# Patient Record
Sex: Female | Born: 1935 | Race: White | Hispanic: No | Smoking: Never smoker
Health system: Southern US, Community
[De-identification: ages and names within clinical notes are randomized; demographics above are authoritative.]

## PROBLEM LIST (undated history)

## (undated) DIAGNOSIS — I519 Heart disease, unspecified: Secondary | ICD-10-CM

## (undated) DIAGNOSIS — F419 Anxiety disorder, unspecified: Secondary | ICD-10-CM

## (undated) DIAGNOSIS — K8020 Calculus of gallbladder without cholecystitis without obstruction: Secondary | ICD-10-CM

## (undated) DIAGNOSIS — D649 Anemia, unspecified: Secondary | ICD-10-CM

## (undated) DIAGNOSIS — K219 Gastro-esophageal reflux disease without esophagitis: Secondary | ICD-10-CM

## (undated) DIAGNOSIS — M810 Age-related osteoporosis without current pathological fracture: Secondary | ICD-10-CM

## (undated) DIAGNOSIS — I10 Essential (primary) hypertension: Secondary | ICD-10-CM

## (undated) DIAGNOSIS — E559 Vitamin D deficiency, unspecified: Secondary | ICD-10-CM

## (undated) DIAGNOSIS — E785 Hyperlipidemia, unspecified: Secondary | ICD-10-CM

## (undated) DIAGNOSIS — G301 Alzheimer's disease with late onset: Secondary | ICD-10-CM

## (undated) DIAGNOSIS — E039 Hypothyroidism, unspecified: Secondary | ICD-10-CM

## (undated) DIAGNOSIS — K831 Obstruction of bile duct: Secondary | ICD-10-CM

## (undated) DIAGNOSIS — F0281 Dementia in other diseases classified elsewhere with behavioral disturbance: Secondary | ICD-10-CM

## (undated) DIAGNOSIS — F329 Major depressive disorder, single episode, unspecified: Secondary | ICD-10-CM

## (undated) DIAGNOSIS — F0390 Unspecified dementia without behavioral disturbance: Secondary | ICD-10-CM

## (undated) DIAGNOSIS — R42 Dizziness and giddiness: Secondary | ICD-10-CM

## (undated) DIAGNOSIS — C801 Malignant (primary) neoplasm, unspecified: Secondary | ICD-10-CM

## (undated) DIAGNOSIS — R609 Edema, unspecified: Secondary | ICD-10-CM

## (undated) HISTORY — PX: APPENDECTOMY: SHX54

## (undated) HISTORY — DX: Vitamin D deficiency, unspecified: E55.9

## (undated) HISTORY — DX: Essential (primary) hypertension: I10

## (undated) HISTORY — DX: Edema, unspecified: R60.9

## (undated) HISTORY — DX: Alzheimer's disease with late onset: G30.1

## (undated) HISTORY — DX: Dizziness and giddiness: R42

## (undated) HISTORY — DX: Unspecified dementia, unspecified severity, without behavioral disturbance, psychotic disturbance, mood disturbance, and anxiety: F03.90

## (undated) HISTORY — PX: ABDOMINAL HYSTERECTOMY: SHX81

## (undated) HISTORY — DX: Heart disease, unspecified: I51.9

## (undated) HISTORY — DX: Anemia, unspecified: D64.9

## (undated) HISTORY — PX: BREAST EXCISIONAL BIOPSY: SUR124

## (undated) HISTORY — DX: Major depressive disorder, single episode, unspecified: F32.9

## (undated) HISTORY — PX: JOINT REPLACEMENT: SHX530

## (undated) HISTORY — DX: Anxiety disorder, unspecified: F41.9

## (undated) HISTORY — DX: Hypothyroidism, unspecified: E03.9

## (undated) HISTORY — PX: CATARACT EXTRACTION: SUR2

## (undated) HISTORY — PX: OTHER SURGICAL HISTORY: SHX169

## (undated) HISTORY — DX: Age-related osteoporosis without current pathological fracture: M81.0

## (undated) HISTORY — PX: SPINE SURGERY: SHX786

## (undated) HISTORY — DX: Calculus of gallbladder without cholecystitis without obstruction: K80.20

## (undated) HISTORY — DX: Gastro-esophageal reflux disease without esophagitis: K21.9

## (undated) HISTORY — DX: Dementia in other diseases classified elsewhere with behavioral disturbance: F02.81

## (undated) HISTORY — PX: OOPHORECTOMY: SHX86

## (undated) HISTORY — DX: Hyperlipidemia, unspecified: E78.5

## (undated) HISTORY — DX: Obstruction of bile duct: K83.1

---

## 1969-08-27 HISTORY — PX: BREAST LUMPECTOMY: SHX2

## 1993-08-27 HISTORY — PX: HERNIA REPAIR: SHX51

## 2002-03-27 HISTORY — PX: OTHER SURGICAL HISTORY: SHX169

## 2004-06-08 ENCOUNTER — Ambulatory Visit: Payer: Self-pay | Admitting: Pain Medicine

## 2004-06-22 ENCOUNTER — Ambulatory Visit: Payer: Self-pay | Admitting: Physician Assistant

## 2004-07-05 ENCOUNTER — Ambulatory Visit: Payer: Self-pay | Admitting: Physician Assistant

## 2004-07-11 ENCOUNTER — Ambulatory Visit: Payer: Self-pay | Admitting: Pain Medicine

## 2004-07-26 ENCOUNTER — Ambulatory Visit: Payer: Self-pay | Admitting: Physician Assistant

## 2004-08-31 ENCOUNTER — Ambulatory Visit: Payer: Self-pay | Admitting: Pain Medicine

## 2004-09-28 ENCOUNTER — Ambulatory Visit: Payer: Self-pay | Admitting: Physician Assistant

## 2004-10-30 ENCOUNTER — Ambulatory Visit: Payer: Self-pay | Admitting: Physician Assistant

## 2004-11-15 ENCOUNTER — Ambulatory Visit: Payer: Self-pay | Admitting: Physician Assistant

## 2004-11-21 ENCOUNTER — Emergency Department: Payer: Self-pay | Admitting: Emergency Medicine

## 2004-11-21 ENCOUNTER — Ambulatory Visit: Payer: Self-pay | Admitting: Pain Medicine

## 2004-12-07 ENCOUNTER — Ambulatory Visit: Payer: Self-pay | Admitting: Physician Assistant

## 2005-02-06 ENCOUNTER — Inpatient Hospital Stay: Payer: Self-pay | Admitting: Internal Medicine

## 2005-02-06 ENCOUNTER — Other Ambulatory Visit: Payer: Self-pay

## 2005-02-28 ENCOUNTER — Ambulatory Visit: Payer: Self-pay | Admitting: Physician Assistant

## 2005-04-17 ENCOUNTER — Ambulatory Visit: Payer: Self-pay | Admitting: Urology

## 2005-06-27 ENCOUNTER — Ambulatory Visit: Payer: Self-pay | Admitting: Physician Assistant

## 2005-07-09 ENCOUNTER — Ambulatory Visit: Payer: Self-pay | Admitting: Pain Medicine

## 2005-09-11 ENCOUNTER — Ambulatory Visit: Payer: Self-pay | Admitting: Physician Assistant

## 2005-09-18 ENCOUNTER — Ambulatory Visit: Payer: Self-pay | Admitting: Pain Medicine

## 2005-10-02 ENCOUNTER — Ambulatory Visit: Payer: Self-pay | Admitting: Physician Assistant

## 2005-10-09 ENCOUNTER — Ambulatory Visit: Payer: Self-pay | Admitting: Pain Medicine

## 2005-10-29 ENCOUNTER — Ambulatory Visit: Payer: Self-pay | Admitting: Physician Assistant

## 2006-02-28 ENCOUNTER — Ambulatory Visit: Payer: Self-pay | Admitting: Physician Assistant

## 2006-03-05 ENCOUNTER — Ambulatory Visit: Payer: Self-pay | Admitting: Ophthalmology

## 2006-03-14 ENCOUNTER — Ambulatory Visit: Payer: Self-pay | Admitting: Physician Assistant

## 2006-05-24 ENCOUNTER — Ambulatory Visit: Payer: Self-pay | Admitting: Physician Assistant

## 2006-08-14 ENCOUNTER — Ambulatory Visit: Payer: Self-pay | Admitting: Pain Medicine

## 2006-09-05 ENCOUNTER — Ambulatory Visit: Payer: Self-pay | Admitting: Physician Assistant

## 2006-09-23 ENCOUNTER — Ambulatory Visit: Payer: Self-pay | Admitting: Pain Medicine

## 2006-09-24 ENCOUNTER — Ambulatory Visit: Payer: Self-pay | Admitting: Pain Medicine

## 2006-09-30 ENCOUNTER — Ambulatory Visit: Payer: Self-pay | Admitting: Pain Medicine

## 2006-10-17 ENCOUNTER — Ambulatory Visit: Payer: Self-pay | Admitting: Physician Assistant

## 2007-01-27 ENCOUNTER — Ambulatory Visit: Payer: Self-pay | Admitting: Physician Assistant

## 2007-02-26 ENCOUNTER — Ambulatory Visit: Payer: Self-pay | Admitting: Unknown Physician Specialty

## 2007-04-23 ENCOUNTER — Ambulatory Visit: Payer: Self-pay | Admitting: Physician Assistant

## 2007-05-01 ENCOUNTER — Ambulatory Visit: Payer: Self-pay | Admitting: Pain Medicine

## 2007-05-05 ENCOUNTER — Ambulatory Visit: Payer: Self-pay | Admitting: Pain Medicine

## 2007-05-06 ENCOUNTER — Ambulatory Visit: Payer: Self-pay | Admitting: Pain Medicine

## 2007-05-21 ENCOUNTER — Ambulatory Visit: Payer: Self-pay | Admitting: Physician Assistant

## 2007-06-04 ENCOUNTER — Ambulatory Visit: Payer: Self-pay | Admitting: Pain Medicine

## 2007-06-05 ENCOUNTER — Ambulatory Visit: Payer: Self-pay | Admitting: Pain Medicine

## 2007-06-24 ENCOUNTER — Ambulatory Visit: Payer: Self-pay | Admitting: Pain Medicine

## 2007-07-09 ENCOUNTER — Ambulatory Visit: Payer: Self-pay | Admitting: Pain Medicine

## 2007-09-17 ENCOUNTER — Ambulatory Visit: Payer: Self-pay | Admitting: Physician Assistant

## 2007-10-06 ENCOUNTER — Ambulatory Visit: Payer: Self-pay | Admitting: Physician Assistant

## 2008-01-12 ENCOUNTER — Ambulatory Visit: Payer: Self-pay | Admitting: Pain Medicine

## 2008-03-09 ENCOUNTER — Ambulatory Visit: Payer: Self-pay | Admitting: Unknown Physician Specialty

## 2008-03-09 ENCOUNTER — Other Ambulatory Visit: Payer: Self-pay

## 2008-03-17 ENCOUNTER — Inpatient Hospital Stay: Payer: Self-pay | Admitting: Unknown Physician Specialty

## 2008-05-26 ENCOUNTER — Inpatient Hospital Stay: Payer: Self-pay | Admitting: Internal Medicine

## 2008-05-26 ENCOUNTER — Other Ambulatory Visit: Payer: Self-pay

## 2008-06-14 ENCOUNTER — Ambulatory Visit: Payer: Self-pay | Admitting: Unknown Physician Specialty

## 2008-07-16 ENCOUNTER — Inpatient Hospital Stay: Payer: Self-pay | Admitting: Unknown Physician Specialty

## 2008-11-30 ENCOUNTER — Ambulatory Visit: Payer: Self-pay | Admitting: Internal Medicine

## 2010-02-21 ENCOUNTER — Ambulatory Visit: Payer: Self-pay

## 2011-01-12 ENCOUNTER — Ambulatory Visit: Payer: Self-pay | Admitting: Anesthesiology

## 2012-02-21 ENCOUNTER — Ambulatory Visit: Payer: Self-pay | Admitting: Internal Medicine

## 2013-02-23 ENCOUNTER — Emergency Department: Payer: Self-pay | Admitting: Emergency Medicine

## 2013-02-23 LAB — CBC
HCT: 34.8 % — ABNORMAL LOW (ref 35.0–47.0)
HGB: 11.6 g/dL — ABNORMAL LOW (ref 12.0–16.0)
MCH: 31.2 pg (ref 26.0–34.0)
MCV: 94 fL (ref 80–100)
RBC: 3.7 10*6/uL — ABNORMAL LOW (ref 3.80–5.20)
WBC: 6.6 10*3/uL (ref 3.6–11.0)

## 2013-02-23 LAB — COMPREHENSIVE METABOLIC PANEL
Albumin: 3.4 g/dL (ref 3.4–5.0)
BUN: 23 mg/dL — ABNORMAL HIGH (ref 7–18)
Calcium, Total: 8.6 mg/dL (ref 8.5–10.1)
Creatinine: 1.01 mg/dL (ref 0.60–1.30)
EGFR (Non-African Amer.): 50 — ABNORMAL LOW
Glucose: 97 mg/dL (ref 65–99)
Osmolality: 292 (ref 275–301)
Potassium: 4.8 mmol/L (ref 3.5–5.1)
SGOT(AST): 16 U/L (ref 15–37)
SGPT (ALT): 14 U/L (ref 12–78)
Sodium: 145 mmol/L (ref 136–145)

## 2013-02-23 LAB — SALICYLATE LEVEL: Salicylates, Serum: <1.7 mg/dL

## 2013-02-23 LAB — ETHANOL
Ethanol %: <0.003 % (ref 0.000–0.080)
Ethanol: <3 mg/dL

## 2013-02-23 LAB — TSH: Thyroid Stimulating Horm: 1.24 u[IU]/mL

## 2013-02-24 LAB — URINALYSIS, COMPLETE
Bacteria: NONE SEEN
Bilirubin,UR: NEGATIVE
Glucose,UR: NEGATIVE mg/dL (ref 0–75)
Ketone: NEGATIVE
Nitrite: NEGATIVE
Ph: 5 (ref 4.5–8.0)
Protein: NEGATIVE
RBC,UR: 5 /HPF (ref 0–5)
WBC UR: 22 /HPF (ref 0–5)

## 2013-02-24 LAB — DRUG SCREEN, URINE
Barbiturates, Ur Screen: NEGATIVE (ref ?–200)
Cocaine Metabolite,Ur ~~LOC~~: NEGATIVE (ref ?–300)
MDMA (Ecstasy)Ur Screen: NEGATIVE (ref ?–500)
Opiate, Ur Screen: POSITIVE (ref ?–300)
Phencyclidine (PCP) Ur S: NEGATIVE (ref ?–25)
Tricyclic, Ur Screen: NEGATIVE (ref ?–1000)

## 2014-03-02 DIAGNOSIS — R609 Edema, unspecified: Secondary | ICD-10-CM | POA: Insufficient documentation

## 2014-03-02 DIAGNOSIS — R42 Dizziness and giddiness: Secondary | ICD-10-CM

## 2014-03-02 HISTORY — DX: Dizziness and giddiness: R42

## 2014-03-02 HISTORY — DX: Edema, unspecified: R60.9

## 2014-12-17 NOTE — Consult Note (Signed)
PATIENT NAME:  Kerri Rangel, Kerri Rangel MR#:  [id number] DATE OF BIRTH:  1924-08-01  DATE OF CONSULTATION:  02/24/2013  REFERRING PHYSICIAN:  Lavonia Drafts, MD CONSULTING PHYSICIAN:  Cordelia Pen. Gretel Acre, MD  REASON FOR ADMISSION:  Crying with eyes closed.   HISTORY OF PRESENT ILLNESS: The patient is an 81 year old recently widowed Caucasian female who presented to the ED with her 2 adult children. The patient has been severely depressed and was crying and sobbing as she lost her husband recently, 6 weeks ago, and after that her daughter passed away 2 days ago. The patient is still grieving and has been sick for a while. The patient was accompanied by her family members including her son and 2 granddaughters. She was evaluated in the presence of her son. Initial history was obtained from her son. He reported that the patient was very sad and was crying yesterday after the death of her daughter. She was present and kissed her goodbye, but after that she was crying and yelling and she locked herself in the room. She locked all the doors and refused to come out and they had to call the law to open the doors. She flipped out and going around the house and was telling that nobody told her about her daughter dying.  The patient was yelling and was crying out loud and was uncontrollable. The patient was also calling for her deceased husband.   During my interview, the patient continued to be crying and was telling that she wants to die and she wants to be with her daughter and with her husband. She reported that she does not want to live anymore. She stated that she is feeling very sad and depressed at this time. Her son reported that the patient has been feeling very depressed for the past few weeks and she has lost her appetite and she has not been eating well.  Her granddaughters are also concerned about her state of health. They reported that she was started on medications by her primary care physician, Dr. Fannie Knee, where she has been noncompliant with the medications. She is not currently improving and they want her to be admitted to a psychiatric hospital at this time.   PAST PSYCHIATRIC HISTORY: They reported that the patient was admitted to the hospital in [place] when she was young, in the 80s.  They do not know what medication she was prescribed at that time. The patient is currently taking sertraline, Seroquel and alprazolam. She is not improving on the current medication. The patient continued to report that she is having thoughts to hurt herself and she wants to die and she wants to be with her husband.   CURRENT HOME MEDICATIONS: 1.  Vicodin 1 tablet every 8 hours as needed for pain. 2.  Simvastatin 40 mg daily. 3.  Omeprazole 20 mg at bedtime.  4.  Atenolol 50 mg daily. 5.  Meloxicam 7.5 mg every 12 hours. 6.  Seroquel 12.5 mg daily. 7.  Alprazolam 0.5 mg p.o. b.i.d.  8.  Levothyroxine 25 mg daily. 9.  Sertraline 100 mg daily.  MEDICAL HISTORY:  Hypercholesterolemia, GERD, hypertension and hypothyroidism.   ALLERGIES: MORPHINE, ZANAFLEX.   SOCIAL HISTORY: The patient recently lost her husband as well as her older daughter. She has 2 other children. She is very close to the family. She currently lives by herself, but her daughter spends 6 nights with her as well as her son spends 1 night with her. Her son reported that  her daughter is also very stressed out because she is spending 6 night with the mother and then she has to take care of her family. They are considering placing her in independent living. Her daughter has the power of attorney for the patient. The patient has never attempted suicide in the past.   ANCILLARY DATA:  Temperature 98.4, pulse 71, respirations 20, blood pressure 179/74.   LABORATORY DATA:  Glucose 97, BUN 23, creatinine 1.01, sodium 145, potassium 4.8, chloride 114, bicarbonate 26, anion gap 5, osmolality 292, calcium 8.6. Blood alcohol level less than 3.  Protein 6.5, albumin 3.4, bilirubin 0.4, alkaline phosphatase 59, AST 16, ALT 14. TSH 1.24. Urine drug screen positive for benzodiazepine and opioids.  RBC 3.7, hemoglobin 11.6, hematocrit 34.8.   REVIEW OF SYSTEMS:  GENERAL:  She denied having any pains. The patient was constantly crying. No cough noted.  GENITOURINARY:  No incontinence noted.   MENTAL STATUS EXAMINATION:  The patient is an elderly looking female who appeared her stated age. She was crying and sobbing constantly. She maintained poor eye contact. Her speech was low in tone and volume. Mood was depressed and anxious. Affect was congruent. Thought process was logical. Thought content was nondelusional. She demonstrated poor insight and judgment. She was saying that she is going to kill herself and wants to be with her husband and her deceased daughter.   DIAGNOSTIC IMPRESSION: AXIS I:  1.  Major depressive disorder, severe, without psychotic features.  2.  Bereavement. AXIS II: None.  AXIS III: Please review the medical history.   TREATMENT PLAN: The patient will be started on the following medications: 1.  Sertraline 100 mg p.o. q. a.m.  2.  Seroquel 25 mg p.o. b.i.d.  3.  Xanax 0.25 mg p.o. at bedtime.   She will be referred to the inpatient gero-psych unit for admission if the bed will be available. I also discussed with the family about starting her on therapy for bereavement as she is going through the grief process and we all agreed with the plan. They will also look into placement for independent living at this time.   Thank you for allowing me to participate in the care of this patient.  ____________________________ Cordelia Pen. Gretel Acre, MD usf:sb D: 02/24/2013 13:57:25 ET T: 02/24/2013 14:29:28 ET JOB#: [id number]  cc: Cordelia Pen. Gretel Acre, MD, <Dictator> Jeronimo Norma MD ELECTRONICALLY SIGNED 03/05/2013 9:54

## 2014-12-23 ENCOUNTER — Emergency Department: Admit: 2014-12-23 | Disposition: A | Discharge status: Home / Self Care | Payer: Self-pay | Admitting: Internal Medicine

## 2014-12-23 LAB — URINALYSIS, COMPLETE
BILIRUBIN, UR: NEGATIVE
GLUCOSE, UR: NEGATIVE mg/dL (ref 0–75)
Ketone: NEGATIVE
Nitrite: POSITIVE
PH: 6 (ref 4.5–8.0)
Protein: NEGATIVE
RBC,UR: NONE SEEN /HPF (ref 0–5)
SPECIFIC GRAVITY: 1.009 (ref 1.003–1.030)

## 2014-12-23 LAB — COMPREHENSIVE METABOLIC PANEL
ALBUMIN: 3.6 g/dL
ANION GAP: 8 (ref 7–16)
AST: 24 U/L
Alkaline Phosphatase: 62 U/L
BUN: 16 mg/dL
Bilirubin,Total: 0.4 mg/dL
CALCIUM: 9.1 mg/dL
CHLORIDE: 109 mmol/L
Co2: 26 mmol/L
Creatinine: 0.94 mg/dL
EGFR (African American): >60
EGFR (Non-African Amer.): 54 — ABNORMAL LOW
GLUCOSE: 125 mg/dL — AB
Potassium: 4.2 mmol/L
SGPT (ALT): 10 U/L — ABNORMAL LOW
Sodium: 143 mmol/L
Total Protein: 6.4 g/dL — ABNORMAL LOW

## 2014-12-23 LAB — CBC
HCT: 39.3 % (ref 35.0–47.0)
HGB: 12.7 g/dL (ref 12.0–16.0)
MCH: 29.9 pg (ref 26.0–34.0)
MCHC: 32.2 g/dL (ref 32.0–36.0)
MCV: 93 fL (ref 80–100)
Platelet: 184 10*3/uL (ref 150–440)
RBC: 4.24 10*6/uL (ref 3.80–5.20)
RDW: 13.2 % (ref 11.5–14.5)
WBC: 6.2 10*3/uL (ref 3.6–11.0)

## 2014-12-23 LAB — TROPONIN I

## 2015-03-02 DIAGNOSIS — G301 Alzheimer's disease with late onset: Secondary | ICD-10-CM

## 2015-03-02 DIAGNOSIS — F0281 Dementia in other diseases classified elsewhere with behavioral disturbance: Secondary | ICD-10-CM

## 2015-03-02 DIAGNOSIS — F02818 Dementia in other diseases classified elsewhere, unspecified severity, with other behavioral disturbance: Secondary | ICD-10-CM

## 2015-03-02 HISTORY — DX: Alzheimer's disease with late onset: G30.1

## 2015-03-02 HISTORY — DX: Dementia in other diseases classified elsewhere with behavioral disturbance: F02.81

## 2015-03-02 HISTORY — DX: Dementia in other diseases classified elsewhere, unspecified severity, with other behavioral disturbance: F02.818

## 2015-03-04 ENCOUNTER — Other Ambulatory Visit: Payer: Self-pay | Admitting: Neurology

## 2015-03-04 DIAGNOSIS — R413 Other amnesia: Secondary | ICD-10-CM

## 2015-03-08 ENCOUNTER — Ambulatory Visit
Admission: RE | Admit: 2015-03-08 | Discharge: 2015-03-08 | Disposition: A | Discharge status: Home / Self Care | Payer: PPO | Source: Ambulatory Visit | Attending: Neurology | Admitting: Neurology

## 2015-03-08 DIAGNOSIS — R413 Other amnesia: Secondary | ICD-10-CM | POA: Insufficient documentation

## 2015-09-13 DIAGNOSIS — E785 Hyperlipidemia, unspecified: Secondary | ICD-10-CM | POA: Diagnosis not present

## 2015-09-13 DIAGNOSIS — I159 Secondary hypertension, unspecified: Secondary | ICD-10-CM | POA: Diagnosis not present

## 2015-09-13 DIAGNOSIS — E039 Hypothyroidism, unspecified: Secondary | ICD-10-CM | POA: Diagnosis not present

## 2015-09-19 ENCOUNTER — Other Ambulatory Visit: Payer: Self-pay | Admitting: Anesthesiology

## 2015-09-19 DIAGNOSIS — Z79899 Other long term (current) drug therapy: Secondary | ICD-10-CM | POA: Diagnosis not present

## 2015-09-19 DIAGNOSIS — J449 Chronic obstructive pulmonary disease, unspecified: Secondary | ICD-10-CM | POA: Diagnosis not present

## 2015-09-19 DIAGNOSIS — M79674 Pain in right toe(s): Secondary | ICD-10-CM | POA: Diagnosis not present

## 2015-09-19 DIAGNOSIS — F1120 Opioid dependence, uncomplicated: Secondary | ICD-10-CM | POA: Diagnosis not present

## 2015-09-19 DIAGNOSIS — R5383 Other fatigue: Secondary | ICD-10-CM | POA: Diagnosis not present

## 2015-09-19 DIAGNOSIS — F1920 Other psychoactive substance dependence, uncomplicated: Secondary | ICD-10-CM | POA: Diagnosis not present

## 2015-09-19 DIAGNOSIS — B351 Tinea unguium: Secondary | ICD-10-CM | POA: Diagnosis not present

## 2015-09-19 DIAGNOSIS — M5417 Radiculopathy, lumbosacral region: Secondary | ICD-10-CM | POA: Diagnosis not present

## 2015-09-19 DIAGNOSIS — M79675 Pain in left toe(s): Secondary | ICD-10-CM | POA: Diagnosis not present

## 2015-09-19 DIAGNOSIS — F329 Major depressive disorder, single episode, unspecified: Secondary | ICD-10-CM | POA: Diagnosis not present

## 2015-09-19 DIAGNOSIS — M47819 Spondylosis without myelopathy or radiculopathy, site unspecified: Secondary | ICD-10-CM

## 2015-09-19 DIAGNOSIS — M545 Low back pain: Secondary | ICD-10-CM | POA: Diagnosis not present

## 2015-09-19 DIAGNOSIS — G8929 Other chronic pain: Secondary | ICD-10-CM | POA: Diagnosis not present

## 2015-09-19 DIAGNOSIS — I10 Essential (primary) hypertension: Secondary | ICD-10-CM | POA: Diagnosis not present

## 2015-09-19 DIAGNOSIS — F4310 Post-traumatic stress disorder, unspecified: Secondary | ICD-10-CM | POA: Diagnosis not present

## 2015-09-20 DIAGNOSIS — G301 Alzheimer's disease with late onset: Secondary | ICD-10-CM | POA: Diagnosis not present

## 2015-09-20 DIAGNOSIS — R739 Hyperglycemia, unspecified: Secondary | ICD-10-CM | POA: Diagnosis not present

## 2015-09-20 DIAGNOSIS — F332 Major depressive disorder, recurrent severe without psychotic features: Secondary | ICD-10-CM | POA: Diagnosis not present

## 2015-09-20 DIAGNOSIS — R5382 Chronic fatigue, unspecified: Secondary | ICD-10-CM | POA: Diagnosis not present

## 2015-09-20 DIAGNOSIS — F0281 Dementia in other diseases classified elsewhere with behavioral disturbance: Secondary | ICD-10-CM | POA: Diagnosis not present

## 2015-09-20 DIAGNOSIS — E039 Hypothyroidism, unspecified: Secondary | ICD-10-CM | POA: Diagnosis not present

## 2015-09-22 ENCOUNTER — Ambulatory Visit: Payer: PPO

## 2015-09-28 ENCOUNTER — Ambulatory Visit: Admission: RE | Admit: 2015-09-28 | Payer: PPO | Source: Ambulatory Visit

## 2015-10-07 ENCOUNTER — Ambulatory Visit
Admission: RE | Admit: 2015-10-07 | Discharge: 2015-10-07 | Disposition: A | Discharge status: Home / Self Care | Payer: PPO | Source: Ambulatory Visit | Attending: Anesthesiology | Admitting: Anesthesiology

## 2015-10-07 DIAGNOSIS — M4806 Spinal stenosis, lumbar region: Secondary | ICD-10-CM | POA: Insufficient documentation

## 2015-10-07 DIAGNOSIS — M47819 Spondylosis without myelopathy or radiculopathy, site unspecified: Secondary | ICD-10-CM

## 2015-10-07 DIAGNOSIS — M4690 Unspecified inflammatory spondylopathy, site unspecified: Secondary | ICD-10-CM | POA: Diagnosis not present

## 2015-10-07 DIAGNOSIS — M5136 Other intervertebral disc degeneration, lumbar region: Secondary | ICD-10-CM | POA: Diagnosis not present

## 2015-11-04 DIAGNOSIS — E785 Hyperlipidemia, unspecified: Secondary | ICD-10-CM | POA: Diagnosis not present

## 2015-11-04 DIAGNOSIS — I10 Essential (primary) hypertension: Secondary | ICD-10-CM | POA: Diagnosis not present

## 2015-11-04 DIAGNOSIS — F329 Major depressive disorder, single episode, unspecified: Secondary | ICD-10-CM | POA: Diagnosis not present

## 2015-11-04 DIAGNOSIS — I6523 Occlusion and stenosis of bilateral carotid arteries: Secondary | ICD-10-CM | POA: Diagnosis not present

## 2015-11-21 DIAGNOSIS — F1120 Opioid dependence, uncomplicated: Secondary | ICD-10-CM | POA: Diagnosis not present

## 2015-11-21 DIAGNOSIS — G8929 Other chronic pain: Secondary | ICD-10-CM | POA: Diagnosis not present

## 2015-11-21 DIAGNOSIS — J449 Chronic obstructive pulmonary disease, unspecified: Secondary | ICD-10-CM | POA: Diagnosis not present

## 2015-11-21 DIAGNOSIS — R5383 Other fatigue: Secondary | ICD-10-CM | POA: Diagnosis not present

## 2015-11-21 DIAGNOSIS — F329 Major depressive disorder, single episode, unspecified: Secondary | ICD-10-CM | POA: Diagnosis not present

## 2015-11-21 DIAGNOSIS — M5417 Radiculopathy, lumbosacral region: Secondary | ICD-10-CM | POA: Diagnosis not present

## 2015-11-21 DIAGNOSIS — F1920 Other psychoactive substance dependence, uncomplicated: Secondary | ICD-10-CM | POA: Diagnosis not present

## 2015-11-21 DIAGNOSIS — I10 Essential (primary) hypertension: Secondary | ICD-10-CM | POA: Diagnosis not present

## 2015-11-21 DIAGNOSIS — F4310 Post-traumatic stress disorder, unspecified: Secondary | ICD-10-CM | POA: Diagnosis not present

## 2015-11-21 DIAGNOSIS — M545 Low back pain: Secondary | ICD-10-CM | POA: Diagnosis not present

## 2015-12-20 DIAGNOSIS — B351 Tinea unguium: Secondary | ICD-10-CM | POA: Diagnosis not present

## 2015-12-20 DIAGNOSIS — M79675 Pain in left toe(s): Secondary | ICD-10-CM | POA: Diagnosis not present

## 2015-12-20 DIAGNOSIS — M79674 Pain in right toe(s): Secondary | ICD-10-CM | POA: Diagnosis not present

## 2016-01-16 DIAGNOSIS — J449 Chronic obstructive pulmonary disease, unspecified: Secondary | ICD-10-CM | POA: Diagnosis not present

## 2016-01-16 DIAGNOSIS — R5383 Other fatigue: Secondary | ICD-10-CM | POA: Diagnosis not present

## 2016-01-16 DIAGNOSIS — M5417 Radiculopathy, lumbosacral region: Secondary | ICD-10-CM | POA: Diagnosis not present

## 2016-01-16 DIAGNOSIS — F4310 Post-traumatic stress disorder, unspecified: Secondary | ICD-10-CM | POA: Diagnosis not present

## 2016-01-16 DIAGNOSIS — M545 Low back pain: Secondary | ICD-10-CM | POA: Diagnosis not present

## 2016-01-16 DIAGNOSIS — F1920 Other psychoactive substance dependence, uncomplicated: Secondary | ICD-10-CM | POA: Diagnosis not present

## 2016-01-16 DIAGNOSIS — F329 Major depressive disorder, single episode, unspecified: Secondary | ICD-10-CM | POA: Diagnosis not present

## 2016-01-16 DIAGNOSIS — I10 Essential (primary) hypertension: Secondary | ICD-10-CM | POA: Diagnosis not present

## 2016-01-26 DIAGNOSIS — R400 Somnolence: Secondary | ICD-10-CM | POA: Diagnosis not present

## 2016-03-02 DIAGNOSIS — E785 Hyperlipidemia, unspecified: Secondary | ICD-10-CM | POA: Diagnosis not present

## 2016-03-02 DIAGNOSIS — E039 Hypothyroidism, unspecified: Secondary | ICD-10-CM | POA: Diagnosis not present

## 2016-03-02 DIAGNOSIS — F0281 Dementia in other diseases classified elsewhere with behavioral disturbance: Secondary | ICD-10-CM | POA: Diagnosis not present

## 2016-03-02 DIAGNOSIS — I159 Secondary hypertension, unspecified: Secondary | ICD-10-CM | POA: Diagnosis not present

## 2016-03-02 DIAGNOSIS — G301 Alzheimer's disease with late onset: Secondary | ICD-10-CM | POA: Diagnosis not present

## 2016-03-12 DIAGNOSIS — I10 Essential (primary) hypertension: Secondary | ICD-10-CM | POA: Diagnosis not present

## 2016-03-12 DIAGNOSIS — F1920 Other psychoactive substance dependence, uncomplicated: Secondary | ICD-10-CM | POA: Diagnosis not present

## 2016-03-12 DIAGNOSIS — R5383 Other fatigue: Secondary | ICD-10-CM | POA: Diagnosis not present

## 2016-03-12 DIAGNOSIS — F4310 Post-traumatic stress disorder, unspecified: Secondary | ICD-10-CM | POA: Diagnosis not present

## 2016-03-12 DIAGNOSIS — Z79899 Other long term (current) drug therapy: Secondary | ICD-10-CM | POA: Diagnosis not present

## 2016-03-12 DIAGNOSIS — J449 Chronic obstructive pulmonary disease, unspecified: Secondary | ICD-10-CM | POA: Diagnosis not present

## 2016-03-12 DIAGNOSIS — F329 Major depressive disorder, single episode, unspecified: Secondary | ICD-10-CM | POA: Diagnosis not present

## 2016-03-12 DIAGNOSIS — Z79891 Long term (current) use of opiate analgesic: Secondary | ICD-10-CM | POA: Diagnosis not present

## 2016-03-12 DIAGNOSIS — M545 Low back pain: Secondary | ICD-10-CM | POA: Diagnosis not present

## 2016-03-12 DIAGNOSIS — M5417 Radiculopathy, lumbosacral region: Secondary | ICD-10-CM | POA: Diagnosis not present

## 2016-03-12 DIAGNOSIS — F1120 Opioid dependence, uncomplicated: Secondary | ICD-10-CM | POA: Diagnosis not present

## 2016-03-30 DIAGNOSIS — E039 Hypothyroidism, unspecified: Secondary | ICD-10-CM | POA: Diagnosis not present

## 2016-03-30 DIAGNOSIS — I159 Secondary hypertension, unspecified: Secondary | ICD-10-CM | POA: Diagnosis not present

## 2016-03-30 DIAGNOSIS — E785 Hyperlipidemia, unspecified: Secondary | ICD-10-CM | POA: Diagnosis not present

## 2016-03-30 DIAGNOSIS — G301 Alzheimer's disease with late onset: Secondary | ICD-10-CM | POA: Diagnosis not present

## 2016-03-30 DIAGNOSIS — F0281 Dementia in other diseases classified elsewhere with behavioral disturbance: Secondary | ICD-10-CM | POA: Diagnosis not present

## 2016-04-10 DIAGNOSIS — I159 Secondary hypertension, unspecified: Secondary | ICD-10-CM | POA: Diagnosis not present

## 2016-04-10 DIAGNOSIS — F332 Major depressive disorder, recurrent severe without psychotic features: Secondary | ICD-10-CM | POA: Diagnosis not present

## 2016-04-10 DIAGNOSIS — G301 Alzheimer's disease with late onset: Secondary | ICD-10-CM | POA: Diagnosis not present

## 2016-04-10 DIAGNOSIS — E039 Hypothyroidism, unspecified: Secondary | ICD-10-CM | POA: Diagnosis not present

## 2016-04-10 DIAGNOSIS — F0281 Dementia in other diseases classified elsewhere with behavioral disturbance: Secondary | ICD-10-CM | POA: Diagnosis not present

## 2016-04-10 DIAGNOSIS — F419 Anxiety disorder, unspecified: Secondary | ICD-10-CM | POA: Diagnosis not present

## 2016-04-11 DIAGNOSIS — Z961 Presence of intraocular lens: Secondary | ICD-10-CM | POA: Diagnosis not present

## 2016-05-07 DIAGNOSIS — R5383 Other fatigue: Secondary | ICD-10-CM | POA: Diagnosis not present

## 2016-05-07 DIAGNOSIS — J449 Chronic obstructive pulmonary disease, unspecified: Secondary | ICD-10-CM | POA: Diagnosis not present

## 2016-05-07 DIAGNOSIS — F4310 Post-traumatic stress disorder, unspecified: Secondary | ICD-10-CM | POA: Diagnosis not present

## 2016-05-07 DIAGNOSIS — I10 Essential (primary) hypertension: Secondary | ICD-10-CM | POA: Diagnosis not present

## 2016-05-07 DIAGNOSIS — F1920 Other psychoactive substance dependence, uncomplicated: Secondary | ICD-10-CM | POA: Diagnosis not present

## 2016-05-07 DIAGNOSIS — M5417 Radiculopathy, lumbosacral region: Secondary | ICD-10-CM | POA: Diagnosis not present

## 2016-05-07 DIAGNOSIS — M545 Low back pain: Secondary | ICD-10-CM | POA: Diagnosis not present

## 2016-05-07 DIAGNOSIS — F329 Major depressive disorder, single episode, unspecified: Secondary | ICD-10-CM | POA: Diagnosis not present

## 2016-05-21 DIAGNOSIS — R05 Cough: Secondary | ICD-10-CM | POA: Diagnosis not present

## 2016-06-20 DIAGNOSIS — M79674 Pain in right toe(s): Secondary | ICD-10-CM | POA: Diagnosis not present

## 2016-06-20 DIAGNOSIS — B351 Tinea unguium: Secondary | ICD-10-CM | POA: Diagnosis not present

## 2016-06-20 DIAGNOSIS — M79675 Pain in left toe(s): Secondary | ICD-10-CM | POA: Diagnosis not present

## 2016-06-29 DIAGNOSIS — G301 Alzheimer's disease with late onset: Secondary | ICD-10-CM | POA: Diagnosis not present

## 2016-06-29 DIAGNOSIS — F0281 Dementia in other diseases classified elsewhere with behavioral disturbance: Secondary | ICD-10-CM | POA: Diagnosis not present

## 2016-07-02 DIAGNOSIS — F1920 Other psychoactive substance dependence, uncomplicated: Secondary | ICD-10-CM | POA: Diagnosis not present

## 2016-07-02 DIAGNOSIS — I10 Essential (primary) hypertension: Secondary | ICD-10-CM | POA: Diagnosis not present

## 2016-07-02 DIAGNOSIS — J449 Chronic obstructive pulmonary disease, unspecified: Secondary | ICD-10-CM | POA: Diagnosis not present

## 2016-07-02 DIAGNOSIS — F329 Major depressive disorder, single episode, unspecified: Secondary | ICD-10-CM | POA: Diagnosis not present

## 2016-07-02 DIAGNOSIS — M5417 Radiculopathy, lumbosacral region: Secondary | ICD-10-CM | POA: Diagnosis not present

## 2016-07-02 DIAGNOSIS — R5383 Other fatigue: Secondary | ICD-10-CM | POA: Diagnosis not present

## 2016-07-02 DIAGNOSIS — F4310 Post-traumatic stress disorder, unspecified: Secondary | ICD-10-CM | POA: Diagnosis not present

## 2016-07-02 DIAGNOSIS — M545 Low back pain: Secondary | ICD-10-CM | POA: Diagnosis not present

## 2016-07-10 DIAGNOSIS — R109 Unspecified abdominal pain: Secondary | ICD-10-CM | POA: Diagnosis not present

## 2016-07-10 DIAGNOSIS — G301 Alzheimer's disease with late onset: Secondary | ICD-10-CM | POA: Diagnosis not present

## 2016-07-10 DIAGNOSIS — I10 Essential (primary) hypertension: Secondary | ICD-10-CM | POA: Diagnosis not present

## 2016-07-10 DIAGNOSIS — N390 Urinary tract infection, site not specified: Secondary | ICD-10-CM | POA: Diagnosis not present

## 2016-07-10 DIAGNOSIS — F0281 Dementia in other diseases classified elsewhere with behavioral disturbance: Secondary | ICD-10-CM | POA: Diagnosis not present

## 2016-07-18 DIAGNOSIS — R109 Unspecified abdominal pain: Secondary | ICD-10-CM | POA: Diagnosis not present

## 2016-07-18 DIAGNOSIS — I10 Essential (primary) hypertension: Secondary | ICD-10-CM | POA: Diagnosis not present

## 2016-07-24 ENCOUNTER — Other Ambulatory Visit: Payer: Self-pay | Admitting: Infectious Diseases

## 2016-07-24 DIAGNOSIS — R1084 Generalized abdominal pain: Secondary | ICD-10-CM | POA: Diagnosis not present

## 2016-07-24 DIAGNOSIS — N390 Urinary tract infection, site not specified: Secondary | ICD-10-CM | POA: Diagnosis not present

## 2016-07-26 ENCOUNTER — Ambulatory Visit
Admission: RE | Admit: 2016-07-26 | Discharge: 2016-07-26 | Disposition: A | Discharge status: Home / Self Care | Payer: PPO | Source: Ambulatory Visit | Attending: Infectious Diseases | Admitting: Infectious Diseases

## 2016-07-26 DIAGNOSIS — I700 Atherosclerosis of aorta: Secondary | ICD-10-CM | POA: Insufficient documentation

## 2016-07-26 DIAGNOSIS — E279 Disorder of adrenal gland, unspecified: Secondary | ICD-10-CM | POA: Insufficient documentation

## 2016-07-26 DIAGNOSIS — N390 Urinary tract infection, site not specified: Secondary | ICD-10-CM | POA: Insufficient documentation

## 2016-07-26 DIAGNOSIS — K8011 Calculus of gallbladder with chronic cholecystitis with obstruction: Secondary | ICD-10-CM | POA: Diagnosis not present

## 2016-07-26 DIAGNOSIS — R1084 Generalized abdominal pain: Secondary | ICD-10-CM

## 2016-07-26 DIAGNOSIS — C189 Malignant neoplasm of colon, unspecified: Secondary | ICD-10-CM | POA: Diagnosis not present

## 2016-07-26 HISTORY — DX: Malignant (primary) neoplasm, unspecified: C80.1

## 2016-07-26 MED ORDER — IOPAMIDOL (ISOVUE-300) INJECTION 61%
75.0000 mL | Freq: Once | INTRAVENOUS | Status: AC | PRN
  Administered 2016-07-26: 75 mL via INTRAVENOUS

## 2016-08-01 DIAGNOSIS — K8020 Calculus of gallbladder without cholecystitis without obstruction: Secondary | ICD-10-CM

## 2016-08-01 DIAGNOSIS — K831 Obstruction of bile duct: Secondary | ICD-10-CM

## 2016-08-01 HISTORY — DX: Calculus of gallbladder without cholecystitis without obstruction: K80.20

## 2016-08-01 HISTORY — DX: Obstruction of bile duct: K83.1

## 2016-08-07 ENCOUNTER — Other Ambulatory Visit: Payer: Self-pay

## 2016-08-07 ENCOUNTER — Ambulatory Visit (INDEPENDENT_AMBULATORY_CARE_PROVIDER_SITE_OTHER): Payer: PPO | Admitting: Gastroenterology

## 2016-08-07 ENCOUNTER — Encounter: Payer: Self-pay | Admitting: Gastroenterology

## 2016-08-07 VITALS — BP 156/71 | HR 65 | Temp 97.6°F | Ht 63.0 in | Wt 158.0 lb

## 2016-08-07 DIAGNOSIS — K8050 Calculus of bile duct without cholangitis or cholecystitis without obstruction: Secondary | ICD-10-CM

## 2016-08-07 DIAGNOSIS — F419 Anxiety disorder, unspecified: Secondary | ICD-10-CM

## 2016-08-07 DIAGNOSIS — I10 Essential (primary) hypertension: Secondary | ICD-10-CM

## 2016-08-07 DIAGNOSIS — F32A Depression, unspecified: Secondary | ICD-10-CM

## 2016-08-07 DIAGNOSIS — F329 Major depressive disorder, single episode, unspecified: Secondary | ICD-10-CM | POA: Insufficient documentation

## 2016-08-07 DIAGNOSIS — F0390 Unspecified dementia without behavioral disturbance: Secondary | ICD-10-CM | POA: Insufficient documentation

## 2016-08-07 DIAGNOSIS — R110 Nausea: Secondary | ICD-10-CM

## 2016-08-07 DIAGNOSIS — E559 Vitamin D deficiency, unspecified: Secondary | ICD-10-CM | POA: Insufficient documentation

## 2016-08-07 DIAGNOSIS — E039 Hypothyroidism, unspecified: Secondary | ICD-10-CM | POA: Insufficient documentation

## 2016-08-07 DIAGNOSIS — E785 Hyperlipidemia, unspecified: Secondary | ICD-10-CM | POA: Insufficient documentation

## 2016-08-07 HISTORY — DX: Hyperlipidemia, unspecified: E78.5

## 2016-08-07 HISTORY — DX: Essential (primary) hypertension: I10

## 2016-08-07 HISTORY — DX: Anxiety disorder, unspecified: F41.9

## 2016-08-07 HISTORY — DX: Depression, unspecified: F32.A

## 2016-08-07 HISTORY — DX: Hypothyroidism, unspecified: E03.9

## 2016-08-07 NOTE — Progress Notes (Signed)
Gastroenterology Consultation  Referring Provider:     Leonel Ramsay, MD Primary Care Physician:  Leonel Ramsay, MD Primary Gastroenterologist:  Dr. Allen Norris     Reason for Consultation:     Common bile duct stones        HPI:   Kerri Rangel is a 82 y.o. y/o female referred for consultation & management of Common bile duct stones by Dr. Ola Spurr, DAVID Mamie Nick, MD.  This patient comes to me from Dr. Vira Agar after the patient was to him by Dr. Ola Spurr for common Bile duct stones.  The patient and her children come today with a report of the patient having some nausea. The patient has some dementia and is not able to give much history.  The patient's daughter states that she has been gaining weight despite having nausea and she is on pain medication for her back.  The patient was having increased abdominal pain when the family try to wean the patient off of her back pain medication.  Shortly after that the patient started to have worsening of her abdominal pain.  When they went back up on the pain medication the patient stopped reporting any further abdominal pain but still has some nausea.  Past Medical History:  Diagnosis Date  . Anemia   . Anxiety 08/07/2016  . Cancer ([place])    colon ca  . Common bile duct (CBD) obstruction 08/01/2016   Overview:  Possible stone noted 07/26/16 - refer to GI. LFTs nml but does have chronic abd pain   . Dementia   . Depression 08/07/2016  . Dizzy 03/02/2014  . Edema 03/02/2014  . Gallstone 08/01/2016  . GERD (gastroesophageal reflux disease)   . Heart disease   . Hyperlipidemia, unspecified 08/07/2016  . Hypertension 08/07/2016  . Hypothyroidism 08/07/2016  . Late onset Alzheimer's disease with behavioral disturbance 03/02/2015  . Osteoporosis   . Vitamin D deficiency     Past Surgical History:  Procedure Laterality Date  . ABDOMINAL HYSTERECTOMY    . APPENDECTOMY    . Back sugery  03/2002   lumbar disk and fusion. Dr. Mauri Pole   . BREAST  EXCISIONAL BIOPSY     Benign. Dr. Pat Patrick  . CATARACT EXTRACTION Bilateral   . colon cancer resection  1990s   By Dr. Pat Patrick  . HERNIA REPAIR  1995   ventral  . JOINT REPLACEMENT    . OOPHORECTOMY    . SPINE SURGERY      Prior to Admission medications   Medication Sig Start Date End Date Taking? Authorizing Provider  atenolol (TENORMIN) 50 MG tablet Take by mouth. 07/30/16  Yes Historical Provider, MD  Cyanocobalamin (RA VITAMIN B-12 TR) 1000 MCG TBCR Take by mouth.   Yes Historical Provider, MD  donepezil (ARICEPT) 5 MG tablet Take by mouth. 06/29/16 12/26/16 Yes Historical Provider, MD  fentaNYL (DURAGESIC - DOSED MCG/HR) 12 MCG/HR  01/15/14  Yes Historical Provider, MD  HYDROcodone-acetaminophen (NORCO) 10-325 MG tablet  05/23/14  Yes Historical Provider, MD  levothyroxine (SYNTHROID, LEVOTHROID) 25 MCG tablet Take by mouth. 04/27/16  Yes Historical Provider, MD  mirtazapine (REMERON) 30 MG tablet Take by mouth. 08/19/15  Yes Historical Provider, MD  pantoprazole (PROTONIX) 20 MG tablet Take by mouth. 07/11/16 07/11/17 Yes Historical Provider, MD  traZODone (DESYREL) 100 MG tablet Take by mouth. 09/20/15  Yes Historical Provider, MD    Family History  Problem Relation Age of Onset  . Diabetes Mother   . Heart attack Mother   .  Cancer Sister   . Diabetes Sister   . Alcohol abuse Brother   . Diabetes Brother   . Heart attack Brother   . Stroke Brother      Social History  Substance Use Topics  . Smoking status: Never Smoker  . Smokeless tobacco: Never Used  . Alcohol use No    Allergies as of 08/07/2016  . (No Known Allergies)    Review of Systems:    All systems reviewed and negative except where noted in HPI.   Physical Exam:  BP (!) 156/71   Pulse 65   Temp 97.6 F (36.4 C) (Oral)   Ht 5\' 3"  (1.6 m)   Wt 158 lb (71.7 kg)   BMI 27.99 kg/m  No LMP recorded. Patient is postmenopausal. Psych:  Alert and cooperative. Normal mood and affect. General:   Alert,   Well-developed, well-nourished, pleasant and cooperative in NAD Head:  Normocephalic and atraumatic. Eyes:  Sclera clear, no icterus.   Conjunctiva pink. Ears:  Normal auditory acuity. Rectal:  Deferred.  Skin:  Intact without significant lesions or rashes.  No jaundice. Psych:  Alert and cooperative. Normal mood and affect.  Imaging Studies: Ct Abdomen Pelvis W Contrast  Result Date: 07/27/2016 CLINICAL DATA:  Colon cancer status post resection. Diffuse abdominal pain for 2 weeks. EXAM: CT ABDOMEN AND PELVIS WITH CONTRAST TECHNIQUE: Multidetector CT imaging of the abdomen and pelvis was performed using the standard protocol following bolus administration of intravenous contrast. CONTRAST:  1mL ISOVUE-300 IOPAMIDOL (ISOVUE-300) INJECTION 61% COMPARISON:  02/21/2012 FINDINGS: Lower chest:  Unremarkable. Hepatobiliary: No focal abnormality within the liver parenchyma. Gallbladder is decompressed. Multiple stones are seen in the gallbladder, best visualized on sagittal reformation is (see images 25-29 of series 6). Mild intrahepatic biliary duct prominence is associated with mild distention of the extrahepatic common duct measuring up to 9 mm diameter. High attenuation dependently in the common bile ductat the pancreatic head is compatible with choledocholithiasis (see image 29 series 2 and image 48 series 6). Pancreas: No focal mass lesion or evidence of differential enhancement. No dilatation of the main duct. Spleen: No splenomegaly. No focal mass lesion. Adrenals/Urinary Tract: Right adrenal gland normal. 3.2 x 3.2 cm left adrenal lesion is stable since the prior study an remains unchanged comparing back to exam from 01/12/2011. This also appears stable back to lumbar spine MRI 06/14/2008. Earlier studies better demonstrated macroscopic fat within this lesion, consistent with myelolipoma. 2.7 cm water density lesion interpolar right kidney is compatible with a cyst. Other smaller low-density right renal  lesions cannot be completely characterized for also likely cysts. Scattered areas of focal cortical scarring noted in the kidneys bilaterally. No evidence for enhancing renal mass. Stomach/Bowel: Small to moderate hiatal hernia. Stomach otherwise unremarkable. Duodenum is normally positioned as is the ligament of Treitz. No small bowel wall thickening. No small bowel dilatation. Small diverticuli are seen along the length of the distal and terminal ileum. The appendix is not visualized, but there is no edema or inflammation in the region of the cecum. No gross colonic mass. No colonic wall thickening. No substantial diverticular change. Sigmoid anastomotic suture line evident. Vascular/Lymphatic: There is abdominal aortic atherosclerosis without aneurysm. There is no gastrohepatic or hepatoduodenal ligament lymphadenopathy. No intraperitoneal or retroperitoneal lymphadenopathy. No pelvic sidewall lymphadenopathy. Reproductive: Uterus surgically absent.  There is no adnexal mass. Other: No intraperitoneal free fluid. Musculoskeletal: Ventral mesh is evident. Bones are demineralized. Lumbar degenerative changes noted. 2.7 cm posterior left paramidline subcutaneous lesion likely  a sebaceous cyst. IMPRESSION: 1. Contracted gallbladder with gallstones. 2. Mild intra and extrahepatic biliary distention with CT evidence suggesting stones in the distal common bile duct. ERCP or MRCP may prove helpful to confirm, as clinically warranted. 3. 3.2 cm left adrenal lesion stable since 2009. Presence of macroscopic fat suggests myelolipoma. 4. Abdominal aortic atherosclerosis. Electronically Signed   By: Misty Stanley M.D.   On: 07/27/2016 10:10    Assessment and Plan:   Kerri Rangel is a 82 y.o. y/o female Who comes in today with a finding of common bile duct stones with a dilated common bile duct. The patient has some nausea and abdominal pain which may be due to the stone.  Due to the patient's advanced age I have  given the patient and her family the risks and benefits associated with the retained stone and the risks associated with an ERCP including pancreatitis and infection with possible pancreatic abscess and death.  The patient appears to be in good health despite her advanced age.  The family would like to set up the ERCP while they think about whether or not they will go through with it.  They will contact me if they decide not to proceed with the ERCP.  45 minutes were spent explaining the patient's options, risks and benefits and answering the patient's family's questions.    Lucilla Lame, MD. Marval Regal   Note: This dictation was prepared with Dragon dictation along with smaller phrase technology. Any transcriptional errors that result from this process are unintentional.

## 2016-08-08 ENCOUNTER — Other Ambulatory Visit: Payer: Self-pay

## 2016-08-28 ENCOUNTER — Ambulatory Visit: Payer: PPO | Admitting: Anesthesiology

## 2016-08-28 ENCOUNTER — Ambulatory Visit
Admission: RE | Admit: 2016-08-28 | Discharge: 2016-08-28 | Disposition: A | Discharge status: Home / Self Care | Payer: PPO | Source: Ambulatory Visit | Attending: Gastroenterology | Admitting: Gastroenterology

## 2016-08-28 ENCOUNTER — Encounter
Admission: RE | Disposition: A | Discharge status: Home / Self Care | Payer: Self-pay | Source: Ambulatory Visit | Attending: Gastroenterology

## 2016-08-28 ENCOUNTER — Encounter: Payer: Self-pay | Admitting: *Deleted

## 2016-08-28 DIAGNOSIS — Z833 Family history of diabetes mellitus: Secondary | ICD-10-CM | POA: Insufficient documentation

## 2016-08-28 DIAGNOSIS — Z90710 Acquired absence of both cervix and uterus: Secondary | ICD-10-CM | POA: Insufficient documentation

## 2016-08-28 DIAGNOSIS — F0280 Dementia in other diseases classified elsewhere without behavioral disturbance: Secondary | ICD-10-CM | POA: Insufficient documentation

## 2016-08-28 DIAGNOSIS — Z811 Family history of alcohol abuse and dependence: Secondary | ICD-10-CM | POA: Insufficient documentation

## 2016-08-28 DIAGNOSIS — M810 Age-related osteoporosis without current pathological fracture: Secondary | ICD-10-CM | POA: Diagnosis not present

## 2016-08-28 DIAGNOSIS — Z9841 Cataract extraction status, right eye: Secondary | ICD-10-CM | POA: Insufficient documentation

## 2016-08-28 DIAGNOSIS — Z9660 Presence of unspecified orthopedic joint implant: Secondary | ICD-10-CM | POA: Diagnosis not present

## 2016-08-28 DIAGNOSIS — E039 Hypothyroidism, unspecified: Secondary | ICD-10-CM | POA: Insufficient documentation

## 2016-08-28 DIAGNOSIS — Z85038 Personal history of other malignant neoplasm of large intestine: Secondary | ICD-10-CM | POA: Insufficient documentation

## 2016-08-28 DIAGNOSIS — Z8249 Family history of ischemic heart disease and other diseases of the circulatory system: Secondary | ICD-10-CM | POA: Diagnosis not present

## 2016-08-28 DIAGNOSIS — I10 Essential (primary) hypertension: Secondary | ICD-10-CM | POA: Insufficient documentation

## 2016-08-28 DIAGNOSIS — K8051 Calculus of bile duct without cholangitis or cholecystitis with obstruction: Secondary | ICD-10-CM | POA: Diagnosis not present

## 2016-08-28 DIAGNOSIS — Z79899 Other long term (current) drug therapy: Secondary | ICD-10-CM | POA: Insufficient documentation

## 2016-08-28 DIAGNOSIS — K311 Adult hypertrophic pyloric stenosis: Secondary | ICD-10-CM | POA: Insufficient documentation

## 2016-08-28 DIAGNOSIS — Z809 Family history of malignant neoplasm, unspecified: Secondary | ICD-10-CM | POA: Diagnosis not present

## 2016-08-28 DIAGNOSIS — E559 Vitamin D deficiency, unspecified: Secondary | ICD-10-CM | POA: Diagnosis not present

## 2016-08-28 DIAGNOSIS — F329 Major depressive disorder, single episode, unspecified: Secondary | ICD-10-CM | POA: Insufficient documentation

## 2016-08-28 DIAGNOSIS — F419 Anxiety disorder, unspecified: Secondary | ICD-10-CM | POA: Insufficient documentation

## 2016-08-28 DIAGNOSIS — Z9842 Cataract extraction status, left eye: Secondary | ICD-10-CM | POA: Diagnosis not present

## 2016-08-28 DIAGNOSIS — K219 Gastro-esophageal reflux disease without esophagitis: Secondary | ICD-10-CM | POA: Diagnosis not present

## 2016-08-28 DIAGNOSIS — Z823 Family history of stroke: Secondary | ICD-10-CM | POA: Insufficient documentation

## 2016-08-28 DIAGNOSIS — I4891 Unspecified atrial fibrillation: Secondary | ICD-10-CM | POA: Diagnosis not present

## 2016-08-28 DIAGNOSIS — E785 Hyperlipidemia, unspecified: Secondary | ICD-10-CM | POA: Insufficient documentation

## 2016-08-28 DIAGNOSIS — K2970 Gastritis, unspecified, without bleeding: Secondary | ICD-10-CM | POA: Diagnosis not present

## 2016-08-28 DIAGNOSIS — G301 Alzheimer's disease with late onset: Secondary | ICD-10-CM | POA: Diagnosis not present

## 2016-08-28 HISTORY — PX: ERCP: SHX5425

## 2016-08-28 SURGERY — ERCP, WITH INTERVENTION IF INDICATED
Anesthesia: General

## 2016-08-28 MED ORDER — PROPOFOL 500 MG/50ML IV EMUL
INTRAVENOUS | Status: AC
  Filled 2016-08-28: qty 50

## 2016-08-28 MED ORDER — SODIUM CHLORIDE 0.9 % IV SOLN
INTRAVENOUS | Status: DC
  Administered 2016-08-28: 11:00:00 via INTRAVENOUS

## 2016-08-28 MED ORDER — FENTANYL CITRATE (PF) 100 MCG/2ML IJ SOLN
INTRAMUSCULAR | Status: DC | PRN
  Administered 2016-08-28 (×2): 25 ug via INTRAVENOUS

## 2016-08-28 MED ORDER — LIDOCAINE 2% (20 MG/ML) 5 ML SYRINGE
INTRAMUSCULAR | Status: AC
  Filled 2016-08-28: qty 5

## 2016-08-28 MED ORDER — LIDOCAINE HCL (PF) 2 % IJ SOLN
INTRAMUSCULAR | Status: DC | PRN
  Administered 2016-08-28: 60 mg via INTRADERMAL

## 2016-08-28 MED ORDER — GLYCOPYRROLATE 0.2 MG/ML IJ SOLN
INTRAMUSCULAR | Status: AC
  Filled 2016-08-28: qty 1

## 2016-08-28 MED ORDER — PROPOFOL 500 MG/50ML IV EMUL
INTRAVENOUS | Status: DC | PRN
  Administered 2016-08-28: 100 ug/kg/min via INTRAVENOUS

## 2016-08-28 MED ORDER — FENTANYL CITRATE (PF) 100 MCG/2ML IJ SOLN
INTRAMUSCULAR | Status: AC
  Filled 2016-08-28: qty 2

## 2016-08-28 MED ORDER — PROPOFOL 10 MG/ML IV BOLUS
INTRAVENOUS | Status: DC | PRN
  Administered 2016-08-28: 20 mg via INTRAVENOUS
  Administered 2016-08-28: 10 mg via INTRAVENOUS
  Administered 2016-08-28: 30 mg via INTRAVENOUS

## 2016-08-28 NOTE — Brief Op Note (Signed)
MD unable to get past pyloris due to obstruction.

## 2016-08-28 NOTE — Op Note (Signed)
[facility] Gastroenterology Patient Name: Kerri Rangel Procedure Date: 08/28/2016 11:17 AM MRN: [id number] Account #: [id number] Date of Birth: 1923-11-05 Admit Type: Outpatient Age: 83 Room: [facility] ENDO ROOM 4 Gender: Female Note Status: Finalized Procedure:            ERCP Indications:          Biliary dilation on Computed Tomogram Scan, Suspected                        bile duct stone(s) Providers:            Lucilla Lame MD, MD Referring MD:         Adrian Prows (Referring MD) Medicines:            Propofol per Anesthesia Complications:        No immediate complications. Procedure:            Pre-Anesthesia Assessment:                       - Prior to the procedure, a History and Physical was                        performed, and patient medications and allergies were                        reviewed. The patient's tolerance of previous                        anesthesia was also reviewed. The risks and benefits of                        the procedure and the sedation options and risks were                        discussed with the patient. All questions were                        answered, and informed consent was obtained. Prior                        Anticoagulants: The patient has taken no previous                        anticoagulant or antiplatelet agents. ASA Grade                        Assessment: III - A patient with severe systemic                        disease. After reviewing the risks and benefits, the                        patient was deemed in satisfactory condition to undergo                        the procedure.                       After obtaining informed consent, the scope was passed  under direct vision. Throughout the procedure, the                        patient's blood pressure, pulse, and oxygen saturations                        were monitored continuously. The Endoscope was   introduced through the mouth, and used to inject                        contrast into without successful cannulation. The ERCP                        was accomplished without difficulty. The patient                        tolerated the procedure well. Findings:      The scout film was normal. The upper GI tract was traversed under direct       vision without detailed examination. A benign-appearing, intrinsic       severe stenosis was found at the pylorus. This was non-traversed. A TTS       dilator was passed through the scope. Dilation with a 12-13.5-15 mm       pyloric balloon dilator was performed at the pylorus. Impression:           - Gastric stenosis was found at the pylorus.                       - Dilation performed at the pylorus.                       - Despite repeated dilation, the scope would not pass                        through the pylorus. Recommendation:       - Repeat ERCP for retreatment. Procedure Code(s):    --- Professional ---                       [phone], Esophagogastroduodenoscopy, flexible, transoral;                        with dilation of gastric/duodenal stricture(s) (eg,                        balloon, bougie) Diagnosis Code(s):    --- Professional ---                       K83.8, Other specified diseases of biliary tract                       K31.1, Adult hypertrophic pyloric stenosis CPT copyright 2016 American Medical Association. All rights reserved. The codes documented in this report are preliminary and upon coder review may  be revised to meet current compliance requirements. Lucilla Lame MD, MD 08/28/2016 12:25:54 PM This report has been signed electronically. Number of Addenda: 0 Note Initiated On: 08/28/2016 11:17 AM      [facility]

## 2016-08-28 NOTE — Brief Op Note (Signed)
Pyloris dilated a second time through ERCP scope.

## 2016-08-28 NOTE — H&P (Signed)
Lucilla Lame, MD [facility] [address] [place] [address], [place] [address] Phone: [phone] Fax : [phone]  Primary Care Physician:  Leonel Ramsay, MD Primary Gastroenterologist:  Dr. Allen Norris  Pre-Procedure History & Physical: HPI:  Kerri Rangel is a 83 y.o. female is here for an ERCP.   Past Medical History:  Diagnosis Date  . Anemia   . Anxiety 08/07/2016  . Cancer ([place])    colon ca  . Common bile duct (CBD) obstruction 08/01/2016   Overview:  Possible stone noted 07/26/16 - refer to GI. LFTs nml but does have chronic abd pain   . Dementia   . Depression 08/07/2016  . Dizzy 03/02/2014  . Edema 03/02/2014  . Gallstone 08/01/2016  . GERD (gastroesophageal reflux disease)   . Heart disease   . Hyperlipidemia, unspecified 08/07/2016  . Hypertension 08/07/2016  . Hypothyroidism 08/07/2016  . Late onset Alzheimer's disease with behavioral disturbance 03/02/2015  . Osteoporosis   . Vitamin D deficiency     Past Surgical History:  Procedure Laterality Date  . ABDOMINAL HYSTERECTOMY    . APPENDECTOMY    . Back sugery  03/2002   lumbar disk and fusion. Dr. Mauri Pole   . BREAST EXCISIONAL BIOPSY     Benign. Dr. Pat Patrick  . CATARACT EXTRACTION Bilateral   . colon cancer resection  1990s   By Dr. Pat Patrick  . HERNIA REPAIR  1995   ventral  . JOINT REPLACEMENT    . OOPHORECTOMY    . SPINE SURGERY      Prior to Admission medications   Medication Sig Start Date End Date Taking? Authorizing Provider  atenolol (TENORMIN) 50 MG tablet Take by mouth. 07/30/16  Yes Historical Provider, MD  Cyanocobalamin (RA VITAMIN B-12 TR) 1000 MCG TBCR Take by mouth.   Yes Historical Provider, MD  donepezil (ARICEPT) 5 MG tablet Take by mouth. 06/29/16 12/26/16 Yes Historical Provider, MD  fentaNYL (DURAGESIC - DOSED MCG/HR) 12 MCG/HR  01/15/14  Yes Historical Provider, MD  HYDROcodone-acetaminophen (NORCO) 10-325 MG tablet  05/23/14  Yes Historical Provider, MD  levothyroxine (SYNTHROID, LEVOTHROID) 25  MCG tablet Take by mouth. 04/27/16  Yes Historical Provider, MD  mirtazapine (REMERON) 30 MG tablet Take by mouth. 08/19/15  Yes Historical Provider, MD  pantoprazole (PROTONIX) 20 MG tablet Take by mouth. 07/11/16 07/11/17 Yes Historical Provider, MD  traZODone (DESYREL) 100 MG tablet Take by mouth. 09/20/15  Yes Historical Provider, MD    Allergies as of 08/08/2016  . (No Known Allergies)    Family History  Problem Relation Age of Onset  . Diabetes Mother   . Heart attack Mother   . Cancer Sister   . Diabetes Sister   . Alcohol abuse Brother   . Diabetes Brother   . Heart attack Brother   . Stroke Brother     Social History   Social History  . Marital status: Widowed    Spouse name: N/A  . Number of children: N/A  . Years of education: N/A   Occupational History  . Not on file.   Social History Main Topics  . Smoking status: Never Smoker  . Smokeless tobacco: Never Used  . Alcohol use No  . Drug use: No  . Sexual activity: Not on file   Other Topics Concern  . Not on file   Social History Narrative  . No narrative on file    Review of Systems: See HPI, otherwise negative ROS  Physical Exam: BP (!) 159/69  Pulse 74   Temp 98.1 F (36.7 C) (Tympanic)   Resp 16   Ht 5\' 3"  (1.6 m)   Wt 158 lb (71.7 kg)   SpO2 99%   BMI 27.99 kg/m  General:   Alert,  pleasant and cooperative in NAD Head:  Normocephalic and atraumatic. Neck:  Supple; no masses or thyromegaly. Lungs:  Clear throughout to auscultation.    Heart:  Regular rate and rhythm. Abdomen:  Soft, nontender and nondistended. Normal bowel sounds, without guarding, and without rebound.   Neurologic:  Alert and  oriented x4;  grossly normal neurologically.  Impression/Plan: Kerri Rangel is here for an ERCP to be performed for cbd stone  Risks, benefits, limitations, and alternatives regarding  ERCP have been reviewed with the patient.  Questions have been answered.  All parties  agreeable.   Lucilla Lame, MD  08/28/2016, 12:31 PM

## 2016-08-28 NOTE — Transfer of Care (Signed)
Immediate Anesthesia Transfer of Care Note  Patient: Kerri Rangel  Procedure(s) Performed: Procedure(s): ENDOSCOPIC RETROGRADE CHOLANGIOPANCREATOGRAPHY (ERCP) (N/A)  Patient Location: PACU  Anesthesia Type:General  Level of Consciousness: awake  Airway & Oxygen Therapy: Patient Spontanous Breathing and Patient connected to nasal cannula oxygen  Post-op Assessment: Report given to RN and Post -op Vital signs reviewed and stable  Post vital signs: Reviewed and stable  Last Vitals:  Vitals:   08/28/16 1051 08/28/16 1237  BP: (!) 159/69 (!) 169/89  Pulse: 74 67  Resp: 16 20  Temp: 36.7 C (!) 35.8 C    Last Pain:  Vitals:   08/28/16 1237  TempSrc: Tympanic         Complications: No apparent anesthesia complications

## 2016-08-28 NOTE — Anesthesia Preprocedure Evaluation (Signed)
Anesthesia Evaluation  Patient identified by MRN, date of birth, ID band Patient confused    Reviewed: Allergy & Precautions, H&P , NPO status , Patient's Chart, lab work & pertinent test results  History of Anesthesia Complications (+) PONV, PROLONGED EMERGENCE and history of anesthetic complications  Airway Mallampati: III  TM Distance: <3 FB Neck ROM: limited    Dental  (+) Poor Dentition, Missing, Upper Dentures, Lower Dentures   Pulmonary neg pulmonary ROS, neg shortness of breath, neg sleep apnea,    Pulmonary exam normal breath sounds clear to auscultation       Cardiovascular Exercise Tolerance: Good hypertension, (-) angina(-) Past MI and (-) DOE Normal cardiovascular exam+ dysrhythmias Atrial Fibrillation  Rhythm:regular Rate:Normal     Neuro/Psych PSYCHIATRIC DISORDERS Anxiety negative neurological ROS  negative psych ROS   GI/Hepatic Neg liver ROS, GERD  Controlled and Medicated,  Endo/Other  Hypothyroidism   Renal/GU negative Renal ROS  negative genitourinary   Musculoskeletal   Abdominal   Peds  Hematology negative hematology ROS (+)   Anesthesia Other Findings Patient is NPO appropriate and reports no nausea or vomiting today.   Past Medical History: No date: Anemia 08/07/2016: Anxiety No date: Cancer [facility])     Comment: colon ca 08/01/2016: Common bile duct (CBD) obstruction     Comment: Overview:  Possible stone noted 07/26/16 -               refer to GI. LFTs nml but does have chronic abd              pain  No date: Dementia 08/07/2016: Depression 03/02/2014: Dizzy 03/02/2014: Edema 08/01/2016: Gallstone No date: GERD (gastroesophageal reflux disease) No date: Heart disease 08/07/2016: Hyperlipidemia, unspecified 08/07/2016: Hypertension 08/07/2016: Hypothyroidism 03/02/2015: Late onset Alzheimer's disease with behavioral* No date: Osteoporosis No date: Vitamin D deficiency  Past Surgical  History: No date: ABDOMINAL HYSTERECTOMY No date: APPENDECTOMY 03/2002: Back sugery     Comment: lumbar disk and fusion. Dr. Mauri Pole  No date: BREAST EXCISIONAL BIOPSY     Comment: Benign. Dr. Pat Patrick No date: CATARACT EXTRACTION Bilateral 1990s: colon cancer resection     Comment: By Dr. Pat Patrick 1995: HERNIA REPAIR     Comment: ventral No date: JOINT REPLACEMENT No date: OOPHORECTOMY No date: SPINE SURGERY     Reproductive/Obstetrics negative OB ROS                             Anesthesia Physical Anesthesia Plan  ASA: III  Anesthesia Plan: General   Post-op Pain Management:    Induction:   Airway Management Planned:   Additional Equipment:   Intra-op Plan:   Post-operative Plan:   Informed Consent: I have reviewed the patients History and Physical, chart, labs and discussed the procedure including the risks, benefits and alternatives for the proposed anesthesia with the patient or authorized representative who has indicated his/her understanding and acceptance.   Dental Advisory Given  Plan Discussed with: Anesthesiologist, CRNA and Surgeon  Anesthesia Plan Comments: (Daughter consented  Patient and family informed that patient is higher risk for complications from anesthesia during this procedure due to their medical history and age including but not limited to post operative cognitive dysfunction.  They voiced understanding. )        Anesthesia Quick Evaluation

## 2016-08-28 NOTE — Brief Op Note (Signed)
ERCP scope unable to cannulate pyloris and scope  withdrawn, EGD scope used to dilate pyloris, then ERCP scope reintroduced for procedure.

## 2016-08-28 NOTE — Anesthesia Postprocedure Evaluation (Signed)
Anesthesia Post Note  Patient: Kerri Rangel  Procedure(s) Performed: Procedure(s) (LRB): ENDOSCOPIC RETROGRADE CHOLANGIOPANCREATOGRAPHY (ERCP) (N/A)  Patient location during evaluation: Endoscopy Anesthesia Type: General Level of consciousness: awake and alert Pain management: pain level controlled Vital Signs Assessment: post-procedure vital signs reviewed and stable Respiratory status: spontaneous breathing, nonlabored ventilation, respiratory function stable and patient connected to nasal cannula oxygen Cardiovascular status: blood pressure returned to baseline and stable Postop Assessment: no signs of nausea or vomiting Anesthetic complications: no     Last Vitals:  Vitals:   08/28/16 1305 08/28/16 1315  BP: (!) 170/64 (!) 160/69  Pulse: 65 62  Resp: 13 14  Temp:      Last Pain:  Vitals:   08/28/16 1237  TempSrc: Tympanic                 [name]

## 2016-08-29 ENCOUNTER — Encounter: Payer: Self-pay | Admitting: Gastroenterology

## 2016-08-29 DIAGNOSIS — G301 Alzheimer's disease with late onset: Secondary | ICD-10-CM | POA: Diagnosis not present

## 2016-08-29 DIAGNOSIS — G4710 Hypersomnia, unspecified: Secondary | ICD-10-CM | POA: Diagnosis not present

## 2016-08-29 DIAGNOSIS — F0281 Dementia in other diseases classified elsewhere with behavioral disturbance: Secondary | ICD-10-CM | POA: Diagnosis not present

## 2016-08-29 DIAGNOSIS — F419 Anxiety disorder, unspecified: Secondary | ICD-10-CM | POA: Diagnosis not present

## 2016-08-29 DIAGNOSIS — Z8659 Personal history of other mental and behavioral disorders: Secondary | ICD-10-CM | POA: Diagnosis not present

## 2016-09-24 DIAGNOSIS — M5417 Radiculopathy, lumbosacral region: Secondary | ICD-10-CM | POA: Diagnosis not present

## 2016-09-24 DIAGNOSIS — R5383 Other fatigue: Secondary | ICD-10-CM | POA: Diagnosis not present

## 2016-09-24 DIAGNOSIS — J449 Chronic obstructive pulmonary disease, unspecified: Secondary | ICD-10-CM | POA: Diagnosis not present

## 2016-09-24 DIAGNOSIS — Z79891 Long term (current) use of opiate analgesic: Secondary | ICD-10-CM | POA: Diagnosis not present

## 2016-09-24 DIAGNOSIS — F4310 Post-traumatic stress disorder, unspecified: Secondary | ICD-10-CM | POA: Diagnosis not present

## 2016-09-24 DIAGNOSIS — F1120 Opioid dependence, uncomplicated: Secondary | ICD-10-CM | POA: Diagnosis not present

## 2016-09-24 DIAGNOSIS — M5414 Radiculopathy, thoracic region: Secondary | ICD-10-CM | POA: Diagnosis not present

## 2016-09-24 DIAGNOSIS — M545 Low back pain: Secondary | ICD-10-CM | POA: Diagnosis not present

## 2016-09-24 DIAGNOSIS — F1920 Other psychoactive substance dependence, uncomplicated: Secondary | ICD-10-CM | POA: Diagnosis not present

## 2016-09-24 DIAGNOSIS — I10 Essential (primary) hypertension: Secondary | ICD-10-CM | POA: Diagnosis not present

## 2016-09-24 DIAGNOSIS — F329 Major depressive disorder, single episode, unspecified: Secondary | ICD-10-CM | POA: Diagnosis not present

## 2016-10-02 DIAGNOSIS — F332 Major depressive disorder, recurrent severe without psychotic features: Secondary | ICD-10-CM | POA: Diagnosis not present

## 2016-10-02 DIAGNOSIS — G301 Alzheimer's disease with late onset: Secondary | ICD-10-CM | POA: Diagnosis not present

## 2016-10-02 DIAGNOSIS — I159 Secondary hypertension, unspecified: Secondary | ICD-10-CM | POA: Diagnosis not present

## 2016-10-02 DIAGNOSIS — E039 Hypothyroidism, unspecified: Secondary | ICD-10-CM | POA: Diagnosis not present

## 2016-10-02 DIAGNOSIS — F419 Anxiety disorder, unspecified: Secondary | ICD-10-CM | POA: Diagnosis not present

## 2016-10-02 DIAGNOSIS — F0281 Dementia in other diseases classified elsewhere with behavioral disturbance: Secondary | ICD-10-CM | POA: Diagnosis not present

## 2016-10-02 DIAGNOSIS — N3020 Other chronic cystitis without hematuria: Secondary | ICD-10-CM | POA: Diagnosis not present

## 2016-10-15 DIAGNOSIS — F332 Major depressive disorder, recurrent severe without psychotic features: Secondary | ICD-10-CM | POA: Diagnosis not present

## 2016-10-15 DIAGNOSIS — K831 Obstruction of bile duct: Secondary | ICD-10-CM | POA: Diagnosis not present

## 2016-10-15 DIAGNOSIS — F0281 Dementia in other diseases classified elsewhere with behavioral disturbance: Secondary | ICD-10-CM | POA: Diagnosis not present

## 2016-10-15 DIAGNOSIS — E039 Hypothyroidism, unspecified: Secondary | ICD-10-CM | POA: Diagnosis not present

## 2016-10-15 DIAGNOSIS — I10 Essential (primary) hypertension: Secondary | ICD-10-CM | POA: Diagnosis not present

## 2016-10-15 DIAGNOSIS — G301 Alzheimer's disease with late onset: Secondary | ICD-10-CM | POA: Diagnosis not present

## 2016-10-30 DIAGNOSIS — M79674 Pain in right toe(s): Secondary | ICD-10-CM | POA: Diagnosis not present

## 2016-10-30 DIAGNOSIS — M79675 Pain in left toe(s): Secondary | ICD-10-CM | POA: Diagnosis not present

## 2016-10-30 DIAGNOSIS — B351 Tinea unguium: Secondary | ICD-10-CM | POA: Diagnosis not present

## 2016-11-05 ENCOUNTER — Other Ambulatory Visit (INDEPENDENT_AMBULATORY_CARE_PROVIDER_SITE_OTHER): Payer: Self-pay | Admitting: Vascular Surgery

## 2016-11-05 DIAGNOSIS — I6523 Occlusion and stenosis of bilateral carotid arteries: Secondary | ICD-10-CM

## 2016-11-06 ENCOUNTER — Ambulatory Visit (INDEPENDENT_AMBULATORY_CARE_PROVIDER_SITE_OTHER): Payer: PPO | Admitting: Vascular Surgery

## 2016-11-06 ENCOUNTER — Ambulatory Visit (INDEPENDENT_AMBULATORY_CARE_PROVIDER_SITE_OTHER): Payer: PPO

## 2016-11-06 VITALS — BP 177/76 | HR 58 | Resp 15 | Ht 63.5 in | Wt 155.0 lb

## 2016-11-06 DIAGNOSIS — I6523 Occlusion and stenosis of bilateral carotid arteries: Secondary | ICD-10-CM | POA: Diagnosis not present

## 2016-11-06 DIAGNOSIS — I159 Secondary hypertension, unspecified: Secondary | ICD-10-CM | POA: Diagnosis not present

## 2016-11-06 DIAGNOSIS — E785 Hyperlipidemia, unspecified: Secondary | ICD-10-CM

## 2016-11-06 DIAGNOSIS — I739 Peripheral vascular disease, unspecified: Secondary | ICD-10-CM

## 2016-11-06 DIAGNOSIS — I779 Disorder of arteries and arterioles, unspecified: Secondary | ICD-10-CM | POA: Diagnosis not present

## 2016-11-06 NOTE — Progress Notes (Signed)
Subjective:    Patient ID: Kerri Rangel, female    DOB: [birth year], 83 y.o.   MRN: [id number] Chief Complaint  Patient presents with  . Carotid    1 year carotid follow up   Patient presents for a non-invasive yearly study follow up for carotid stenosis. The stenosis has been followed by surveillance duplexes. The patient underwent a bilateral carotid duplex scan which showed no change from the previous exam on 11/04/15. Duplex is stable at 1-39% ICA stenosis bilaterally. The patient denies experiencing Amaurosis Fugax, TIA like symptoms or focal motor deficits.    Review of Systems  Constitutional: Negative.   HENT: Negative.   Eyes: Negative.   Respiratory: Negative.   Cardiovascular: Negative.   Gastrointestinal: Negative.   Endocrine: Negative.   Genitourinary: Negative.   Musculoskeletal: Negative.   Skin: Negative.   Allergic/Immunologic: Negative.   Neurological: Negative.   Hematological: Negative.   Psychiatric/Behavioral: Negative.       Objective:   Physical Exam  Constitutional: She is oriented to person, place, and time. She appears well-developed and well-nourished.  HENT:  Head: Normocephalic and atraumatic.  Right Ear: External ear normal.  Left Ear: External ear normal.  Eyes: Conjunctivae and EOM are normal.  Neck: Normal range of motion.  No carotid bruit auscultated.   Cardiovascular: Normal rate, regular rhythm and normal heart sounds.   Pulses:      Radial pulses are 2+ on the right side, and 2+ on the left side.  Pulmonary/Chest: Effort normal and breath sounds normal.  Musculoskeletal: Normal range of motion. She exhibits no edema.  Neurological: She is alert and oriented to person, place, and time.  Skin: Skin is warm and dry.  Psychiatric: She has a normal mood and affect. Her behavior is normal. Judgment and thought content normal.   BP (!) 177/76 (BP Location: Left Arm)   Pulse (!) 58   Resp 15   Ht 5' 3.5" (1.613 m)   Wt 155 lb  (70.3 kg)   BMI 27.03 kg/m   Past Medical History:  Diagnosis Date  . Anemia   . Anxiety 08/07/2016  . Cancer ([place])    colon ca  . Common bile duct (CBD) obstruction 08/01/2016   Overview:  Possible stone noted 07/26/16 - refer to GI. LFTs nml but does have chronic abd pain   . Dementia   . Depression 08/07/2016  . Dizzy 03/02/2014  . Edema 03/02/2014  . Gallstone 08/01/2016  . GERD (gastroesophageal reflux disease)   . Heart disease   . Hyperlipidemia, unspecified 08/07/2016  . Hypertension 08/07/2016  . Hypothyroidism 08/07/2016  . Late onset Alzheimer's disease with behavioral disturbance 03/02/2015  . Osteoporosis   . Vitamin D deficiency    Social History   Social History  . Marital status: Widowed    Spouse name: N/A  . Number of children: N/A  . Years of education: N/A   Occupational History  . Not on file.   Social History Main Topics  . Smoking status: Never Smoker  . Smokeless tobacco: Never Used  . Alcohol use No  . Drug use: No  . Sexual activity: Not on file   Other Topics Concern  . Not on file   Social History Narrative  . No narrative on file   Past Surgical History:  Procedure Laterality Date  . ABDOMINAL HYSTERECTOMY    . APPENDECTOMY    . Back sugery  03/2002   lumbar disk and fusion. Dr. Mauri Pole   .  BREAST EXCISIONAL BIOPSY     Benign. Dr. Pat Patrick  . CATARACT EXTRACTION Bilateral   . colon cancer resection  1990s   By Dr. Pat Patrick  . ERCP N/A 08/28/2016   Procedure: ENDOSCOPIC RETROGRADE CHOLANGIOPANCREATOGRAPHY (ERCP);  Surgeon: Lucilla Lame, MD;  Location: [facility] ENDOSCOPY;  Service: Endoscopy;  Laterality: N/A;  . HERNIA REPAIR  1995   ventral  . JOINT REPLACEMENT    . OOPHORECTOMY    . SPINE SURGERY     Family History  Problem Relation Age of Onset  . Diabetes Mother   . Heart attack Mother   . Cancer Sister   . Diabetes Sister   . Alcohol abuse Brother   . Diabetes Brother   . Heart attack Brother   . Stroke Brother    No Known  Allergies     Assessment & Plan:  Patient presents for a non-invasive yearly study follow up for carotid stenosis. The stenosis has been followed by surveillance duplexes. The patient underwent a bilateral carotid duplex scan which showed no change from the previous exam on 11/04/15. Duplex is stable at 1-39% ICA stenosis bilaterally. The patient denies experiencing Amaurosis Fugax, TIA like symptoms or focal motor deficits.   1. Bilateral carotid artery disease ([place]) - Stable Studies reviewed with patient. Patient asymptomatic with stable duplex. No intervention at this time. Patient to return in one year for surveillance carotid duplex. I have discussed with the patient at length the risk factors for and pathogenesis of atherosclerotic disease and encouraged a healthy diet, regular exercise regimen and blood pressure / glucose control.  Patient was instructed to contact our office in the interim with problems such as arm / leg weakness or numbness, speech / swallowing difficulty or temporary monocular blindness. The patient expresses their understanding.  - VAS US CAROTID; Future  2. Hyperlipidemia, unspecified hyperlipidemia type - Stable Encouraged good control as its slows the progression of atherosclerotic disease  3. Secondary hypertension - Stable Encouraged good control as its slows the progression of atherosclerotic disease  Current Outpatient Prescriptions on File Prior to Visit  Medication Sig Dispense Refill  . atenolol (TENORMIN) 50 MG tablet Take by mouth.    . Cyanocobalamin (RA VITAMIN B-12 TR) 1000 MCG TBCR Take by mouth.    . donepezil (ARICEPT) 5 MG tablet Take by mouth.    . fentaNYL (DURAGESIC - DOSED MCG/HR) 12 MCG/HR     . HYDROcodone-acetaminophen (NORCO) 10-325 MG tablet     . levothyroxine (SYNTHROID, LEVOTHROID) 25 MCG tablet Take by mouth.    . mirtazapine (REMERON) 30 MG tablet Take by mouth.    . pantoprazole (PROTONIX) 20 MG tablet Take by mouth.    .  traZODone (DESYREL) 100 MG tablet Take by mouth.     No current facility-administered medications on file prior to visit.    There are no Patient Instructions on file for this visit. Return in about 1 year (around 11/06/2017) for Carotid Follow Up.  Dawnn Nam A Garner Dullea, PA-C

## 2016-11-19 DIAGNOSIS — M5417 Radiculopathy, lumbosacral region: Secondary | ICD-10-CM | POA: Diagnosis not present

## 2016-11-19 DIAGNOSIS — F329 Major depressive disorder, single episode, unspecified: Secondary | ICD-10-CM | POA: Diagnosis not present

## 2016-11-19 DIAGNOSIS — F1920 Other psychoactive substance dependence, uncomplicated: Secondary | ICD-10-CM | POA: Diagnosis not present

## 2016-11-19 DIAGNOSIS — R5383 Other fatigue: Secondary | ICD-10-CM | POA: Diagnosis not present

## 2016-11-19 DIAGNOSIS — Z79899 Other long term (current) drug therapy: Secondary | ICD-10-CM | POA: Diagnosis not present

## 2016-11-19 DIAGNOSIS — F4310 Post-traumatic stress disorder, unspecified: Secondary | ICD-10-CM | POA: Diagnosis not present

## 2016-11-19 DIAGNOSIS — M545 Low back pain: Secondary | ICD-10-CM | POA: Diagnosis not present

## 2016-11-19 DIAGNOSIS — F1120 Opioid dependence, uncomplicated: Secondary | ICD-10-CM | POA: Diagnosis not present

## 2016-11-19 DIAGNOSIS — J449 Chronic obstructive pulmonary disease, unspecified: Secondary | ICD-10-CM | POA: Diagnosis not present

## 2016-11-19 DIAGNOSIS — I10 Essential (primary) hypertension: Secondary | ICD-10-CM | POA: Diagnosis not present

## 2016-11-28 DIAGNOSIS — F0281 Dementia in other diseases classified elsewhere with behavioral disturbance: Secondary | ICD-10-CM | POA: Diagnosis not present

## 2016-11-28 DIAGNOSIS — Z8659 Personal history of other mental and behavioral disorders: Secondary | ICD-10-CM | POA: Diagnosis not present

## 2016-11-28 DIAGNOSIS — G4710 Hypersomnia, unspecified: Secondary | ICD-10-CM | POA: Diagnosis not present

## 2016-11-28 DIAGNOSIS — H9193 Unspecified hearing loss, bilateral: Secondary | ICD-10-CM | POA: Diagnosis not present

## 2016-11-28 DIAGNOSIS — G301 Alzheimer's disease with late onset: Secondary | ICD-10-CM | POA: Diagnosis not present

## 2016-11-28 DIAGNOSIS — F419 Anxiety disorder, unspecified: Secondary | ICD-10-CM | POA: Diagnosis not present

## 2016-12-18 DIAGNOSIS — H6123 Impacted cerumen, bilateral: Secondary | ICD-10-CM | POA: Diagnosis not present

## 2016-12-18 DIAGNOSIS — H90A31 Mixed conductive and sensorineural hearing loss, unilateral, right ear with restricted hearing on the contralateral side: Secondary | ICD-10-CM | POA: Diagnosis not present

## 2016-12-18 DIAGNOSIS — H6981 Other specified disorders of Eustachian tube, right ear: Secondary | ICD-10-CM | POA: Diagnosis not present

## 2016-12-18 DIAGNOSIS — H903 Sensorineural hearing loss, bilateral: Secondary | ICD-10-CM | POA: Diagnosis not present

## 2017-01-08 DIAGNOSIS — H903 Sensorineural hearing loss, bilateral: Secondary | ICD-10-CM | POA: Diagnosis not present

## 2017-01-08 DIAGNOSIS — H6980 Other specified disorders of Eustachian tube, unspecified ear: Secondary | ICD-10-CM | POA: Diagnosis not present

## 2017-01-30 DIAGNOSIS — M79674 Pain in right toe(s): Secondary | ICD-10-CM | POA: Diagnosis not present

## 2017-01-30 DIAGNOSIS — B351 Tinea unguium: Secondary | ICD-10-CM | POA: Diagnosis not present

## 2017-01-30 DIAGNOSIS — M79675 Pain in left toe(s): Secondary | ICD-10-CM | POA: Diagnosis not present

## 2017-02-05 DIAGNOSIS — I10 Essential (primary) hypertension: Secondary | ICD-10-CM | POA: Diagnosis not present

## 2017-02-05 DIAGNOSIS — F332 Major depressive disorder, recurrent severe without psychotic features: Secondary | ICD-10-CM | POA: Diagnosis not present

## 2017-02-05 DIAGNOSIS — E039 Hypothyroidism, unspecified: Secondary | ICD-10-CM | POA: Diagnosis not present

## 2017-02-05 DIAGNOSIS — K831 Obstruction of bile duct: Secondary | ICD-10-CM | POA: Diagnosis not present

## 2017-02-11 DIAGNOSIS — Z79899 Other long term (current) drug therapy: Secondary | ICD-10-CM | POA: Diagnosis not present

## 2017-02-11 DIAGNOSIS — F1120 Opioid dependence, uncomplicated: Secondary | ICD-10-CM | POA: Diagnosis not present

## 2017-02-11 DIAGNOSIS — M5417 Radiculopathy, lumbosacral region: Secondary | ICD-10-CM | POA: Diagnosis not present

## 2017-02-11 DIAGNOSIS — F329 Major depressive disorder, single episode, unspecified: Secondary | ICD-10-CM | POA: Diagnosis not present

## 2017-02-11 DIAGNOSIS — F4310 Post-traumatic stress disorder, unspecified: Secondary | ICD-10-CM | POA: Diagnosis not present

## 2017-02-11 DIAGNOSIS — F1920 Other psychoactive substance dependence, uncomplicated: Secondary | ICD-10-CM | POA: Diagnosis not present

## 2017-02-11 DIAGNOSIS — J449 Chronic obstructive pulmonary disease, unspecified: Secondary | ICD-10-CM | POA: Diagnosis not present

## 2017-02-11 DIAGNOSIS — R5383 Other fatigue: Secondary | ICD-10-CM | POA: Diagnosis not present

## 2017-02-11 DIAGNOSIS — M545 Low back pain: Secondary | ICD-10-CM | POA: Diagnosis not present

## 2017-02-11 DIAGNOSIS — I10 Essential (primary) hypertension: Secondary | ICD-10-CM | POA: Diagnosis not present

## 2017-02-12 DIAGNOSIS — E039 Hypothyroidism, unspecified: Secondary | ICD-10-CM | POA: Diagnosis not present

## 2017-02-12 DIAGNOSIS — K831 Obstruction of bile duct: Secondary | ICD-10-CM | POA: Diagnosis not present

## 2017-02-12 DIAGNOSIS — G301 Alzheimer's disease with late onset: Secondary | ICD-10-CM | POA: Diagnosis not present

## 2017-02-12 DIAGNOSIS — F0281 Dementia in other diseases classified elsewhere with behavioral disturbance: Secondary | ICD-10-CM | POA: Diagnosis not present

## 2017-02-12 DIAGNOSIS — F332 Major depressive disorder, recurrent severe without psychotic features: Secondary | ICD-10-CM | POA: Diagnosis not present

## 2017-04-03 DIAGNOSIS — G301 Alzheimer's disease with late onset: Secondary | ICD-10-CM | POA: Diagnosis not present

## 2017-04-03 DIAGNOSIS — Z8659 Personal history of other mental and behavioral disorders: Secondary | ICD-10-CM | POA: Diagnosis not present

## 2017-04-03 DIAGNOSIS — G4710 Hypersomnia, unspecified: Secondary | ICD-10-CM | POA: Diagnosis not present

## 2017-04-03 DIAGNOSIS — H9193 Unspecified hearing loss, bilateral: Secondary | ICD-10-CM | POA: Diagnosis not present

## 2017-04-03 DIAGNOSIS — F0281 Dementia in other diseases classified elsewhere with behavioral disturbance: Secondary | ICD-10-CM | POA: Diagnosis not present

## 2017-04-08 DIAGNOSIS — M545 Low back pain: Secondary | ICD-10-CM | POA: Diagnosis not present

## 2017-04-08 DIAGNOSIS — F1920 Other psychoactive substance dependence, uncomplicated: Secondary | ICD-10-CM | POA: Diagnosis not present

## 2017-04-08 DIAGNOSIS — F4310 Post-traumatic stress disorder, unspecified: Secondary | ICD-10-CM | POA: Diagnosis not present

## 2017-04-08 DIAGNOSIS — Z79899 Other long term (current) drug therapy: Secondary | ICD-10-CM | POA: Diagnosis not present

## 2017-04-08 DIAGNOSIS — I10 Essential (primary) hypertension: Secondary | ICD-10-CM | POA: Diagnosis not present

## 2017-04-08 DIAGNOSIS — M5417 Radiculopathy, lumbosacral region: Secondary | ICD-10-CM | POA: Diagnosis not present

## 2017-04-08 DIAGNOSIS — F329 Major depressive disorder, single episode, unspecified: Secondary | ICD-10-CM | POA: Diagnosis not present

## 2017-04-08 DIAGNOSIS — J449 Chronic obstructive pulmonary disease, unspecified: Secondary | ICD-10-CM | POA: Diagnosis not present

## 2017-04-08 DIAGNOSIS — F1120 Opioid dependence, uncomplicated: Secondary | ICD-10-CM | POA: Diagnosis not present

## 2017-04-08 DIAGNOSIS — R5383 Other fatigue: Secondary | ICD-10-CM | POA: Diagnosis not present

## 2017-05-06 DIAGNOSIS — M79675 Pain in left toe(s): Secondary | ICD-10-CM | POA: Diagnosis not present

## 2017-05-06 DIAGNOSIS — M79674 Pain in right toe(s): Secondary | ICD-10-CM | POA: Diagnosis not present

## 2017-05-06 DIAGNOSIS — B351 Tinea unguium: Secondary | ICD-10-CM | POA: Diagnosis not present

## 2017-05-07 DIAGNOSIS — H6123 Impacted cerumen, bilateral: Secondary | ICD-10-CM | POA: Diagnosis not present

## 2017-05-07 DIAGNOSIS — H6980 Other specified disorders of Eustachian tube, unspecified ear: Secondary | ICD-10-CM | POA: Diagnosis not present

## 2017-05-20 DIAGNOSIS — R262 Difficulty in walking, not elsewhere classified: Secondary | ICD-10-CM | POA: Diagnosis not present

## 2017-05-20 DIAGNOSIS — M6281 Muscle weakness (generalized): Secondary | ICD-10-CM | POA: Diagnosis not present

## 2017-05-23 DIAGNOSIS — M6281 Muscle weakness (generalized): Secondary | ICD-10-CM | POA: Diagnosis not present

## 2017-05-23 DIAGNOSIS — R262 Difficulty in walking, not elsewhere classified: Secondary | ICD-10-CM | POA: Diagnosis not present

## 2017-05-28 DIAGNOSIS — R262 Difficulty in walking, not elsewhere classified: Secondary | ICD-10-CM | POA: Diagnosis not present

## 2017-05-28 DIAGNOSIS — M6281 Muscle weakness (generalized): Secondary | ICD-10-CM | POA: Diagnosis not present

## 2017-05-31 DIAGNOSIS — M6281 Muscle weakness (generalized): Secondary | ICD-10-CM | POA: Diagnosis not present

## 2017-05-31 DIAGNOSIS — R262 Difficulty in walking, not elsewhere classified: Secondary | ICD-10-CM | POA: Diagnosis not present

## 2017-06-04 DIAGNOSIS — R262 Difficulty in walking, not elsewhere classified: Secondary | ICD-10-CM | POA: Diagnosis not present

## 2017-06-04 DIAGNOSIS — M6281 Muscle weakness (generalized): Secondary | ICD-10-CM | POA: Diagnosis not present

## 2017-06-07 DIAGNOSIS — R262 Difficulty in walking, not elsewhere classified: Secondary | ICD-10-CM | POA: Diagnosis not present

## 2017-06-07 DIAGNOSIS — M6281 Muscle weakness (generalized): Secondary | ICD-10-CM | POA: Diagnosis not present

## 2017-06-10 DIAGNOSIS — K831 Obstruction of bile duct: Secondary | ICD-10-CM | POA: Diagnosis not present

## 2017-06-10 DIAGNOSIS — E039 Hypothyroidism, unspecified: Secondary | ICD-10-CM | POA: Diagnosis not present

## 2017-06-10 DIAGNOSIS — F332 Major depressive disorder, recurrent severe without psychotic features: Secondary | ICD-10-CM | POA: Diagnosis not present

## 2017-06-11 DIAGNOSIS — R262 Difficulty in walking, not elsewhere classified: Secondary | ICD-10-CM | POA: Diagnosis not present

## 2017-06-11 DIAGNOSIS — M6281 Muscle weakness (generalized): Secondary | ICD-10-CM | POA: Diagnosis not present

## 2017-06-14 DIAGNOSIS — R262 Difficulty in walking, not elsewhere classified: Secondary | ICD-10-CM | POA: Diagnosis not present

## 2017-06-14 DIAGNOSIS — M6281 Muscle weakness (generalized): Secondary | ICD-10-CM | POA: Diagnosis not present

## 2017-06-17 DIAGNOSIS — F332 Major depressive disorder, recurrent severe without psychotic features: Secondary | ICD-10-CM | POA: Diagnosis not present

## 2017-06-17 DIAGNOSIS — G301 Alzheimer's disease with late onset: Secondary | ICD-10-CM | POA: Diagnosis not present

## 2017-06-17 DIAGNOSIS — F0281 Dementia in other diseases classified elsewhere with behavioral disturbance: Secondary | ICD-10-CM | POA: Diagnosis not present

## 2017-06-17 DIAGNOSIS — I10 Essential (primary) hypertension: Secondary | ICD-10-CM | POA: Diagnosis not present

## 2017-06-17 DIAGNOSIS — E039 Hypothyroidism, unspecified: Secondary | ICD-10-CM | POA: Diagnosis not present

## 2017-06-17 DIAGNOSIS — Z23 Encounter for immunization: Secondary | ICD-10-CM | POA: Diagnosis not present

## 2017-06-18 DIAGNOSIS — M6281 Muscle weakness (generalized): Secondary | ICD-10-CM | POA: Diagnosis not present

## 2017-06-18 DIAGNOSIS — R262 Difficulty in walking, not elsewhere classified: Secondary | ICD-10-CM | POA: Diagnosis not present

## 2017-06-21 DIAGNOSIS — R262 Difficulty in walking, not elsewhere classified: Secondary | ICD-10-CM | POA: Diagnosis not present

## 2017-06-21 DIAGNOSIS — M6281 Muscle weakness (generalized): Secondary | ICD-10-CM | POA: Diagnosis not present

## 2017-06-25 DIAGNOSIS — R262 Difficulty in walking, not elsewhere classified: Secondary | ICD-10-CM | POA: Diagnosis not present

## 2017-06-25 DIAGNOSIS — M6281 Muscle weakness (generalized): Secondary | ICD-10-CM | POA: Diagnosis not present

## 2017-06-28 DIAGNOSIS — M6281 Muscle weakness (generalized): Secondary | ICD-10-CM | POA: Diagnosis not present

## 2017-06-28 DIAGNOSIS — R262 Difficulty in walking, not elsewhere classified: Secondary | ICD-10-CM | POA: Diagnosis not present

## 2017-07-02 DIAGNOSIS — M545 Low back pain: Secondary | ICD-10-CM | POA: Diagnosis not present

## 2017-07-02 DIAGNOSIS — R262 Difficulty in walking, not elsewhere classified: Secondary | ICD-10-CM | POA: Diagnosis not present

## 2017-07-02 DIAGNOSIS — M6281 Muscle weakness (generalized): Secondary | ICD-10-CM | POA: Diagnosis not present

## 2017-07-02 DIAGNOSIS — R2681 Unsteadiness on feet: Secondary | ICD-10-CM | POA: Diagnosis not present

## 2017-07-05 DIAGNOSIS — M6281 Muscle weakness (generalized): Secondary | ICD-10-CM | POA: Diagnosis not present

## 2017-07-05 DIAGNOSIS — R262 Difficulty in walking, not elsewhere classified: Secondary | ICD-10-CM | POA: Diagnosis not present

## 2017-07-08 DIAGNOSIS — F329 Major depressive disorder, single episode, unspecified: Secondary | ICD-10-CM | POA: Diagnosis not present

## 2017-07-08 DIAGNOSIS — M5417 Radiculopathy, lumbosacral region: Secondary | ICD-10-CM | POA: Diagnosis not present

## 2017-07-08 DIAGNOSIS — F1120 Opioid dependence, uncomplicated: Secondary | ICD-10-CM | POA: Diagnosis not present

## 2017-07-08 DIAGNOSIS — R5383 Other fatigue: Secondary | ICD-10-CM | POA: Diagnosis not present

## 2017-07-08 DIAGNOSIS — Z79899 Other long term (current) drug therapy: Secondary | ICD-10-CM | POA: Diagnosis not present

## 2017-07-08 DIAGNOSIS — M545 Low back pain: Secondary | ICD-10-CM | POA: Diagnosis not present

## 2017-07-08 DIAGNOSIS — F4310 Post-traumatic stress disorder, unspecified: Secondary | ICD-10-CM | POA: Diagnosis not present

## 2017-07-08 DIAGNOSIS — F1920 Other psychoactive substance dependence, uncomplicated: Secondary | ICD-10-CM | POA: Diagnosis not present

## 2017-07-08 DIAGNOSIS — J449 Chronic obstructive pulmonary disease, unspecified: Secondary | ICD-10-CM | POA: Diagnosis not present

## 2017-07-08 DIAGNOSIS — I10 Essential (primary) hypertension: Secondary | ICD-10-CM | POA: Diagnosis not present

## 2017-07-09 DIAGNOSIS — M6281 Muscle weakness (generalized): Secondary | ICD-10-CM | POA: Diagnosis not present

## 2017-07-09 DIAGNOSIS — R2681 Unsteadiness on feet: Secondary | ICD-10-CM | POA: Diagnosis not present

## 2017-07-09 DIAGNOSIS — R262 Difficulty in walking, not elsewhere classified: Secondary | ICD-10-CM | POA: Diagnosis not present

## 2017-07-12 DIAGNOSIS — R262 Difficulty in walking, not elsewhere classified: Secondary | ICD-10-CM | POA: Diagnosis not present

## 2017-07-12 DIAGNOSIS — M6281 Muscle weakness (generalized): Secondary | ICD-10-CM | POA: Diagnosis not present

## 2017-07-15 DIAGNOSIS — M6281 Muscle weakness (generalized): Secondary | ICD-10-CM | POA: Diagnosis not present

## 2017-07-15 DIAGNOSIS — R262 Difficulty in walking, not elsewhere classified: Secondary | ICD-10-CM | POA: Diagnosis not present

## 2017-07-16 DIAGNOSIS — M6281 Muscle weakness (generalized): Secondary | ICD-10-CM | POA: Diagnosis not present

## 2017-07-16 DIAGNOSIS — R262 Difficulty in walking, not elsewhere classified: Secondary | ICD-10-CM | POA: Diagnosis not present

## 2017-07-23 DIAGNOSIS — M6281 Muscle weakness (generalized): Secondary | ICD-10-CM | POA: Diagnosis not present

## 2017-07-23 DIAGNOSIS — R262 Difficulty in walking, not elsewhere classified: Secondary | ICD-10-CM | POA: Diagnosis not present

## 2017-07-26 DIAGNOSIS — R262 Difficulty in walking, not elsewhere classified: Secondary | ICD-10-CM | POA: Diagnosis not present

## 2017-07-26 DIAGNOSIS — M6281 Muscle weakness (generalized): Secondary | ICD-10-CM | POA: Diagnosis not present

## 2017-07-30 DIAGNOSIS — M6281 Muscle weakness (generalized): Secondary | ICD-10-CM | POA: Diagnosis not present

## 2017-07-30 DIAGNOSIS — R262 Difficulty in walking, not elsewhere classified: Secondary | ICD-10-CM | POA: Diagnosis not present

## 2017-08-02 DIAGNOSIS — R262 Difficulty in walking, not elsewhere classified: Secondary | ICD-10-CM | POA: Diagnosis not present

## 2017-08-02 DIAGNOSIS — M6281 Muscle weakness (generalized): Secondary | ICD-10-CM | POA: Diagnosis not present

## 2017-08-07 DIAGNOSIS — M6281 Muscle weakness (generalized): Secondary | ICD-10-CM | POA: Diagnosis not present

## 2017-08-07 DIAGNOSIS — R262 Difficulty in walking, not elsewhere classified: Secondary | ICD-10-CM | POA: Diagnosis not present

## 2017-08-09 DIAGNOSIS — R262 Difficulty in walking, not elsewhere classified: Secondary | ICD-10-CM | POA: Diagnosis not present

## 2017-08-09 DIAGNOSIS — M6281 Muscle weakness (generalized): Secondary | ICD-10-CM | POA: Diagnosis not present

## 2017-08-13 DIAGNOSIS — M79674 Pain in right toe(s): Secondary | ICD-10-CM | POA: Diagnosis not present

## 2017-08-13 DIAGNOSIS — R262 Difficulty in walking, not elsewhere classified: Secondary | ICD-10-CM | POA: Diagnosis not present

## 2017-08-13 DIAGNOSIS — M6281 Muscle weakness (generalized): Secondary | ICD-10-CM | POA: Diagnosis not present

## 2017-08-13 DIAGNOSIS — B351 Tinea unguium: Secondary | ICD-10-CM | POA: Diagnosis not present

## 2017-08-13 DIAGNOSIS — M79675 Pain in left toe(s): Secondary | ICD-10-CM | POA: Diagnosis not present

## 2017-08-16 DIAGNOSIS — M6281 Muscle weakness (generalized): Secondary | ICD-10-CM | POA: Diagnosis not present

## 2017-08-16 DIAGNOSIS — R262 Difficulty in walking, not elsewhere classified: Secondary | ICD-10-CM | POA: Diagnosis not present

## 2017-08-28 DIAGNOSIS — R262 Difficulty in walking, not elsewhere classified: Secondary | ICD-10-CM | POA: Diagnosis not present

## 2017-08-28 DIAGNOSIS — M6281 Muscle weakness (generalized): Secondary | ICD-10-CM | POA: Diagnosis not present

## 2017-08-30 DIAGNOSIS — M6281 Muscle weakness (generalized): Secondary | ICD-10-CM | POA: Diagnosis not present

## 2017-08-30 DIAGNOSIS — R262 Difficulty in walking, not elsewhere classified: Secondary | ICD-10-CM | POA: Diagnosis not present

## 2017-09-03 DIAGNOSIS — R262 Difficulty in walking, not elsewhere classified: Secondary | ICD-10-CM | POA: Diagnosis not present

## 2017-09-03 DIAGNOSIS — M6281 Muscle weakness (generalized): Secondary | ICD-10-CM | POA: Diagnosis not present

## 2017-09-06 DIAGNOSIS — R262 Difficulty in walking, not elsewhere classified: Secondary | ICD-10-CM | POA: Diagnosis not present

## 2017-09-06 DIAGNOSIS — M6281 Muscle weakness (generalized): Secondary | ICD-10-CM | POA: Diagnosis not present

## 2017-09-25 ENCOUNTER — Emergency Department: Payer: PPO

## 2017-09-25 ENCOUNTER — Other Ambulatory Visit: Payer: Self-pay

## 2017-09-25 ENCOUNTER — Inpatient Hospital Stay
Admission: EM | Admit: 2017-09-25 | Discharge: 2017-09-30 | DRG: 481 | Disposition: A | Discharge status: Skilled Nursing Facility | Payer: PPO | Attending: Internal Medicine | Admitting: Internal Medicine

## 2017-09-25 DIAGNOSIS — R509 Fever, unspecified: Secondary | ICD-10-CM

## 2017-09-25 DIAGNOSIS — S0990XA Unspecified injury of head, initial encounter: Secondary | ICD-10-CM

## 2017-09-25 DIAGNOSIS — Z85038 Personal history of other malignant neoplasm of large intestine: Secondary | ICD-10-CM | POA: Diagnosis not present

## 2017-09-25 DIAGNOSIS — Z79891 Long term (current) use of opiate analgesic: Secondary | ICD-10-CM

## 2017-09-25 DIAGNOSIS — F329 Major depressive disorder, single episode, unspecified: Secondary | ICD-10-CM | POA: Diagnosis present

## 2017-09-25 DIAGNOSIS — J9811 Atelectasis: Secondary | ICD-10-CM | POA: Diagnosis not present

## 2017-09-25 DIAGNOSIS — Z823 Family history of stroke: Secondary | ICD-10-CM

## 2017-09-25 DIAGNOSIS — D62 Acute posthemorrhagic anemia: Secondary | ICD-10-CM | POA: Diagnosis not present

## 2017-09-25 DIAGNOSIS — S72002A Fracture of unspecified part of neck of left femur, initial encounter for closed fracture: Secondary | ICD-10-CM | POA: Diagnosis present

## 2017-09-25 DIAGNOSIS — Z8249 Family history of ischemic heart disease and other diseases of the circulatory system: Secondary | ICD-10-CM | POA: Diagnosis not present

## 2017-09-25 DIAGNOSIS — S7222XA Displaced subtrochanteric fracture of left femur, initial encounter for closed fracture: Principal | ICD-10-CM | POA: Diagnosis present

## 2017-09-25 DIAGNOSIS — K219 Gastro-esophageal reflux disease without esophagitis: Secondary | ICD-10-CM | POA: Diagnosis not present

## 2017-09-25 DIAGNOSIS — M84459A Pathological fracture, hip, unspecified, initial encounter for fracture: Secondary | ICD-10-CM | POA: Diagnosis not present

## 2017-09-25 DIAGNOSIS — Z781 Physical restraint status: Secondary | ICD-10-CM | POA: Diagnosis not present

## 2017-09-25 DIAGNOSIS — E039 Hypothyroidism, unspecified: Secondary | ICD-10-CM | POA: Diagnosis not present

## 2017-09-25 DIAGNOSIS — D510 Vitamin B12 deficiency anemia due to intrinsic factor deficiency: Secondary | ICD-10-CM | POA: Diagnosis not present

## 2017-09-25 DIAGNOSIS — N390 Urinary tract infection, site not specified: Secondary | ICD-10-CM | POA: Diagnosis not present

## 2017-09-25 DIAGNOSIS — W010XXA Fall on same level from slipping, tripping and stumbling without subsequent striking against object, initial encounter: Secondary | ICD-10-CM | POA: Diagnosis present

## 2017-09-25 DIAGNOSIS — Y92481 Parking lot as the place of occurrence of the external cause: Secondary | ICD-10-CM | POA: Diagnosis not present

## 2017-09-25 DIAGNOSIS — I739 Peripheral vascular disease, unspecified: Secondary | ICD-10-CM | POA: Diagnosis not present

## 2017-09-25 DIAGNOSIS — F0281 Dementia in other diseases classified elsewhere with behavioral disturbance: Secondary | ICD-10-CM | POA: Diagnosis present

## 2017-09-25 DIAGNOSIS — Z7401 Bed confinement status: Secondary | ICD-10-CM | POA: Diagnosis not present

## 2017-09-25 DIAGNOSIS — S0101XA Laceration without foreign body of scalp, initial encounter: Secondary | ICD-10-CM | POA: Diagnosis present

## 2017-09-25 DIAGNOSIS — Z79890 Hormone replacement therapy (postmenopausal): Secondary | ICD-10-CM

## 2017-09-25 DIAGNOSIS — E876 Hypokalemia: Secondary | ICD-10-CM | POA: Diagnosis not present

## 2017-09-25 DIAGNOSIS — B379 Candidiasis, unspecified: Secondary | ICD-10-CM | POA: Diagnosis not present

## 2017-09-25 DIAGNOSIS — F418 Other specified anxiety disorders: Secondary | ICD-10-CM | POA: Diagnosis not present

## 2017-09-25 DIAGNOSIS — E785 Hyperlipidemia, unspecified: Secondary | ICD-10-CM | POA: Diagnosis not present

## 2017-09-25 DIAGNOSIS — S72002D Fracture of unspecified part of neck of left femur, subsequent encounter for closed fracture with routine healing: Secondary | ICD-10-CM | POA: Diagnosis not present

## 2017-09-25 DIAGNOSIS — R079 Chest pain, unspecified: Secondary | ICD-10-CM | POA: Diagnosis not present

## 2017-09-25 DIAGNOSIS — G8929 Other chronic pain: Secondary | ICD-10-CM | POA: Diagnosis not present

## 2017-09-25 DIAGNOSIS — F419 Anxiety disorder, unspecified: Secondary | ICD-10-CM | POA: Diagnosis not present

## 2017-09-25 DIAGNOSIS — F0390 Unspecified dementia without behavioral disturbance: Secondary | ICD-10-CM | POA: Diagnosis not present

## 2017-09-25 DIAGNOSIS — I10 Essential (primary) hypertension: Secondary | ICD-10-CM | POA: Diagnosis not present

## 2017-09-25 DIAGNOSIS — F0150 Vascular dementia without behavioral disturbance: Secondary | ICD-10-CM | POA: Diagnosis not present

## 2017-09-25 DIAGNOSIS — Z833 Family history of diabetes mellitus: Secondary | ICD-10-CM | POA: Diagnosis not present

## 2017-09-25 DIAGNOSIS — F3289 Other specified depressive episodes: Secondary | ICD-10-CM | POA: Diagnosis not present

## 2017-09-25 DIAGNOSIS — H903 Sensorineural hearing loss, bilateral: Secondary | ICD-10-CM | POA: Diagnosis not present

## 2017-09-25 DIAGNOSIS — R51 Headache: Secondary | ICD-10-CM | POA: Diagnosis not present

## 2017-09-25 DIAGNOSIS — G301 Alzheimer's disease with late onset: Secondary | ICD-10-CM | POA: Diagnosis present

## 2017-09-25 DIAGNOSIS — M6281 Muscle weakness (generalized): Secondary | ICD-10-CM | POA: Diagnosis not present

## 2017-09-25 DIAGNOSIS — G4709 Other insomnia: Secondary | ICD-10-CM | POA: Diagnosis not present

## 2017-09-25 DIAGNOSIS — Z419 Encounter for procedure for purposes other than remedying health state, unspecified: Secondary | ICD-10-CM

## 2017-09-25 DIAGNOSIS — M25551 Pain in right hip: Secondary | ICD-10-CM | POA: Diagnosis not present

## 2017-09-25 DIAGNOSIS — H6123 Impacted cerumen, bilateral: Secondary | ICD-10-CM | POA: Diagnosis not present

## 2017-09-25 DIAGNOSIS — D649 Anemia, unspecified: Secondary | ICD-10-CM | POA: Diagnosis not present

## 2017-09-25 LAB — CBC WITH DIFFERENTIAL/PLATELET
Basophils Absolute: 0.1 10*3/uL (ref 0–0.1)
Basophils Relative: 1 %
EOS PCT: 1 %
Eosinophils Absolute: 0.1 10*3/uL (ref 0–0.7)
HEMATOCRIT: 36.8 % (ref 35.0–47.0)
Hemoglobin: 12.1 g/dL (ref 12.0–16.0)
LYMPHS ABS: 1.3 10*3/uL (ref 1.0–3.6)
LYMPHS PCT: 14 %
MCH: 28.7 pg (ref 26.0–34.0)
MCHC: 33 g/dL (ref 32.0–36.0)
MCV: 87 fL (ref 80.0–100.0)
MONO ABS: 0.5 10*3/uL (ref 0.2–0.9)
Monocytes Relative: 5 %
NEUTROS ABS: 7 10*3/uL — AB (ref 1.4–6.5)
Neutrophils Relative %: 79 %
PLATELETS: 255 10*3/uL (ref 150–440)
RBC: 4.23 MIL/uL (ref 3.80–5.20)
RDW: 14.2 % (ref 11.5–14.5)
WBC: 8.9 10*3/uL (ref 3.6–11.0)

## 2017-09-25 LAB — COMPREHENSIVE METABOLIC PANEL
ALT: 11 U/L — ABNORMAL LOW (ref 14–54)
AST: 30 U/L (ref 15–41)
Albumin: 3.4 g/dL — ABNORMAL LOW (ref 3.5–5.0)
Alkaline Phosphatase: 103 U/L (ref 38–126)
Anion gap: 13 (ref 5–15)
BILIRUBIN TOTAL: 0.7 mg/dL (ref 0.3–1.2)
BUN: 14 mg/dL (ref 6–20)
CHLORIDE: 110 mmol/L (ref 101–111)
CO2: 23 mmol/L (ref 22–32)
CREATININE: 1.03 mg/dL — AB (ref 0.44–1.00)
Calcium: 8.9 mg/dL (ref 8.9–10.3)
GFR, EST AFRICAN AMERICAN: 53 mL/min — AB (ref >60)
GFR, EST NON AFRICAN AMERICAN: 46 mL/min — AB (ref >60)
Glucose, Bld: 196 mg/dL — ABNORMAL HIGH (ref 65–99)
POTASSIUM: 3.4 mmol/L — AB (ref 3.5–5.1)
Sodium: 146 mmol/L — ABNORMAL HIGH (ref 135–145)
Total Protein: 6.5 g/dL (ref 6.5–8.1)

## 2017-09-25 MED ORDER — BISACODYL 5 MG PO TBEC: 5.0000 mg | DELAYED_RELEASE_TABLET | Freq: Every day | ORAL | Status: DC | PRN

## 2017-09-25 MED ORDER — SERTRALINE HCL 50 MG PO TABS
25.0000 mg | ORAL_TABLET | Freq: Every day | ORAL | Status: DC
  Administered 2017-09-27 – 2017-09-29 (×3): 25 mg via ORAL
  Filled 2017-09-25 (×2): qty 1
  Filled 2017-09-25: qty 0.5
  Filled 2017-09-25: qty 1

## 2017-09-25 MED ORDER — DONEPEZIL HCL 5 MG PO TABS
5.0000 mg | ORAL_TABLET | Freq: Every day | ORAL | Status: DC
  Administered 2017-09-27 – 2017-09-30 (×3): 5 mg via ORAL
  Filled 2017-09-25 (×5): qty 1

## 2017-09-25 MED ORDER — TRAZODONE HCL 50 MG PO TABS
25.0000 mg | ORAL_TABLET | Freq: Every evening | ORAL | Status: DC | PRN
  Administered 2017-09-26: 25 mg via ORAL
  Filled 2017-09-25: qty 1

## 2017-09-25 MED ORDER — LEVOTHYROXINE SODIUM 25 MCG PO TABS
25.0000 ug | ORAL_TABLET | Freq: Every day | ORAL | Status: DC
  Administered 2017-09-27 – 2017-09-30 (×4): 25 ug via ORAL
  Filled 2017-09-25 (×4): qty 1

## 2017-09-25 MED ORDER — MELATONIN 5 MG PO TABS
5.0000 mg | ORAL_TABLET | Freq: Every evening | ORAL | Status: DC | PRN
  Administered 2017-09-28: 5 mg via ORAL
  Filled 2017-09-25 (×2): qty 1

## 2017-09-25 MED ORDER — FENTANYL CITRATE (PF) 100 MCG/2ML IJ SOLN
50.0000 ug | INTRAMUSCULAR | Status: DC | PRN
  Administered 2017-09-25 (×3): 50 ug via INTRAVENOUS
  Filled 2017-09-25 (×2): qty 2

## 2017-09-25 MED ORDER — ACETAMINOPHEN 650 MG RE SUPP: 650.0000 mg | Freq: Four times a day (QID) | RECTAL | Status: DC | PRN

## 2017-09-25 MED ORDER — LIDOCAINE HCL (PF) 1 % IJ SOLN
5.0000 mL | Freq: Once | INTRAMUSCULAR | Status: AC
  Administered 2017-09-25: 5 mL via INTRADERMAL
  Filled 2017-09-25: qty 5

## 2017-09-25 MED ORDER — MIRTAZAPINE 15 MG PO TABS
30.0000 mg | ORAL_TABLET | Freq: Every day | ORAL | Status: DC
  Administered 2017-09-26 – 2017-09-29 (×5): 30 mg via ORAL
  Filled 2017-09-25 (×5): qty 2

## 2017-09-25 MED ORDER — HYDROCODONE-ACETAMINOPHEN 5-325 MG PO TABS
1.0000 | ORAL_TABLET | ORAL | Status: DC | PRN
  Administered 2017-09-26: 1 via ORAL
  Filled 2017-09-25: qty 1

## 2017-09-25 MED ORDER — DOCUSATE SODIUM 100 MG PO CAPS
100.0000 mg | ORAL_CAPSULE | Freq: Two times a day (BID) | ORAL | Status: DC
  Administered 2017-09-26 – 2017-09-30 (×9): 100 mg via ORAL
  Filled 2017-09-25 (×9): qty 1

## 2017-09-25 MED ORDER — FENTANYL 12 MCG/HR TD PT72
12.5000 ug | MEDICATED_PATCH | TRANSDERMAL | Status: DC
  Administered 2017-09-26 – 2017-09-29 (×3): 12.5 ug via TRANSDERMAL
  Filled 2017-09-25 (×3): qty 1

## 2017-09-25 MED ORDER — VITAMIN B-12 1000 MCG PO TABS
1000.0000 ug | ORAL_TABLET | Freq: Every day | ORAL | Status: DC
  Administered 2017-09-27 – 2017-09-30 (×3): 1000 ug via ORAL
  Filled 2017-09-25 (×3): qty 1

## 2017-09-25 MED ORDER — HYDROCODONE-ACETAMINOPHEN 10-325 MG PO TABS
1.0000 | ORAL_TABLET | Freq: Every day | ORAL | Status: DC | PRN
  Administered 2017-09-26: 1 via ORAL
  Filled 2017-09-25: qty 1

## 2017-09-25 MED ORDER — PANTOPRAZOLE SODIUM 40 MG PO TBEC
40.0000 mg | DELAYED_RELEASE_TABLET | Freq: Every day | ORAL | Status: DC
  Administered 2017-09-27 – 2017-09-30 (×4): 40 mg via ORAL
  Filled 2017-09-25 (×4): qty 1

## 2017-09-25 MED ORDER — ONDANSETRON HCL 4 MG/2ML IJ SOLN: 4.0000 mg | Freq: Four times a day (QID) | INTRAMUSCULAR | Status: DC | PRN

## 2017-09-25 MED ORDER — ACETAMINOPHEN 325 MG PO TABS
650.0000 mg | ORAL_TABLET | Freq: Four times a day (QID) | ORAL | Status: DC | PRN
  Administered 2017-09-29: 650 mg via ORAL
  Filled 2017-09-25: qty 2

## 2017-09-25 MED ORDER — ATENOLOL 25 MG PO TABS
50.0000 mg | ORAL_TABLET | Freq: Every day | ORAL | Status: DC
  Administered 2017-09-27 – 2017-09-30 (×3): 50 mg via ORAL
  Filled 2017-09-25 (×4): qty 2

## 2017-09-25 MED ORDER — ONDANSETRON HCL 4 MG PO TABS: 4.0000 mg | ORAL_TABLET | Freq: Four times a day (QID) | ORAL | Status: DC | PRN

## 2017-09-25 MED ORDER — SODIUM CHLORIDE 0.9 % IV SOLN
INTRAVENOUS | Status: DC
  Administered 2017-09-25 – 2017-09-27 (×4): via INTRAVENOUS
  Administered 2017-09-27: 75 mL/h via INTRAVENOUS

## 2017-09-25 NOTE — ED Notes (Signed)
[name] (daughter) [phone] Richardson Landry (son)  [phone]  For information call the daughter, if family needs to come quickly, call the son.

## 2017-09-25 NOTE — ED Provider Notes (Signed)
[facility] Emergency Department Provider Note    First MD Initiated Contact with Patient 09/25/17 1907     (approximate)  I have reviewed the triage vital signs and the nursing notes.   HISTORY  Chief Complaint Fall - hip pain  Level V Caveat:  dementia  HPI Kerri Rangel is a 84 y.o. female presents to the ER with chief complaint of acute moderate to severe left hip pain after mechanical fall and patient unable to ambulate.  Did fall landing on left side and hit the back of her head.  Does not member the details of the fall.  Denies any chest pain or shortness of breath.  No abdominal pain.  Does have a history of dementia limiting some of the history.  Past Medical History:  Diagnosis Date  . Anemia   . Anxiety 08/07/2016  . Cancer ([place])    colon ca  . Common bile duct (CBD) obstruction 08/01/2016   Overview:  Possible stone noted 07/26/16 - refer to GI. LFTs nml but does have chronic abd pain   . Dementia   . Depression 08/07/2016  . Dizzy 03/02/2014  . Edema 03/02/2014  . Gallstone 08/01/2016  . GERD (gastroesophageal reflux disease)   . Heart disease   . Hyperlipidemia, unspecified 08/07/2016  . Hypertension 08/07/2016  . Hypothyroidism 08/07/2016  . Late onset Alzheimer's disease with behavioral disturbance 03/02/2015  . Osteoporosis   . Vitamin D deficiency    Family History  Problem Relation Age of Onset  . Diabetes Mother   . Heart attack Mother   . Cancer Sister   . Diabetes Sister   . Alcohol abuse Brother   . Diabetes Brother   . Heart attack Brother   . Stroke Brother    Past Surgical History:  Procedure Laterality Date  . ABDOMINAL HYSTERECTOMY    . APPENDECTOMY    . Back sugery  03/2002   lumbar disk and fusion. Dr. Mauri Pole   . BREAST EXCISIONAL BIOPSY     Benign. Dr. Pat   . BREAST LUMPECTOMY N/A 1971   unsure  . CATARACT EXTRACTION Bilateral   . colon cancer resection  1990s   By Dr. Pat   . ERCP N/A 08/28/2016   Procedure: ENDOSCOPIC RETROGRADE CHOLANGIOPANCREATOGRAPHY (ERCP);  Surgeon: Lucilla Lame, MD;  Location: [facility] ENDOSCOPY;  Service: Endoscopy;  Laterality: N/A;  . HERNIA REPAIR  1995   ventral  . JOINT REPLACEMENT    . OOPHORECTOMY    . SPINE SURGERY     Patient Active Problem List   Diagnosis Date Noted  . Closed left hip fracture ([place]) 09/25/2017  . Carotid artery disease ([place]) 11/06/2016  . Anxiety 08/07/2016  . Depression 08/07/2016  . Hyperlipidemia, unspecified 08/07/2016  . Hypertension 08/07/2016  . Hypothyroidism 08/07/2016  . Vitamin D deficiency   . Dementia   . Common bile duct (CBD) obstruction 08/01/2016  . Gallstone 08/01/2016  . Late onset Alzheimer's disease with behavioral disturbance 03/02/2015  . Dizzy 03/02/2014  . Edema 03/02/2014      Prior to Admission medications   Medication Sig Start Date End Date Taking? Authorizing Provider  atenolol (TENORMIN) 50 MG tablet Take 50 mg by mouth daily.    Yes [provider]  Cyanocobalamin (RA VITAMIN B-12 TR) 1000 MCG TBCR Take 1,000 mcg by mouth daily.    Yes [provider]  donepezil (ARICEPT) 5 MG tablet Take 5 mg by mouth daily at 12 noon.    Yes [provider]  fentaNYL (DURAGESIC - DOSED MCG/HR) 12 MCG/HR Place 12.5 mcg onto the skin.  01/15/14  Yes [provider]  HYDROcodone-acetaminophen (NORCO) 10-325 MG tablet Take 1 tablet by mouth daily as needed for moderate pain.    Yes [provider]  levothyroxine (SYNTHROID, LEVOTHROID) 25 MCG tablet Take 25 mcg by mouth daily before breakfast.  04/27/16  Yes [provider]  Melatonin 5 MG TABS Take 5 mg by mouth at bedtime as needed (sleep).   Yes [provider]  mirtazapine (REMERON) 30 MG tablet Take 30 mg by mouth at bedtime.    Yes [provider]  pantoprazole (PROTONIX) 40 MG tablet Take 40 mg by mouth daily.   Yes [provider]  sertraline (ZOLOFT) 25 MG tablet Take 25 mg by  mouth daily.   Yes [provider]    Allergies Patient has no known allergies.    Social History Social History   Tobacco Use  . Smoking status: Never Smoker  . Smokeless tobacco: Never Used  Substance Use Topics  . Alcohol use: No  . Drug use: No    Review of Systems Patient denies headaches, rhinorrhea, blurry vision, numbness, shortness of breath, chest pain, edema, cough, abdominal pain, nausea, vomiting, diarrhea, dysuria, fevers, rashes or hallucinations unless otherwise stated above in HPI. ____________________________________________   PHYSICAL EXAM:  VITAL SIGNS: Vitals:   09/25/17 2130 09/25/17 2200  BP: (!) 141/65 (!) 157/71  Pulse: 62 (!) 109  Resp: 16 (!) 22  SpO2: 97% 94%    Constitutional: Alert anxious appearing but  in no acute distress. Eyes: Conjunctivae are normal.  Head: Posterior scalp hematoma with tiny 1-2 mm well approximated laceration that is hemostatic. Nose: No congestion/rhinnorhea. Mouth/Throat: Mucous membranes are moist.   Neck: No stridor. Painless ROM.  Cardiovascular: Normal rate, regular rhythm. Grossly normal heart sounds.  Good peripheral circulation. Respiratory: Normal respiratory effort.  No retractions. Lungs CTAB. Gastrointestinal: Soft and nontender. No distention. No abdominal bruits. No CVA tenderness. Genitourinary:  Musculoskeletal: left leg shortened and externally rotated, ttp with log roll of left leg. No joint effusions. Neurologic:  Normal speech and language. No gross focal neurologic deficits are appreciated. No facial droop Skin:  Skin is warm, dry and intact. No rash noted. Psychiatric: anxious appearing  ____________________________________________   LABS (all labs ordered are listed, but only abnormal results are displayed)  Results for orders placed or performed during the hospital encounter of 09/25/17 (from the past 24 hour(s))  CBC with Differential/Platelet     Status: Abnormal    Collection Time: 09/25/17  7:56 PM  Result Value Ref Range   WBC 8.9 3.6 - 11.0 K/uL   RBC 4.23 3.80 - 5.20 MIL/uL   Hemoglobin 12.1 12.0 - 16.0 g/dL   HCT 36.8 35.0 - 47.0 %   MCV 87.0 80.0 - 100.0 fL   MCH 28.7 26.0 - 34.0 pg   MCHC 33.0 32.0 - 36.0 g/dL   RDW 14.2 11.5 - 14.5 %   Platelets 255 150 - 440 K/uL   Neutrophils Relative % 79 %   Neutro Abs 7.0 (H) 1.4 - 6.5 K/uL   Lymphocytes Relative 14 %   Lymphs Abs 1.3 1.0 - 3.6 K/uL   Monocytes Relative 5 %   Monocytes Absolute 0.5 0.2 - 0.9 K/uL   Eosinophils Relative 1 %   Eosinophils Absolute 0.1 0 - 0.7 K/uL   Basophils Relative 1 %   Basophils Absolute 0.1 0 -  0.1 K/uL  Comprehensive metabolic panel     Status: Abnormal   Collection Time: 09/25/17  7:56 PM  Result Value Ref Range   Sodium 146 (H) 135 - 145 mmol/L   Potassium 3.4 (L) 3.5 - 5.1 mmol/L   Chloride 110 101 - 111 mmol/L   CO2 23 22 - 32 mmol/L   Glucose, Bld 196 (H) 65 - 99 mg/dL   BUN 14 6 - 20 mg/dL   Creatinine, Ser 1.03 (H) 0.44 - 1.00 mg/dL   Calcium 8.9 8.9 - 10.3 mg/dL   Total Protein 6.5 6.5 - 8.1 g/dL   Albumin 3.4 (L) 3.5 - 5.0 g/dL   AST 30 15 - 41 U/L   ALT 11 (L) 14 - 54 U/L   Alkaline Phosphatase 103 38 - 126 U/L   Total Bilirubin 0.7 0.3 - 1.2 mg/dL   GFR calc non Af Amer 46 (L) >60 mL/min   GFR calc Af Amer 53 (L) >60 mL/min   Anion gap 13 5 - 15   ____________________________________________  EKG My review and personal interpretation at Time: 20:03   Indication: fall  Rate: 70  Rhythm: sinus with prolonged pr Axis: normal Other: normal intervals otherwise, no stemi ____________________________________________  RADIOLOGY  I personally reviewed all radiographic images ordered to evaluate for the above acute complaints and reviewed radiology reports and findings.  These findings were personally discussed with the patient.  Please see medical record for radiology  report.  ____________________________________________   PROCEDURES  Procedure(s) performed:  Procedures     Critical Care performed: no ____________________________________________   INITIAL IMPRESSION / ASSESSMENT AND PLAN / ED COURSE  Pertinent labs & imaging results that were available during my care of the patient were reviewed by me and considered in my medical decision making (see chart for details).  DDX: fracture, contusion, dislocation, sdh,iph  Kerri Rangel is a 84 y.o. who presents to the ED with evidence of head injury with small scalp laceration with no underlying fracture or acute intracranial abnormality also with evidence of left intertrochanteric fracture that is reverse oblique pattern causing her acute pain.  Neurovascularly intact distally.  Blood work sent for the above differential.  EKG shows no ischemia.  Patient will be admitted for pain control medical management and orthopedic evaluation.  I spoke with Dr. Harlow Mares of orthopedics.  Have discussed with the patient and available family all diagnostics and treatments performed thus far and all questions were answered to the best of my ability. The patient demonstrates understanding and agreement with plan.       ____________________________________________   FINAL CLINICAL IMPRESSION(S) / ED DIAGNOSES  Final diagnoses:  Closed fracture of left hip, initial encounter ([place])  Injury of head, initial encounter      NEW MEDICATIONS STARTED DURING THIS VISIT:  New Prescriptions   No medications on file     Note:  This document was prepared using Dragon voice recognition software and may include unintentional dictation errors.    Merlyn Lot, MD 09/25/17 2241

## 2017-09-25 NOTE — ED Triage Notes (Signed)
Pt to the ER for injuries sustained in a fall. Pt c/o left hip pain and pain to the back of the head. Pt fell while getting in the car.

## 2017-09-25 NOTE — ED Notes (Addendum)
Pt to the er for injuries r/t a fall. Pt has a wound to the back of her head and pain to the left hip. Family states pt was attempting to get into the car and she fell backwards. Pt has a small wound with controlled bleeding to the posterior head. Blood noted on sheet. Nothing open requiring sutures noted. Pt c/o pain to the left hip. Pt is able to roll but is painful. Some shortening and rotation noted to the left. Pt has good ROM in the right leg. Pt does have dementia.

## 2017-09-25 NOTE — ED Notes (Signed)
Family at bedside. 

## 2017-09-26 ENCOUNTER — Encounter
Admission: EM | Disposition: A | Discharge status: Skilled Nursing Facility | Payer: Self-pay | Source: Home / Self Care | Attending: Internal Medicine

## 2017-09-26 ENCOUNTER — Inpatient Hospital Stay: Payer: PPO | Admitting: Certified Registered Nurse Anesthetist

## 2017-09-26 ENCOUNTER — Inpatient Hospital Stay: Payer: PPO

## 2017-09-26 HISTORY — PX: INTRAMEDULLARY (IM) NAIL INTERTROCHANTERIC: SHX5875

## 2017-09-26 LAB — GLUCOSE, CAPILLARY
GLUCOSE-CAPILLARY: 151 mg/dL — AB (ref 65–99)
Glucose-Capillary: 100 mg/dL — ABNORMAL HIGH (ref 65–99)

## 2017-09-26 LAB — CBC
HCT: 28.7 % — ABNORMAL LOW (ref 35.0–47.0)
HEMOGLOBIN: 9.4 g/dL — AB (ref 12.0–16.0)
MCH: 28.3 pg (ref 26.0–34.0)
MCHC: 32.6 g/dL (ref 32.0–36.0)
MCV: 86.8 fL (ref 80.0–100.0)
PLATELETS: 204 10*3/uL (ref 150–440)
RBC: 3.31 MIL/uL — AB (ref 3.80–5.20)
RDW: 14.2 % (ref 11.5–14.5)
WBC: 12.2 10*3/uL — ABNORMAL HIGH (ref 3.6–11.0)

## 2017-09-26 LAB — BASIC METABOLIC PANEL
Anion gap: 11 (ref 5–15)
BUN: 17 mg/dL (ref 6–20)
CHLORIDE: 110 mmol/L (ref 101–111)
CO2: 23 mmol/L (ref 22–32)
CREATININE: 1.06 mg/dL — AB (ref 0.44–1.00)
Calcium: 8.6 mg/dL — ABNORMAL LOW (ref 8.9–10.3)
GFR calc non Af Amer: 44 mL/min — ABNORMAL LOW (ref >60)
GFR, EST AFRICAN AMERICAN: 52 mL/min — AB (ref >60)
GLUCOSE: 160 mg/dL — AB (ref 65–99)
Potassium: 3.5 mmol/L (ref 3.5–5.1)
Sodium: 144 mmol/L (ref 135–145)

## 2017-09-26 LAB — SURGICAL PCR SCREEN
MRSA, PCR: NEGATIVE
STAPHYLOCOCCUS AUREUS: POSITIVE — AB

## 2017-09-26 SURGERY — FIXATION, FRACTURE, INTERTROCHANTERIC, WITH INTRAMEDULLARY ROD
Anesthesia: Spinal | Site: Hip | Laterality: Left | Wound class: Clean

## 2017-09-26 MED ORDER — FENTANYL CITRATE (PF) 100 MCG/2ML IJ SOLN: 25.0000 ug | INTRAMUSCULAR | Status: DC | PRN

## 2017-09-26 MED ORDER — POTASSIUM CHLORIDE 10 MEQ/100ML IV SOLN
10.0000 meq | INTRAVENOUS | Status: DC | PRN
  Administered 2017-09-26 – 2017-09-28 (×2): 10 meq via INTRAVENOUS
  Filled 2017-09-26 (×5): qty 100

## 2017-09-26 MED ORDER — CEFAZOLIN SODIUM-DEXTROSE 2-4 GM/100ML-% IV SOLN
2.0000 g | INTRAVENOUS | Status: AC
  Administered 2017-09-26: 2 g via INTRAVENOUS
  Filled 2017-09-26: qty 100

## 2017-09-26 MED ORDER — MIDAZOLAM HCL 5 MG/5ML IJ SOLN
INTRAMUSCULAR | Status: DC | PRN
  Administered 2017-09-26: 0.5 mg via INTRAVENOUS

## 2017-09-26 MED ORDER — DEXTROSE 5 % IV SOLN
Freq: Four times a day (QID) | INTRAVENOUS | Status: AC
  Administered 2017-09-26 – 2017-09-27 (×3): via INTRAVENOUS
  Filled 2017-09-26 (×3): qty 10

## 2017-09-26 MED ORDER — EPHEDRINE SULFATE 50 MG/ML IJ SOLN
INTRAMUSCULAR | Status: DC | PRN
  Administered 2017-09-26: 10 mg via INTRAVENOUS

## 2017-09-26 MED ORDER — PROPOFOL 500 MG/50ML IV EMUL
INTRAVENOUS | Status: AC
  Filled 2017-09-26: qty 50

## 2017-09-26 MED ORDER — SENNA 8.6 MG PO TABS
1.0000 | ORAL_TABLET | Freq: Two times a day (BID) | ORAL | Status: DC
  Administered 2017-09-27 – 2017-09-30 (×7): 8.6 mg via ORAL
  Filled 2017-09-26 (×7): qty 1

## 2017-09-26 MED ORDER — OXYCODONE HCL 5 MG/5ML PO SOLN: 5.0000 mg | Freq: Once | ORAL | Status: DC | PRN

## 2017-09-26 MED ORDER — KETOROLAC TROMETHAMINE 15 MG/ML IJ SOLN
7.5000 mg | Freq: Four times a day (QID) | INTRAMUSCULAR | Status: AC
  Administered 2017-09-26 – 2017-09-27 (×3): 7.5 mg via INTRAVENOUS
  Filled 2017-09-26 (×3): qty 1

## 2017-09-26 MED ORDER — METOCLOPRAMIDE HCL 5 MG/ML IJ SOLN: 5.0000 mg | Freq: Three times a day (TID) | INTRAMUSCULAR | Status: DC | PRN

## 2017-09-26 MED ORDER — BUPIVACAINE HCL (PF) 0.5 % IJ SOLN
INTRAMUSCULAR | Status: DC | PRN
  Administered 2017-09-26: 3 mL

## 2017-09-26 MED ORDER — FLEET ENEMA 7-19 GM/118ML RE ENEM: 1.0000 | ENEMA | Freq: Once | RECTAL | Status: DC | PRN

## 2017-09-26 MED ORDER — PHENYLEPHRINE HCL 10 MG/ML IJ SOLN
INTRAMUSCULAR | Status: DC | PRN
  Administered 2017-09-26 (×2): 100 ug via INTRAVENOUS
  Administered 2017-09-26: 200 ug via INTRAVENOUS
  Administered 2017-09-26: 100 ug via INTRAVENOUS
  Administered 2017-09-26: 200 ug via INTRAVENOUS

## 2017-09-26 MED ORDER — ESMOLOL HCL 100 MG/10ML IV SOLN
INTRAVENOUS | Status: DC | PRN
  Administered 2017-09-26: 20 mg via INTRAVENOUS

## 2017-09-26 MED ORDER — KETAMINE HCL 50 MG/ML IJ SOLN
INTRAMUSCULAR | Status: DC | PRN
  Administered 2017-09-26 (×2): 25 mg via INTRAMUSCULAR

## 2017-09-26 MED ORDER — CEFAZOLIN SODIUM-DEXTROSE 1-4 GM/50ML-% IV SOLN
1.0000 g | Freq: Four times a day (QID) | INTRAVENOUS | Status: DC
  Filled 2017-09-26 (×3): qty 50

## 2017-09-26 MED ORDER — MUPIROCIN 2 % EX OINT
1.0000 "application " | TOPICAL_OINTMENT | Freq: Two times a day (BID) | CUTANEOUS | Status: DC
  Administered 2017-09-27 – 2017-09-30 (×8): 1 via NASAL
  Filled 2017-09-26: qty 22

## 2017-09-26 MED ORDER — MIDAZOLAM HCL 2 MG/2ML IJ SOLN
INTRAMUSCULAR | Status: AC
  Filled 2017-09-26: qty 2

## 2017-09-26 MED ORDER — METOCLOPRAMIDE HCL 10 MG PO TABS: 5.0000 mg | ORAL_TABLET | Freq: Three times a day (TID) | ORAL | Status: DC | PRN

## 2017-09-26 MED ORDER — SODIUM CHLORIDE 0.9 % IV SOLN
INTRAVENOUS | Status: DC | PRN
  Administered 2017-09-26: 50 ug/min via INTRAVENOUS

## 2017-09-26 MED ORDER — TRAMADOL HCL 50 MG PO TABS
50.0000 mg | ORAL_TABLET | Freq: Four times a day (QID) | ORAL | Status: DC | PRN
  Administered 2017-09-27 – 2017-09-29 (×3): 50 mg via ORAL
  Filled 2017-09-26 (×3): qty 1

## 2017-09-26 MED ORDER — LACTATED RINGERS IV SOLN
INTRAVENOUS | Status: DC
  Administered 2017-09-26: 23:00:00 via INTRAVENOUS

## 2017-09-26 MED ORDER — BISACODYL 10 MG RE SUPP: 10.0000 mg | Freq: Every day | RECTAL | Status: DC | PRN

## 2017-09-26 MED ORDER — CHLORHEXIDINE GLUCONATE CLOTH 2 % EX PADS
6.0000 | MEDICATED_PAD | Freq: Every day | CUTANEOUS | Status: DC
  Administered 2017-09-26 – 2017-09-30 (×4): 6 via TOPICAL

## 2017-09-26 MED ORDER — VASOPRESSIN 20 UNIT/ML IV SOLN
INTRAVENOUS | Status: DC | PRN
  Administered 2017-09-26 (×2): 1 [IU] via INTRAVENOUS

## 2017-09-26 MED ORDER — MAGNESIUM HYDROXIDE 400 MG/5ML PO SUSP: 30.0000 mL | Freq: Every day | ORAL | Status: DC | PRN

## 2017-09-26 MED ORDER — OXYCODONE HCL 5 MG PO TABS: 5.0000 mg | ORAL_TABLET | Freq: Once | ORAL | Status: DC | PRN

## 2017-09-26 MED ORDER — MORPHINE SULFATE (PF) 2 MG/ML IV SOLN
1.0000 mg | INTRAVENOUS | Status: DC | PRN
  Administered 2017-09-27: 1 mg via INTRAVENOUS
  Filled 2017-09-26: qty 1

## 2017-09-26 MED ORDER — KETAMINE HCL 50 MG/ML IJ SOLN
INTRAMUSCULAR | Status: AC
  Filled 2017-09-26: qty 10

## 2017-09-26 MED ORDER — SODIUM CHLORIDE 0.9 % IR SOLN
Status: DC | PRN
  Administered 2017-09-26: 17:00:00

## 2017-09-26 MED ORDER — ENOXAPARIN SODIUM 30 MG/0.3ML ~~LOC~~ SOLN
30.0000 mg | SUBCUTANEOUS | Status: DC
  Administered 2017-09-27: 30 mg via SUBCUTANEOUS
  Filled 2017-09-26: qty 0.3

## 2017-09-26 MED ORDER — GLYCOPYRROLATE 0.2 MG/ML IJ SOLN
INTRAMUSCULAR | Status: DC | PRN
  Administered 2017-09-26: 0.2 mg via INTRAVENOUS

## 2017-09-26 MED ORDER — PROPOFOL 500 MG/50ML IV EMUL
INTRAVENOUS | Status: DC | PRN
  Administered 2017-09-26: 25 ug/kg/min via INTRAVENOUS

## 2017-09-26 MED ORDER — ACETAMINOPHEN 500 MG PO TABS
1000.0000 mg | ORAL_TABLET | Freq: Four times a day (QID) | ORAL | Status: AC
  Administered 2017-09-26 – 2017-09-27 (×4): 1000 mg via ORAL
  Filled 2017-09-26 (×4): qty 2

## 2017-09-26 SURGICAL SUPPLY — 39 items
BASIN GRAD PLASTIC 32OZ STRL (MISCELLANEOUS) ×3 IMPLANT
BIT DRILL 4.2 (DRILL) ×1 IMPLANT
BLADE TFNA HELICAL 115 (Anchor) ×2 IMPLANT
BLADE TFNA HELICAL 115MM (Anchor) ×1 IMPLANT
BNDG COHESIVE 4X5 TAN STRL (GAUZE/BANDAGES/DRESSINGS) IMPLANT
BRUSH SCRUB EZ  4% CHG (MISCELLANEOUS) ×4
BRUSH SCRUB EZ 4% CHG (MISCELLANEOUS) ×2 IMPLANT
CANISTER SUCT 1200ML W/VALVE (MISCELLANEOUS) ×3 IMPLANT
CHLORAPREP W/TINT 26ML (MISCELLANEOUS) ×3 IMPLANT
DRAPE SHEET LG 3/4 BI-LAMINATE (DRAPES) ×3 IMPLANT
DRAPE U-SHAPE 47X51 STRL (DRAPES) ×3 IMPLANT
DRILL 4.2 (DRILL) ×3
DRSG OPSITE POSTOP 3X4 (GAUZE/BANDAGES/DRESSINGS) ×6 IMPLANT
DRSG OPSITE POSTOP 4X6 (GAUZE/BANDAGES/DRESSINGS) ×3 IMPLANT
ELECT REM PT RETURN 9FT ADLT (ELECTROSURGICAL) ×3
ELECTRODE REM PT RTRN 9FT ADLT (ELECTROSURGICAL) ×1 IMPLANT
GLOVE BIOGEL PI IND STRL 7.0 (GLOVE) ×1 IMPLANT
GLOVE BIOGEL PI INDICATOR 7.0 (GLOVE) ×2
GLOVE INDICATOR 8.0 STRL GRN (GLOVE) ×3 IMPLANT
GLOVE PROTEXIS LATEX SZ 7.5 (GLOVE) ×6 IMPLANT
GLOVE SURG ORTHO 8.0 STRL STRW (GLOVE) ×3 IMPLANT
GOWN STRL REUS W/ TWL LRG LVL3 (GOWN DISPOSABLE) ×1 IMPLANT
GOWN STRL REUS W/ TWL XL LVL3 (GOWN DISPOSABLE) ×1 IMPLANT
GOWN STRL REUS W/TWL LRG LVL3 (GOWN DISPOSABLE) ×2
GOWN STRL REUS W/TWL XL LVL3 (GOWN DISPOSABLE) ×2
GUIDEWIRE 3.2X400 (WIRE) ×6 IMPLANT
KIT TURNOVER CYSTO (KITS) ×3 IMPLANT
MAT BLUE FLOOR 46X72 FLO (MISCELLANEOUS) ×3 IMPLANT
NAIL 9MM/130 TI CANN TFNA 340 (Nail) ×3 IMPLANT
NS IRRIG 1000ML POUR BTL (IV SOLUTION) ×3 IMPLANT
PACK HIP COMPR (MISCELLANEOUS) ×3 IMPLANT
REAMER ROD DEEP FLUTE 2.5X950 (INSTRUMENTS) ×3 IMPLANT
SCREW LOCKING 5.0MMX40MM (Screw) ×3 IMPLANT
SCREW LOCKING 5.0MX50M (Screw) ×3 IMPLANT
STAPLER SKIN PROX 35W (STAPLE) ×3 IMPLANT
SUT VIC AB 0 CT1 36 (SUTURE) ×3 IMPLANT
SUT VIC AB 2-0 CT1 27 (SUTURE) ×4
SUT VIC AB 2-0 CT1 TAPERPNT 27 (SUTURE) ×2 IMPLANT
TOWEL OR 17X26 4PK STRL BLUE (TOWEL DISPOSABLE) ×3 IMPLANT

## 2017-09-26 NOTE — Anesthesia Preprocedure Evaluation (Signed)
Anesthesia Evaluation  Patient identified by MRN, date of birth, ID band Patient awake    Reviewed: Allergy & Precautions, H&P , NPO status , Patient's Chart, lab work & pertinent test results  History of Anesthesia Complications Negative for: history of anesthetic complications  Airway Mallampati: III  TM Distance: <3 FB Neck ROM: limited    Dental  (+) Chipped, Edentulous Upper, Edentulous Lower   Pulmonary neg pulmonary ROS, neg shortness of breath,           Cardiovascular hypertension, (-) angina+ Peripheral Vascular Disease  (-) Past MI and (-) DOE      Neuro/Psych PSYCHIATRIC DISORDERS Anxiety Depression negative neurological ROS     GI/Hepatic Neg liver ROS, GERD  ,  Endo/Other  Hypothyroidism   Renal/GU      Musculoskeletal   Abdominal   Peds  Hematology negative hematology ROS (+)   Anesthesia Other Findings Past Medical History: No date: Anemia 08/07/2016: Anxiety No date: Cancer [facility])     Comment:  colon ca 08/01/2016: Common bile duct (CBD) obstruction     Comment:  Overview:  Possible stone noted 07/26/16 - refer to GI.               LFTs nml but does have chronic abd pain  No date: Dementia 08/07/2016: Depression 03/02/2014: Dizzy 03/02/2014: Edema 08/01/2016: Gallstone No date: GERD (gastroesophageal reflux disease) No date: Heart disease 08/07/2016: Hyperlipidemia, unspecified 08/07/2016: Hypertension 08/07/2016: Hypothyroidism 03/02/2015: Late onset Alzheimer's disease with behavioral disturbance No date: Osteoporosis No date: Vitamin D deficiency  Past Surgical History: No date: ABDOMINAL HYSTERECTOMY No date: APPENDECTOMY 03/2002: Back sugery     Comment:  lumbar disk and fusion. Dr. Mauri Pole  No date: BREAST EXCISIONAL BIOPSY     Comment:  Benign. Dr. Pat Patrick 1971: BREAST LUMPECTOMY; N/A     Comment:  unsure No date: CATARACT EXTRACTION; Bilateral 1990s: colon cancer resection  Comment:  By Dr. Pat Patrick 08/28/2016: ERCP; N/A     Comment:  Procedure: ENDOSCOPIC RETROGRADE               CHOLANGIOPANCREATOGRAPHY (ERCP);  Surgeon: Lucilla Lame,               MD;  Location: [facility] END[facility]COPY;  Service: Endoscopy;                Laterality: N/A; 1995: HERNIA REPAIR     Comment:  ventral No date: JOINT REPLACEMENT No date: OOPHORECTOMY No date: SPINE SURGERY  BMI    Body Mass Index:  31.68 kg/m      Reproductive/Obstetrics negative OB ROS                             Anesthesia Physical Anesthesia Plan  ASA: III  Anesthesia Plan: Spinal   Post-op Pain Management:    Induction:   PONV Risk Score and Plan: Propofol infusion  Airway Management Planned: Natural Airway and Nasal Cannula  Additional Equipment:   Intra-op Plan:   Post-operative Plan:   Informed Consent: I have reviewed the patients History and Physical, chart, labs and discussed the procedure including the risks, benefits and alternatives for the proposed anesthesia with the patient or authorized representative who has indicated his/her understanding and acceptance.   Dental Advisory Given  Plan Discussed with: Anesthesiologist, CRNA and Surgeon  Anesthesia Plan Comments: (Patient reports no bleeding problems and no anticoagulant use.  Plan for spinal with backup GA  Patient consented for risks  of anesthesia including but not limited to:  - adverse reactions to medications - risk of bleeding, infection, nerve damage and headache - risk of failed spinal - damage to teeth, lips or other oral mucosa - sore throat or hoarseness - Damage to heart, brain, lungs or loss of life  Patient voiced understanding.)        Anesthesia Quick Evaluation

## 2017-09-26 NOTE — Anesthesia Procedure Notes (Signed)
Performed by: Demetrius Charity, CRNA Pre-anesthesia Checklist: Patient identified, Suction available, Emergency Drugs available, Patient being monitored and Timeout performed Oxygen Delivery Method: Simple face mask

## 2017-09-26 NOTE — Clinical Social Work Placement (Signed)
   CLINICAL SOCIAL WORK PLACEMENT  NOTE  Date:  09/26/2017  Patient Details  Name: Kerri Rangel MRN: [id number] Date of Birth: 1923-10-29  Clinical Social Work is seeking post-discharge placement for this patient at the [place] level of care (*CSW will initial, date and re-position this form in  chart as items are completed):  Yes   Patient/family provided with [place] Work Department's list of facilities offering this level of care within the geographic area requested by the patient (or if unable, by the patient's family).  Yes   Patient/family informed of their freedom to choose among providers that offer the needed level of care, that participate in Medicare, Medicaid or managed care program needed by the patient, have an available bed and are willing to accept the patient.  Yes   Patient/family informed of [place]'s ownership interest in [facility] and [facility], as well as of the fact that they are under no obligation to receive care at these facilities.  PASRR submitted to EDS on 09/26/17     PASRR number received on 09/26/17     Existing PASRR number confirmed on       FL2 transmitted to all facilities in geographic area requested by pt/family on 09/26/17     FL2 transmitted to all facilities within larger geographic area on       Patient informed that his/her managed care company has contracts with or will negotiate with certain facilities, including the following:            Patient/family informed of bed offers received.  Patient chooses bed at       Physician recommends and patient chooses bed at      Patient to be transferred to   on  .  Patient to be transferred to facility by       Patient family notified on   of transfer.  Name of family member notified:        PHYSICIAN       Additional Comment:    _______________________________________________ Glorene Leitzke, Veronia Beets, LCSW 09/26/2017, 10:15 AM

## 2017-09-26 NOTE — H&P (Signed)
The patient has been re-examined, and the chart reviewed, and there have been no interval changes to the documented history and physical.  Plan a left hip trochanteric femoral nail today.  Anesthesia is not consulted regarding a peripheral nerve block for post-operative pain.  The risks, benefits, and alternatives have been discussed at length, and the patient is willing to proceed.     

## 2017-09-26 NOTE — Transfer of Care (Signed)
Immediate Anesthesia Transfer of Care Note  Patient: Kerri Rangel  Procedure(s) Performed: INTRAMEDULLARY (IM) NAIL INTERTROCHANTRIC (Left Hip)  Patient Location: PACU  Anesthesia Type:Spinal  Level of Consciousness: awake, alert  and oriented  Airway & Oxygen Therapy: Patient Spontanous Breathing and Patient connected to face mask oxygen  Post-op Assessment: Report given to RN and Post -op Vital signs reviewed and stable  Post vital signs: Reviewed and stable  Last Vitals:  Vitals:   09/26/17 0353 09/26/17 0951  BP: (!) 131/52 (!) 105/33  Pulse: 74 68  Resp: 18 20  Temp: 37.2 C 37.2 C  SpO2: 95% 94%    Last Pain:  Vitals:   09/26/17 0951  TempSrc: Oral  PainSc: 1          Complications: No apparent anesthesia complications

## 2017-09-26 NOTE — Progress Notes (Signed)
Pt admitted with left hip fracture. Has hearing aide in left ear. Pt is alert to self only. External catheter in place. Daughter called for admitting questions.

## 2017-09-26 NOTE — Clinical Social Work Note (Addendum)
Clinical Social Work Assessment  Patient Details  Name: Kerri Rangel MRN: [id number] Date of Birth: 1924/06/15  Date of referral:  09/26/17               Reason for consult:  Facility Placement, Discharge Planning                Permission sought to share information with:  Chartered certified accountant granted to share information::  Yes, Verbal Permission Granted  Name::      [place]::   [place]  Relationship::     Contact Information:     Housing/Transportation Living arrangements for the past 2 months:  [place] of Information:  Adult Children Patient Interpreter Needed:  None Criminal Activity/Legal Involvement Pertinent to Current Situation/Hospitalization:  No - Comment as needed Significant Relationships:  Adult Children, Other Family Members Lives with:  Adult Children Do you feel safe going back to the place where you live?  Yes Need for family participation in patient care:  Yes (Comment)  Care giving concerns: Patient lives in [place] with daughter/HPOA Kerri Rangel [phone]).   Social Worker assessment / plan: [place] representative (CSW) reviewed patient's chart and noted that patient has a hip fracture. Patient has an ortho consult and PT is pending. Social work Theatre manager met with patient, daughter Kerri Rangel, son Kerri Rangel [phone]), grandchildren Kerri Rangel and Kerri Rangel, and daughter-in-law Kerri Rangel at bedside. Patient was laying in bed alert and oriented only to self. Social work Theatre manager introduced self and explained the role of the [place]. Patient's children participated in assessment. Per Kerri Rangel patient lives with her in [place] and brother Kerri Rangel also lives close by and is also a primary contact in case she is unavailable. Social work Theatre manager explained that patient will meet with an orthopedic physician regarding treatment plan and that PT is pending. Social work Theatre manager also explained  that PT might recommend SNF, the process, and that [place] will have to approve. Patient family member verbalized their understanding and are agreeable to SNF search in [place]. FL2 completed and faxed out. CSW and Social work Theatre manager will continue to follow up and assist.  Health Team case manager is aware of above.   Employment status:  Retired Forensic scientist:  Managed Care PT Recommendations:  Not assessed at this time Information / Referral to community resources:     Patient/Family's Response to care:  Patient family members are agreeable to SNF search.  Patient/Family's Understanding of and Emotional Response to Diagnosis, Current Treatment, and Prognosis: Patient and her family members were pleasant and thanked social work Theatre manager for her assistance.  Emotional Assessment Appearance:  Appears stated age Attitude/Demeanor/Rapport:    Affect (typically observed):  Quiet, Pleasant, Calm Orientation:  Oriented to Self Alcohol / Substance use:  Not Applicable Psych involvement (Current and /or in the community):  No (Comment)  Discharge Needs  Concerns to be addressed:  Care Coordination, Discharge Planning Concerns Readmission within the last 30 days:  No Current discharge risk:  Dependent with Mobility Barriers to Discharge:  Continued Medical Work up   Smith Mince, Student-Social Work 09/26/2017, 9:51 AM

## 2017-09-26 NOTE — NC FL2 (Signed)
[place] LEVEL OF CARE SCREENING TOOL     IDENTIFICATION  Patient Name: Kerri Rangel Birthdate: Nov 21, 1923 Sex: female Admission Date (Current Location): 09/25/2017  [place] and [place] Number:  Engineering geologist and Address:  [facility], [address] [place], [place] [address]      Provider Number: [id number]  Attending Physician Name and Address:  Nicholes Mango, MD  Relative Name and Phone Number:       Current Level of Care: Hospital Recommended Level of Care: [place] Prior Approval Number:    Date Approved/Denied:   PASRR Number: [id number])  Discharge Plan: SNF    Current Diagnoses: Patient Active Problem List   Diagnosis Date Noted  . Closed left hip fracture ([place]) 09/25/2017  . Carotid artery disease ([place]) 11/06/2016  . Anxiety 08/07/2016  . Depression 08/07/2016  . Hyperlipidemia, unspecified 08/07/2016  . Hypertension 08/07/2016  . Hypothyroidism 08/07/2016  . Vitamin D deficiency   . Dementia   . Common bile duct (CBD) obstruction 08/01/2016  . Gallstone 08/01/2016  . Late onset Alzheimer's disease with behavioral disturbance 03/02/2015  . Dizzy 03/02/2014  . Edema 03/02/2014    Orientation RESPIRATION BLADDER Height & Weight     Self  Normal Continent Weight: 176 lb (79.8 kg) Height:  5' 2.5" (158.8 cm)  BEHAVIORAL SYMPTOMS/MOOD NEUROLOGICAL BOWEL NUTRITION STATUS      Continent Diet(Heart Healthy)  AMBULATORY STATUS COMMUNICATION OF NEEDS Skin   Extensive Assist Verbally Bruising(Right Anterior of Face)                       Personal Care Assistance Level of Assistance  Bathing, Feeding, Dressing Bathing Assistance: Limited assistance Feeding assistance: Independent Dressing Assistance: Limited assistance     Functional Limitations Info  Sight, Hearing, Speech Sight Info: Impaired Hearing Info: Impaired Speech Info: Adequate    SPECIAL CARE FACTORS FREQUENCY   PT (By licensed PT), OT (By licensed OT)     PT Frequency: (5) OT Frequency: (5)            Contractures      Additional Factors Info  Code Status, Allergies Code Status Info: (Full Code) Allergies Info: (No Known Allergies)           Current Medications (09/26/2017):  This is the current hospital active medication list Current Facility-Administered Medications  Medication Dose Route Frequency Provider Last Rate Last Dose  . 0.9 %  sodium chloride infusion   Intravenous Continuous Amelia Jo, MD 75 mL/hr at 09/26/17 0004    . acetaminophen (TYLENOL) tablet 650 mg  650 mg Oral Q6H PRN Amelia Jo, MD       Or  . acetaminophen (TYLENOL) suppository 650 mg  650 mg Rectal Q6H PRN Amelia Jo, MD      . atenolol (TENORMIN) tablet 50 mg  50 mg Oral Daily Amelia Jo, MD      . bisacodyl (DULCOLAX) EC tablet 5 mg  5 mg Oral Daily PRN Amelia Jo, MD      . docusate sodium (COLACE) capsule 100 mg  100 mg Oral BID Amelia Jo, MD   100 mg at 09/26/17 0054  . donepezil (ARICEPT) tablet 5 mg  5 mg Oral Q1200 Amelia Jo, MD      . fentaNYL ([place] - dosed mcg/hr) 12.5 mcg  12.5 mcg Transdermal Q72H Amelia Jo, MD   12.5 mcg at 09/26/17 0053  . fentaNYL (SUBLIMAZE) injection 50 mcg  50  mcg Intravenous Q1H PRN Merlyn Lot, MD   50 mcg at 09/25/17 2326  . HYDROcodone-acetaminophen (NORCO/VICODIN) 5-325 MG per tablet 1-2 tablet  1-2 tablet Oral Q4H PRN Amelia Jo, MD      . levothyroxine (SYNTHROID, LEVOTHROID) tablet 25 mcg  25 mcg Oral QAC breakfast Amelia Jo, MD      . Melatonin TABS 5 mg  5 mg Oral QHS PRN Amelia Jo, MD      . mirtazapine (REMERON) tablet 30 mg  30 mg Oral QHS Amelia Jo, MD   30 mg at 09/26/17 0054  . ondansetron (ZOFRAN) tablet 4 mg  4 mg Oral Q6H PRN Amelia Jo, MD       Or  . ondansetron [facility]) injection 4 mg  4 mg Intravenous Q6H PRN Amelia Jo, MD      . pantoprazole (PROTONIX) EC tablet 40 mg  40 mg Oral Daily  Amelia Jo, MD      . potassium chloride 10 mEq in 100 mL IVPB  10 mEq Intravenous Q1H PRN Amelia Jo, MD      . sertraline (ZOLOFT) tablet 25 mg  25 mg Oral Daily Amelia Jo, MD      . traZODone (DESYREL) tablet 25 mg  25 mg Oral QHS PRN Amelia Jo, MD   25 mg at 09/26/17 0102  . vitamin B-12 (CYANOCOBALAMIN) tablet 1,000 mcg  1,000 mcg Oral Daily Amelia Jo, MD         Discharge Medications: Please see discharge summary for a list of discharge medications.  Relevant Imaging Results:  Relevant Lab Results:   Additional Information (SSN: [id number])  Smith Mince, Student-Social Work

## 2017-09-26 NOTE — Progress Notes (Signed)
Gold colored ring sent home with son. One hearing aide sent home with son. Top and bottom dentures in room in cup. Report given to OR RN. Family updated.

## 2017-09-26 NOTE — Anesthesia Procedure Notes (Signed)
Spinal  Patient location during procedure: OR Start time: 09/26/2017 4:13 PM End time: 09/26/2017 4:23 PM Staffing Anesthesiologist: Corley Maffeo, Precious Haws, MD Resident/CRNA: Demetrius Charity, CRNA Performed: anesthesiologist and resident/CRNA  Preanesthetic Checklist Completed: patient identified, site marked, surgical consent, pre-op evaluation, timeout performed, IV checked, risks and benefits discussed and monitors and equipment checked Spinal Block Patient position: sitting Prep: ChloraPrep Patient monitoring: heart rate, continuous pulse ox, blood pressure and cardiac monitor Approach: midline Location: L3-4 Injection technique: single-shot Needle Needle type: [place] and Introducer  Needle gauge: 24 G Needle length: 9 cm Assessment Sensory level: T10 Additional Notes Poor CSF flow through spinal needle Negative paresthesia. Negative blood return. Positive free-flowing CSF. Expiration date of kit checked and confirmed. Patient tolerated procedure well, without complications.

## 2017-09-26 NOTE — Progress Notes (Signed)
[place] [facility] [place] NAME: Kerri Rangel    MR#:  [id number]  DATE OF BIRTH:  [birth year]  SUBJECTIVE:  CHIEF COMPLAINT:  Left hip pain ok, family bed side  REVIEW OF SYSTEMS:  CONSTITUTIONAL: No fever, fatigue or weakness.  EYES: No blurred or double vision.  EARS, NOSE, AND THROAT: No tinnitus or ear pain.  RESPIRATORY: No cough, shortness of breath, wheezing or hemoptysis.  CARDIOVASCULAR: No chest pain, orthopnea, edema.  GASTROINTESTINAL: No nausea, vomiting, diarrhea or abdominal pain.  GENITOURINARY: No dysuria, hematuria.  ENDOCRINE: No polyuria, nocturia,  HEMATOLOGY: No anemia, easy bruising or bleeding SKIN: No rash or lesion. MUSCULOSKELETAL:left hip pain NEUROLOGIC: No tingling, numbness, weakness.  PSYCHIATRY: No anxiety or depression.   DRUG ALLERGIES:  No Known Allergies  VITALS:  Blood pressure (!) 129/39, pulse 76, temperature 98.1 F (36.7 C), temperature source Oral, resp. rate 19, height 5' 2.5" (1.588 m), weight 79.8 kg (176 lb), SpO2 98 %.  PHYSICAL EXAMINATION:  GENERAL:  84 y.o.-year-old patient lying in the bed with no acute distress.  EYES: Pupils equal, round, reactive to light and accommodation. No scleral icterus. Extraocular muscles intact.  HEENT: Head atraumatic, normocephalic. Oropharynx and nasopharynx clear.  NECK:  Supple, no jugular venous distention. No thyroid enlargement, no tenderness.  LUNGS: Normal breath sounds bilaterally, no wheezing, rales,rhonchi or crepitation. No use of accessory muscles of respiration.  CARDIOVASCULAR: S1, S2 normal. No murmurs, rubs, or gallops.  ABDOMEN: Soft, nontender, nondistended. Bowel sounds present. No organomegaly or mass.  EXTREMITIES: left hip is tender No pedal edema, cyanosis, or clubbing.  NEUROLOGIC: Cranial nerves II through XII are intact. . Sensation intact. Gait not checked.  PSYCHIATRIC: The patient is alert and oriented x 3.  SKIN: No  obvious rash, lesion, or ulcer.    LABORATORY PANEL:   CBC Recent Labs  Lab 09/26/17 0340  WBC 12.2*  HGB 9.4*  HCT 28.7*  PLT 204   ------------------------------------------------------------------------------------------------------------------  Chemistries  Recent Labs  Lab 09/25/17 1956 09/26/17 0340  NA 146* 144  K 3.4* 3.5  CL 110 110  CO2 23 23  GLUCOSE 196* 160*  BUN 14 17  CREATININE 1.03* 1.06*  CALCIUM 8.9 8.6*  AST 30  --   ALT 11*  --   ALKPHOS 103  --   BILITOT 0.7  --    ------------------------------------------------------------------------------------------------------------------  Cardiac Enzymes No results for input(s): TROPONINI in the last 168 hours. ------------------------------------------------------------------------------------------------------------------  RADIOLOGY:  Ct Head Wo Contrast  Result Date: 09/25/2017 CLINICAL DATA:  Posterior headache following a fall getting into a car. EXAM: CT HEAD WITHOUT CONTRAST TECHNIQUE: Contiguous axial images were obtained from the base of the skull through the vertex without intravenous contrast. COMPARISON:  03/08/2015. FINDINGS: Brain: Diffusely enlarged ventricles and subarachnoid spaces. Patchy white matter low density in both cerebral hemispheres. 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 Interval decrease in size of an old left internal capsule infarct at the juncture with the thalamus. No intracranial hemorrhage, mass lesion or CT evidence of acute infarction. Vascular: No hyperdense vessel or unexpected calcification. Skull: Bilateral hyperostosis frontalis.  No skull fracture. Sinuses/Orbits: Underdeveloped right mastoid air cells without significant change. Status post bilateral cataract removal. Other: Scalp hematoma at the skull vertex. Small amount of associated soft tissue air on the left, compatible with a small laceration. IMPRESSION: 1. Scalp hematoma and laceration at the skull vertex  without skull fracture or intracranial hemorrhage. 2. Atrophy, chronic small vessel white  matter ischemic changes and old infarcts, as described above. Electronically Signed   By: Claudie Revering M.D.   On: 09/25/2017 19:57   Dg Chest Portable 1 View  Result Date: 09/25/2017 CLINICAL DATA:  Recent fall with left hip pain EXAM: PORTABLE CHEST 1 VIEW COMPARISON:  02/24/2013 FINDINGS: Cardiac shadow is stable. Aortic calcifications are again seen. Elevation right hemidiaphragm is noted. No focal infiltrate or sizable effusion is seen. No definitive bony abnormality is noted. IMPRESSION: No active disease. Electronically Signed   By: Inez Catalina M.D.   On: 09/25/2017 21:08   Dg Hip Operative Unilat W Or W/o Pelvis Left  Result Date: 09/26/2017 CLINICAL DATA:  Surgery EXAM: OPERATIVE RIGHT HIP (WITH PELVIS IF PERFORMED) 21 VIEWS TECHNIQUE: Fluoroscopic spot image(s) were submitted for interpretation post-operatively. COMPARISON:  Plain film of the left hip performed on 09/25/2017. FINDINGS: Intraoperative fluoroscopic spot images show placement of an intramedullary rod within the proximal right femur and dynamic compression screw traversing the right femoral neck. Hardware appears intact and appropriately positioned. Distal locking screws appear intact and appropriately positioned. Fluoroscopy was provided for 1 minute 17 seconds. IMPRESSION: Intraoperative fluoroscopic spot images showing placement of intramedullary rod and dynamic compression screw traversing the right femoral neck. No evidence of surgical complicating feature. Electronically Signed   By: Franki Cabot M.D.   On: 09/26/2017 19:31   Dg Hip Unilat W Or Wo Pelvis 2-3 Views Left  Result Date: 09/25/2017 CLINICAL DATA:  Left hip pain following a fall. EXAM: DG HIP (WITH OR WITHOUT PELVIS) 2-3V LEFT COMPARISON:  None. FINDINGS: Proximal left femoral subtrochanteric fracture with 1 shaft width of medial displacement and mild posterior displacement  of the distal fragment. There is also mild posterior angulation of the distal fragment. Diffuse osteopenia. Multiple pelvic surgical clips. Lower lumbar spine degenerative changes. IMPRESSION: Significantly displaced left subtrochanteric fracture. Electronically Signed   By: Claudie Revering M.D.   On: 09/25/2017 21:18    EKG:   Orders placed or performed during the hospital encounter of 09/25/17  . ED EKG  . ED EKG    ASSESSMENT AND PLAN:   .1 Displaced left subtrochanteric fracture, status post mechanical fall.   We will continue pain control prn  Orthopedics consulted s/p intertrochantericintramed nail POD # 0 2.  Posterior scalp laceration, status post fall, continue wound care.  3.  Advanced dementia, we will restart home medications.  Monitor clinically closely.  4.  Hypokalemia, will replace K.        All the records are reviewed and case discussed with Care Management/Social Workerr. Management plans discussed with the patient, family and they are in agreement.  CODE STATUS: fc   TOTAL TIME TAKING CARE OF THIS PATIENT:  35  minutes.   POSSIBLE D/C IN 2  DAYS, DEPENDING ON CLINICAL CONDITION.  Note: This dictation was prepared with Dragon dictation along with smaller phrase technology. Any transcriptional errors that result from this process are unintentional.   Nicholes Mango M.D on 09/26/2017 at 10:40 PM  Between 7am to 6pm - Pager - [phone] After 6pm go to [email or web address] - password [email or web address] [facility]  [place] [facility]  Office  [phone]  CC: Primary care physician; Leonel Ramsay, MD

## 2017-09-26 NOTE — H&P (Addendum)
[facility] NAME: Kerri Rangel    MR#:  [id number]  DATE OF BIRTH:  07/11/1924  DATE OF ADMISSION:  09/25/2017   This patient was seen and evaluated by myself on 09/25/2017.  PRIMARY CARE PHYSICIAN: Kerri Ramsay, MD   REQUESTING/REFERRING PHYSICIAN:   CHIEF COMPLAINT:   Chief Complaint  Patient presents with  . Fall    HISTORY OF PRESENT ILLNESS: Kerri Rangel  is a 84 y.o. female with a known history of advanced dementia, hypertension and hypothyroidism.  Patient is a poor historian due to her dementia.  Most of the information was obtained from reviewing the medical records and from discussion with the family.  Patient was admitted to emergency room after a mechanical fall.  She was going out to [employer] for dinner with her family and she lost her balance outside on the pavement in front of the restaurant.  She fell on her left side and hurt her head and her left hip.  Per family, patient has been slowly deconditioning in the past 6-12 months.  She spends most of her time laying in the bed and when she walks her balance is poor.  She uses a walker at home. Upon evaluation in the emergency room, left hip x-ray showed displaced left subtrochanteric fracture.  CAT scan of the brain showed scalp hematoma, but no intracranial hemorrhage or bone fracture.  Patient is admitted for further evaluation and treatment.   PAST MEDICAL HISTORY:   Past Medical History:  Diagnosis Date  . Anemia   . Anxiety 08/07/2016  . Cancer ([place])    colon ca  . Common bile duct (CBD) obstruction 08/01/2016   Overview:  Possible stone noted 07/26/16 - refer to GI. LFTs nml but does have chronic abd pain   . Dementia   . Depression 08/07/2016  . Dizzy 03/02/2014  . Edema 03/02/2014  . Gallstone 08/01/2016  . GERD (gastroesophageal reflux disease)   . Heart disease   . Hyperlipidemia, unspecified 08/07/2016  . Hypertension 08/07/2016   . Hypothyroidism 08/07/2016  . Late onset Alzheimer's disease with behavioral disturbance 03/02/2015  . Osteoporosis   . Vitamin D deficiency     PAST SURGICAL HISTORY:  Past Surgical History:  Procedure Laterality Date  . ABDOMINAL HYSTERECTOMY    . APPENDECTOMY    . Back sugery  03/2002   lumbar disk and fusion. Dr. Mauri Pole   . BREAST EXCISIONAL BIOPSY     Benign. Dr. Pat Patrick  . BREAST LUMPECTOMY N/A 1971   unsure  . CATARACT EXTRACTION Bilateral   . colon cancer resection  1990s   By Dr. Pat Patrick  . ERCP N/A 08/28/2016   Procedure: ENDOSCOPIC RETROGRADE CHOLANGIOPANCREATOGRAPHY (ERCP);  Surgeon: Lucilla Lame, MD;  Location: [facility] ENDOSCOPY;  Service: Endoscopy;  Laterality: N/A;  . HERNIA REPAIR  1995   ventral  . JOINT REPLACEMENT    . OOPHORECTOMY    . SPINE SURGERY      SOCIAL HISTORY:  Social History   Tobacco Use  . Smoking status: Never Smoker  . Smokeless tobacco: Never Used  Substance Use Topics  . Alcohol use: No    FAMILY HISTORY:  Family History  Problem Relation Age of Onset  . Diabetes Mother   . Heart attack Mother   . Cancer Sister   . Diabetes Sister   . Alcohol abuse Brother   . Diabetes Brother   . Heart attack Brother   .  Stroke Brother     DRUG ALLERGIES: No Known Allergies  REVIEW OF SYSTEMS:   CONSTITUTIONAL: No fever, fatigue or weakness.  EYES: No blurred or double vision.  EARS, NOSE, AND THROAT: No tinnitus or ear pain.  RESPIRATORY: No cough, shortness of breath, wheezing or hemoptysis.  CARDIOVASCULAR: No chest pain, orthopnea, edema.  GASTROINTESTINAL: No nausea, vomiting, diarrhea or abdominal pain.  GENITOURINARY: No dysuria, hematuria.  ENDOCRINE: No polyuria, nocturia,  HEMATOLOGY: Positive for bleeding, at the posterior scalp area, from the laceration during the fall.  SKIN: No rash.  Positive for posterior scalp area laceration, status post fall MUSCULOSKELETAL: Positive for left hip pain status post left hip fracture.    NEUROLOGIC: No focal weakness.  PSYCHIATRY: No anxiety or depression.   MEDICATIONS AT HOME:  Prior to Admission medications   Medication Sig Start Date End Date Taking? Authorizing Provider  atenolol (TENORMIN) 50 MG tablet Take 50 mg by mouth daily.    Yes [provider]  Cyanocobalamin (RA VITAMIN B-12 TR) 1000 MCG TBCR Take 1,000 mcg by mouth daily.    Yes [provider]  donepezil (ARICEPT) 5 MG tablet Take 5 mg by mouth daily at 12 noon.    Yes [provider]  fentaNYL (DURAGESIC - DOSED MCG/HR) 12 MCG/HR Place 12.5 mcg onto the skin.  01/15/14  Yes [provider]  HYDROcodone-acetaminophen (NORCO) 10-325 MG tablet Take 1 tablet by mouth daily as needed for moderate pain.    Yes [provider]  levothyroxine (SYNTHROID, LEVOTHROID) 25 MCG tablet Take 25 mcg by mouth daily before breakfast.  04/27/16  Yes [provider]  Melatonin 5 MG TABS Take 5 mg by mouth at bedtime as needed (sleep).   Yes [provider]  mirtazapine (REMERON) 30 MG tablet Take 30 mg by mouth at bedtime.    Yes [provider]  pantoprazole (PROTONIX) 40 MG tablet Take 40 mg by mouth daily.   Yes [provider]  sertraline (ZOLOFT) 25 MG tablet Take 25 mg by mouth daily.   Yes [provider]      PHYSICAL EXAMINATION:   VITAL SIGNS: Blood pressure (!) 168/50, pulse 65, temperature 99.3 F (37.4 C), temperature source Oral, resp. rate 19, height 5' 2.5" (1.588 m), weight 75.3 kg (166 lb), SpO2 98 %.  GENERAL:  84 y.o.-year-old patient lying in the bed, in moderate distress, secondary to left hip pain.  EYES: Pupils equal, round, reactive to light and accommodation. No scleral icterus. Extraocular muscles intact.  HEENT: Head atraumatic, normocephalic. Oropharynx and nasopharynx clear.  NECK:  Supple, no jugular venous distention. No thyroid enlargement, no tenderness.  LUNGS: Normal breath sounds bilaterally, no  wheezing, rales,rhonchi or crepitation. No use of accessory muscles of respiration.  CARDIOVASCULAR: S1, S2 normal. No S3/S4.  ABDOMEN: Soft, nontender, nondistended. Bowel sounds present. No organomegaly or mass.  EXTREMITIES: Left leg is shortened and externally rotated.  Range of motion is severely reduced at the left hip joint, due to severe pain. NEUROLOGIC: No focal weakness. Gait not checked, as patient is not able to ambulate.  PSYCHIATRIC: The patient is alert and oriented x 2.  SKIN: No obvious rash, lesion, or ulcer.   LABORATORY PANEL:   CBC Recent Labs  Lab 09/25/17 1956  WBC 8.9  HGB 12.1  HCT 36.8  PLT 255  MCV 87.0  MCH 28.7  MCHC 33.0  RDW 14.2  LYMPHSABS 1.3  MONOABS 0.5  EOSABS 0.1  BASOSABS 0.1   ------------------------------------------------------------------------------------------------------------------  Chemistries  Recent Labs  Lab 09/25/17 1956  NA 146*  K 3.4*  CL 110  CO2 23  GLUCOSE 196*  BUN 14  CREATININE 1.03*  CALCIUM 8.9  AST 30  ALT 11*  ALKPHOS 103  BILITOT 0.7   ------------------------------------------------------------------------------------------------------------------ estimated creatinine clearance is 34.2 mL/min (A) (by C-G formula based on SCr of 1.03 mg/dL (H)). ------------------------------------------------------------------------------------------------------------------ No results for input(s): TSH, T4TOTAL, T3FREE, THYROIDAB in the last 72 hours.  Invalid input(s): FREET3   Coagulation profile No results for input(s): INR, PROTIME in the last 168 hours. ------------------------------------------------------------------------------------------------------------------- No results for input(s): DDIMER in the last 72 hours. -------------------------------------------------------------------------------------------------------------------  Cardiac Enzymes No results for input(s): CKMB, TROPONINI, MYOGLOBIN  in the last 168 hours.  Invalid input(s): CK ------------------------------------------------------------------------------------------------------------------ Invalid input(s): POCBNP  ---------------------------------------------------------------------------------------------------------------  Urinalysis    Component Value Date/Time   COLORURINE Yellow 12/23/2014 1349   APPEARANCEUR Clear 12/23/2014 1349   LABSPEC 1.009 12/23/2014 1349   PHURINE 6.0 12/23/2014 1349   GLUCOSEU Negative 12/23/2014 1349   HGBUR 1+ 12/23/2014 1349   BILIRUBINUR Negative 12/23/2014 1349   KETONESUR Negative 12/23/2014 1349   PROTEINUR Negative 12/23/2014 1349   NITRITE Positive 12/23/2014 1349   LEUKOCYTESUR Trace 12/23/2014 1349     RADIOLOGY: Ct Head Wo Contrast  Result Date: 09/25/2017 CLINICAL DATA:  Posterior headache following a fall getting into a car. EXAM: CT HEAD WITHOUT CONTRAST TECHNIQUE: Contiguous axial images were obtained from the base of the skull through the vertex without intravenous contrast. COMPARISON:  03/08/2015. FINDINGS: Brain: Diffusely enlarged ventricles and subarachnoid spaces. Patchy white matter low density in both cerebral hemispheres. 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 Interval decrease in size of an old left internal capsule infarct at the juncture with the thalamus. No intracranial hemorrhage, mass lesion or CT evidence of acute infarction. Vascular: No hyperdense vessel or unexpected calcification. Skull: Bilateral hyperostosis frontalis.  No skull fracture. Sinuses/Orbits: Underdeveloped right mastoid air cells without significant change. Status post bilateral cataract removal. Other: Scalp hematoma at the skull vertex. Small amount of associated soft tissue air on the left, compatible with a small laceration. IMPRESSION: 1. Scalp hematoma and laceration at the skull vertex without skull fracture or intracranial hemorrhage. 2. Atrophy, chronic small vessel  white matter ischemic changes and old infarcts, as described above. Electronically Signed   By: Claudie Revering M.D.   On: 09/25/2017 19:57   Dg Chest Portable 1 View  Result Date: 09/25/2017 CLINICAL DATA:  Recent fall with left hip pain EXAM: PORTABLE CHEST 1 VIEW COMPARISON:  02/24/2013 FINDINGS: Cardiac shadow is stable. Aortic calcifications are again seen. Elevation right hemidiaphragm is noted. No focal infiltrate or sizable effusion is seen. No definitive bony abnormality is noted. IMPRESSION: No active disease. Electronically Signed   By: Inez Catalina M.D.   On: 09/25/2017 21:08   Dg Hip Unilat W Or Wo Pelvis 2-3 Views Left  Result Date: 09/25/2017 CLINICAL DATA:  Left hip pain following a fall. EXAM: DG HIP (WITH OR WITHOUT PELVIS) 2-3V LEFT COMPARISON:  None. FINDINGS: Proximal left femoral subtrochanteric fracture with 1 shaft width of medial displacement and mild posterior displacement of the distal fragment. There is also mild posterior angulation of the distal fragment. Diffuse osteopenia. Multiple pelvic surgical clips. Lower lumbar spine degenerative changes. IMPRESSION: Significantly displaced left subtrochanteric fracture. Electronically Signed   By: Claudie Revering M.D.   On: 09/25/2017 21:18    EKG: Orders placed or performed during the hospital encounter of 09/25/17  . ED EKG  .  ED EKG    IMPRESSION AND PLAN:  1. Displaced left subtrochanteric fracture, status post mechanical fall.  We will continue pain control.  Orthopedics, consulted, for possible surgical repair in a.m. 2.  Posterior scalp laceration, status post fall, continue wound care. 3.  Advanced dementia, we will restart home medications.  Monitor clinically closely. 4.  Hypokalemia, will replace K.   All the records are reviewed and case discussed with ED provider. Management plans discussed with the patient, family and they are in agreement.  CODE STATUS:    Code Status Orders  (From admission, onward)         Start     Ordered   09/25/17 2318  Full code  Continuous     09/25/17 2317    Code Status History    Date Active Date Inactive Code Status Order ID Comments User Context   This patient has a current code status but no historical code status.    Advance Directive Documentation     Most Recent Value  Type of Advance Directive  Healthcare Power of Attorney, Living will  Pre-existing out of facility DNR order (yellow form or pink MOST form)  No data  "MOST" Form in Place?  No data       TOTAL TIME TAKING CARE OF THIS PATIENT: 40 minutes.    Amelia Jo M.D on 09/26/2017 at 2:57 AM  Between 7am to 6pm - Pager - [phone]  After 6pm go to [email or web address] - password [email or web address] [facility]  [facility]  Office  [phone]  CC: Primary care physician; Kerri Ramsay, MD

## 2017-09-26 NOTE — Op Note (Signed)
DATE OF SURGERY:  09/26/2017  TIME: 6:07 PM  PATIENT NAME:  Kerri Rangel  AGE: 84 y.o.  PRE-OPERATIVE DIAGNOSIS:  left hip fracture  POST-OPERATIVE DIAGNOSIS:  SAME  PROCEDURE:  INTRAMEDULLARY (IM) NAIL INTERTROCHANTRIC, LEFT  SURGEON:  Lovell Sheehan  EBL:  323 cc  COMPLICATIONS:  None apparent  OPERATIVE IMPLANTS: Synthes trochanteric femoral nail  9 mm x 340 mm  with interlocking helical blade  557 mm  PREOPERATIVE INDICATIONS:  Kerri Rangel is a 84 y.o. year old who fell and suffered a hip fracture. She was brought into the ER and then admitted and optimized and then elected for surgical intervention.    The risks benefits and alternatives were discussed with the patient including but not limited to the risks of nonoperative treatment, versus surgical intervention including infection, bleeding, nerve injury, malunion, nonunion, hardware prominence, hardware failure, need for hardware removal, blood clots, cardiopulmonary complications, morbidity, mortality, among others, and they were willing to proceed.    OPERATIVE PROCEDURE:  The patient was brought to the operating room and placed in the supine position.  Spinal anesthesia was administered. She was placed on the fracture table.  Closed reduction was performed under C-arm guidance. The length of the femur was also measured using fluoroscopy. Time out was then performed after sterile prep and drape. She received preoperative antibiotics.  Incision was made proximal to the greater trochanter. A guidewire was placed in the appropriate position. Confirmation was made on AP and lateral views. The above-named nail was opened. I opened the proximal femur with a reamer. I then placed the nail by hand easily down. I did not need to ream the femur.  Once the nail was completely seated, I placed a guidepin into the femoral head into the center center position through a second incision.  I measured the length, and then reamed the  lateral cortex and up into the head. I then placed the helical blade. Slight compression was applied. Anatomic fixation achieved. Bone quality was poor. The fracture was reverse obliquity with comminution and quite unstable.  I then secured the proximal interlock.  Next, using perfect circles technique two distal interlocking screws were placed with average bone purchase. I then removed the instruments, and took final C-arm pictures AP and lateral the entire length of the leg. Anatomic reconstruction was achieved, and the wounds were irrigated copiously and closed with Vicryl  followed by staples and dry sterile dressing. Sponge and needle count were correct.   The patient was awakened and returned to PACU in stable and satisfactory condition. There no complications and the patient tolerated the procedure well.  She will be weightbearing as tolerated.    Lovell Sheehan

## 2017-09-26 NOTE — Anesthesia Post-op Follow-up Note (Signed)
Anesthesia QCDR form completed.        

## 2017-09-27 ENCOUNTER — Other Ambulatory Visit: Payer: Self-pay

## 2017-09-27 LAB — COMPREHENSIVE METABOLIC PANEL
ALBUMIN: 2.4 g/dL — AB (ref 3.5–5.0)
ALK PHOS: 67 U/L (ref 38–126)
ALT: 10 U/L — ABNORMAL LOW (ref 14–54)
ANION GAP: 10 (ref 5–15)
AST: 28 U/L (ref 15–41)
BILIRUBIN TOTAL: 0.8 mg/dL (ref 0.3–1.2)
BUN: 19 mg/dL (ref 6–20)
CALCIUM: 8.2 mg/dL — AB (ref 8.9–10.3)
CO2: 24 mmol/L (ref 22–32)
Chloride: 112 mmol/L — ABNORMAL HIGH (ref 101–111)
Creatinine, Ser: 1.06 mg/dL — ABNORMAL HIGH (ref 0.44–1.00)
GFR calc non Af Amer: 44 mL/min — ABNORMAL LOW (ref >60)
GFR, EST AFRICAN AMERICAN: 52 mL/min — AB (ref >60)
Glucose, Bld: 119 mg/dL — ABNORMAL HIGH (ref 65–99)
POTASSIUM: 3.9 mmol/L (ref 3.5–5.1)
Sodium: 146 mmol/L — ABNORMAL HIGH (ref 135–145)
TOTAL PROTEIN: 4.7 g/dL — AB (ref 6.5–8.1)

## 2017-09-27 LAB — GLUCOSE, CAPILLARY: Glucose-Capillary: 121 mg/dL — ABNORMAL HIGH (ref 65–99)

## 2017-09-27 NOTE — Progress Notes (Signed)
Physical Therapy Treatment Patient Details Name: Kerri Rangel MRN: [id number] DOB: 1923/11/14 Today's Date: 09/27/2017    History of Present Illness Pt admitted for L hip fracture and is now s/p L hip IM nailing on 09/26/17. Pt with complaints of fall while getting into car. History of advanced dementia, HTN, GERD, and depression. Pt A&O x 1 this date, poor historian. Most of history obtained from chart.    PT Comments    Pt is making limited progress towards goals. Upon arrival, pt uncomfortable in recliner and very fatigued. Needs +2 assist for transfer back to bed. Able to stand for donning depends, however then starts to buckle and needs seated rest break. 2nd attempt to stand pivot transfer with +2 assist back to bed then requires assist for positioning. Appears to be in mild pain with repositioning. B LE elevated on pillows. Will continue to assess.   Follow Up Recommendations  SNF     Equipment Recommendations  Rolling walker with 5" wheels    Recommendations for Other Services       Precautions / Restrictions Precautions Precautions: Fall Restrictions Weight Bearing Restrictions: No    Mobility  Bed Mobility Overal bed mobility: Needs Assistance Bed Mobility: Sit to Supine       Sit to supine: Max assist;+2 for physical assistance   General bed mobility comments: needs assist for transitioning back to bed in addition to sliding up in bed.  Transfers Overall transfer level: Needs assistance Equipment used: Rolling walker (2 wheeled) Transfers: Sit to/from Stand Sit to Stand: Max assist;+2 physical assistance         General transfer comment: needs heavy cues for technique. Able to stand twice, however fatigues quickly  Ambulation/Gait Ambulation/Gait assistance: Max assist;+2 physical assistance Ambulation Distance (Feet): 1 Feet Assistive device: Rolling walker (2 wheeled) Gait Pattern/deviations: Step-to pattern     General Gait Details: Attempted  ambulation back to bed, however pt very fatigued and required seated rest break. Unable to further ambulate this date. Required max A +2 stand pivot transfer to bed.   Stairs            Wheelchair Mobility    Modified Rankin (Stroke Patients Only)       Balance                                            Cognition Arousal/Alertness: Awake/alert Behavior During Therapy: WFL for tasks assessed/performed Overall Cognitive Status: History of cognitive impairments - at baseline                                        Exercises Other Exercises Other Exercises: Pt very visably fatigued, unable to follow commands for ther-ex this date as she was falling asleep. Deferred    General Comments        Pertinent Vitals/Pain Pain Assessment: Faces Faces Pain Scale: Hurts a little bit Pain Location: L hip Pain Descriptors / Indicators: Operative site guarding Pain Intervention(s): Limited activity within patient's tolerance    Home Living                      Prior Function            PT Goals (current goals can now be found in the  care plan section) Acute Rehab PT Goals Patient Stated Goal: unable to state PT Goal Formulation: Patient unable to participate in goal setting Time For Goal Achievement: 10/11/17 Potential to Achieve Goals: Fair Progress towards PT goals: Progressing toward goals    Frequency    BID      PT Plan Current plan remains appropriate    Co-evaluation              AM-PAC PT "6 Clicks" Daily Activity  Outcome Measure  Difficulty turning over in bed (including adjusting bedclothes, sheets and blankets)?: Unable Difficulty moving from lying on back to sitting on the side of the bed? : Unable Difficulty sitting down on and standing up from a chair with arms (e.g., wheelchair, bedside commode, etc,.)?: Unable Help needed moving to and from a bed to chair (including a wheelchair)?: A Lot Help  needed walking in hospital room?: A Lot Help needed climbing 3-5 steps with a railing? : Total 6 Click Score: 8    End of Session Equipment Utilized During Treatment: Gait belt Activity Tolerance: Patient limited by fatigue Patient left: in bed;with bed alarm set;with family/visitor present;with SCD's reapplied Nurse Communication: Mobility status PT Visit Diagnosis: Unsteadiness on feet (R26.81);Muscle weakness (generalized) (M62.81);History of falling (Z91.81);Difficulty in walking, not elsewhere classified (R26.2);Pain Pain - Right/Left: Left Pain - part of body: Hip     Time: 0315-9458 PT Time Calculation (min) (ACUTE ONLY): 23 min  Charges:  $Therapeutic Exercise: 8-22 mins $Therapeutic Activity: 8-22 mins                    G Codes:       Greggory Stallion, PT, DPT [phone]    Kerri Rangel 09/27/2017, 4:55 PM

## 2017-09-27 NOTE — Progress Notes (Addendum)
Subjective:  Patient reports pain as mild.  Family in the room.  Objective:   VITALS:   Vitals:   09/27/17 0357 09/27/17 0418 09/27/17 0813 09/27/17 1259  BP: (!) 120/45  (!) 119/37 104/62  Pulse: 81  79 61  Resp: 19  18 14   Temp: 98.7 F (37.1 C)  98.5 F (36.9 C) 98.1 F (36.7 C)  TempSrc: Oral  Oral Oral  SpO2: 96%  100% 97%  Weight:  79.8 kg (176 lb)    Height:        PHYSICAL EXAM:  ABD soft Intact pulses distally Dorsiflexion/Plantar flexion intact Incision: scant drainage Compartment soft  LABS  Results for orders placed or performed during the hospital encounter of 09/25/17 (from the past 24 hour(s))  Glucose, capillary     Status: Abnormal   Collection Time: 09/26/17  6:03 PM  Result Value Ref Range   Glucose-Capillary 100 (H) 65 - 99 mg/dL   Comment 1 CLEANED    Comment 2 Repeat Test   Comprehensive metabolic panel     Status: Abnormal   Collection Time: 09/27/17  3:34 AM  Result Value Ref Range   Sodium 146 (H) 135 - 145 mmol/L   Potassium 3.9 3.5 - 5.1 mmol/L   Chloride 112 (H) 101 - 111 mmol/L   CO2 24 22 - 32 mmol/L   Glucose, Bld 119 (H) 65 - 99 mg/dL   BUN 19 6 - 20 mg/dL   Creatinine, Ser 1.06 (H) 0.44 - 1.00 mg/dL   Calcium 8.2 (L) 8.9 - 10.3 mg/dL   Total Protein 4.7 (L) 6.5 - 8.1 g/dL   Albumin 2.4 (L) 3.5 - 5.0 g/dL   AST 28 15 - 41 U/L   ALT 10 (L) 14 - 54 U/L   Alkaline Phosphatase 67 38 - 126 U/L   Total Bilirubin 0.8 0.3 - 1.2 mg/dL   GFR calc non Af Amer 44 (L) >60 mL/min   GFR calc Af Amer 52 (L) >60 mL/min   Anion gap 10 5 - 15  Glucose, capillary     Status: Abnormal   Collection Time: 09/27/17  7:47 AM  Result Value Ref Range   Glucose-Capillary 121 (H) 65 - 99 mg/dL    Ct Head Wo Contrast  Result Date: 09/25/2017 CLINICAL DATA:  Posterior headache following a fall getting into a car. EXAM: CT HEAD WITHOUT CONTRAST TECHNIQUE: Contiguous axial images were obtained from the base of the skull through the vertex without  intravenous contrast. COMPARISON:  03/08/2015. FINDINGS: Brain: Diffusely enlarged ventricles and subarachnoid spaces. Patchy white matter low density in both cerebral hemispheres. 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 Interval decrease in size of an old left internal capsule infarct at the juncture with the thalamus. No intracranial hemorrhage, mass lesion or CT evidence of acute infarction. Vascular: No hyperdense vessel or unexpected calcification. Skull: Bilateral hyperostosis frontalis.  No skull fracture. Sinuses/Orbits: Underdeveloped right mastoid air cells without significant change. Status post bilateral cataract removal. Other: Scalp hematoma at the skull vertex. Small amount of associated soft tissue air on the left, compatible with a small laceration. IMPRESSION: 1. Scalp hematoma and laceration at the skull vertex without skull fracture or intracranial hemorrhage. 2. Atrophy, chronic small vessel white matter ischemic changes and old infarcts, as described above. Electronically Signed   By: Claudie Revering M.D.   On: 09/25/2017 19:57   Dg Chest Portable 1 View  Result Date: 09/25/2017 CLINICAL DATA:  Recent fall with left hip pain  EXAM: PORTABLE CHEST 1 VIEW COMPARISON:  02/24/2013 FINDINGS: Cardiac shadow is stable. Aortic calcifications are again seen. Elevation right hemidiaphragm is noted. No focal infiltrate or sizable effusion is seen. No definitive bony abnormality is noted. IMPRESSION: No active disease. Electronically Signed   By: Inez Catalina M.D.   On: 09/25/2017 21:08   Dg Hip Operative Unilat W Or W/o Pelvis Left  Result Date: 09/26/2017 CLINICAL DATA:  Surgery EXAM: OPERATIVE RIGHT HIP (WITH PELVIS IF PERFORMED) 21 VIEWS TECHNIQUE: Fluoroscopic spot image(s) were submitted for interpretation post-operatively. COMPARISON:  Plain film of the left hip performed on 09/25/2017. FINDINGS: Intraoperative fluoroscopic spot images show placement of an intramedullary rod within the  proximal right femur and dynamic compression screw traversing the right femoral neck. Hardware appears intact and appropriately positioned. Distal locking screws appear intact and appropriately positioned. Fluoroscopy was provided for 1 minute 17 seconds. IMPRESSION: Intraoperative fluoroscopic spot images showing placement of intramedullary rod and dynamic compression screw traversing the right femoral neck. No evidence of surgical complicating feature. Electronically Signed   By: Franki Cabot M.D.   On: 09/26/2017 19:31   Dg Hip Unilat W Or Wo Pelvis 2-3 Views Left  Result Date: 09/25/2017 CLINICAL DATA:  Left hip pain following a fall. EXAM: DG HIP (WITH OR WITHOUT PELVIS) 2-3V LEFT COMPARISON:  None. FINDINGS: Proximal left femoral subtrochanteric fracture with 1 shaft width of medial displacement and mild posterior displacement of the distal fragment. There is also mild posterior angulation of the distal fragment. Diffuse osteopenia. Multiple pelvic surgical clips. Lower lumbar spine degenerative changes. IMPRESSION: Significantly displaced left subtrochanteric fracture. Electronically Signed   By: Claudie Revering M.D.   On: 09/25/2017 21:18    Assessment/Plan: 1 Day Post-Op   Active Problems:   Closed left hip fracture (HCC)   Advance diet Up with therapy Discharge to SNF when medically stable.  Return to clinic in 12 to 14 days for staple removal.   Lovell Sheehan , MD 09/27/2017, 1:07 PM

## 2017-09-27 NOTE — Progress Notes (Signed)
Clinical Social Worker (CSW) presented SNF bed offers to patient's son Richardson Landry and daughter [name]. They chose [facility] authorization is pending. [facility] liaison is aware of above.   [employer], LCSW [phone]

## 2017-09-27 NOTE — Evaluation (Signed)
Physical Therapy Evaluation Patient Details Name: Kerri Rangel MRN: [id number] DOB: 07-04-1924 Today's Date: 09/27/2017   History of Present Illness  Pt admitted for L hip fracture and is now s/p L hip IM nailing on 09/26/17. Pt with complaints of fall while getting into car. History of advanced dementia, HTN, GERD, and depression. Pt A&O x 1 this date, poor historian. Most of history obtained from chart.  Clinical Impression  Pt is a pleasant 84 year old female who was admitted for L hip fracture s/p IM nailing. Pt performs bed mobility, transfers, and ambulation with max assist +2 and RW. Able to take several steps to recliner and follow cues. Pt demonstrates deficits with strength/pain/mobility. Limited ability to follow commands for supine there-ex. Fatigues quickly with exertion and needs +2 for all mobility. Would benefit from skilled PT to address above deficits and promote optimal return to PLOF; recommend transition to STR upon discharge from acute hospitalization.       Follow Up Recommendations SNF    Equipment Recommendations  Rolling walker with 5" wheels    Recommendations for Other Services       Precautions / Restrictions Precautions Precautions: Fall Restrictions Weight Bearing Restrictions: No      Mobility  Bed Mobility Overal bed mobility: Needs Assistance Bed Mobility: Supine to Sit     Supine to sit: Max assist;+2 for physical assistance     General bed mobility comments: needs cues for sequencing. Pt becomes fearful of movement, however participates with therapy. Once seated at EOB, pt able to maintain upright posture with cga  Transfers Overall transfer level: Needs assistance Equipment used: Rolling walker (2 wheeled) Transfers: Sit to/from Stand Sit to Stand: Max assist;+2 physical assistance         General transfer comment: needs cues for technique. Pt initially buckles with forward flexed posture, however able to self correct with cues.  Upright posture achieved  Ambulation/Gait Ambulation/Gait assistance: Max assist;+2 physical assistance Ambulation Distance (Feet): 2 Feet Assistive device: Rolling walker (2 wheeled) Gait Pattern/deviations: Step-to pattern     General Gait Details: takes 2 inital steps to recliner, however buckling noted. Able to pull chair under patient with controlled descent to recliner. Pt able to follow commands for stepping, however limited by fatigue  Stairs            Wheelchair Mobility    Modified Rankin (Stroke Patients Only)       Balance Overall balance assessment: Needs assistance;History of Falls Sitting-balance support: Feet supported Sitting balance-Leahy Scale: Good     Standing balance support: Bilateral upper extremity supported Standing balance-Leahy Scale: Fair                               Pertinent Vitals/Pain Pain Assessment: Faces Faces Pain Scale: Hurts little more Pain Location: L hip Pain Descriptors / Indicators: Operative site guarding Pain Intervention(s): Limited activity within patient's tolerance    Home Living Family/patient expects to be discharged to:: Private residence Living Arrangements: Children Available Help at Discharge: Family(unclear if they provide 24/7 assist) Type of Home: House Home Access: Stairs to enter Entrance Stairs-Rails: (unable to answer) Entrance Stairs-Number of Steps: 3 Home Layout: (unclear) Home Equipment: Walker - 2 wheels(unclear if she uses other AD)      Prior Function Level of Independence: Needs assistance         Comments: pt is poor historian, unclear how much assist she requires at baseline,  however per chart was ambulatory with RW     Hand Dominance        Extremity/Trunk Assessment   Upper Extremity Assessment Upper Extremity Assessment: Generalized weakness(B UE grossly 3+/5)    Lower Extremity Assessment Lower Extremity Assessment: Generalized weakness(L LE grossly  2/5; R LE grossly 3/5)       Communication   Communication: No difficulties  Cognition Arousal/Alertness: Awake/alert Behavior During Therapy: WFL for tasks assessed/performed Overall Cognitive Status: No family/caregiver present to determine baseline cognitive functioning                                        General Comments      Exercises Other Exercises Other Exercises: Limited ability to follow commands for ther-ex. Able to perform 10 reps of ankle pumps and hip abd/add with mod assist. Attempted quad sets, unable to fully perform correct technique   Assessment/Plan    PT Assessment Patient needs continued PT services  PT Problem List Decreased strength;Decreased activity tolerance;Decreased balance;Decreased mobility;Pain       PT Treatment Interventions DME instruction;Gait training;Therapeutic exercise;Balance training    PT Goals (Current goals can be found in the Care Plan section)  Acute Rehab PT Goals Patient Stated Goal: unable to state PT Goal Formulation: Patient unable to participate in goal setting Time For Goal Achievement: 10/11/17 Potential to Achieve Goals: Fair    Frequency BID   Barriers to discharge        Co-evaluation               AM-PAC PT "6 Clicks" Daily Activity  Outcome Measure Difficulty turning over in bed (including adjusting bedclothes, sheets and blankets)?: Unable Difficulty moving from lying on back to sitting on the side of the bed? : Unable Difficulty sitting down on and standing up from a chair with arms (e.g., wheelchair, bedside commode, etc,.)?: Unable Help needed moving to and from a bed to chair (including a wheelchair)?: A Lot Help needed walking in hospital room?: A Lot Help needed climbing 3-5 steps with a railing? : Total 6 Click Score: 8    End of Session Equipment Utilized During Treatment: Gait belt Activity Tolerance: Patient limited by fatigue Patient left: in chair;with chair alarm  set;with SCD's reapplied Nurse Communication: Mobility status PT Visit Diagnosis: Unsteadiness on feet (R26.81);Muscle weakness (generalized) (M62.81);History of falling (Z91.81);Difficulty in walking, not elsewhere classified (R26.2);Pain Pain - Right/Left: Left Pain - part of body: Hip    Time: 9628-3662 PT Time Calculation (min) (ACUTE ONLY): 20 min   Charges:   PT Evaluation $PT Eval Moderate Complexity: 1 Mod PT Treatments $Therapeutic Exercise: 8-22 mins   PT G Codes:        Greggory Stallion, PT, DPT [phone]   Hagen Tidd 09/27/2017, 11:17 AM

## 2017-09-27 NOTE — Progress Notes (Signed)
Patient and family refused low bed. 

## 2017-09-27 NOTE — Anesthesia Postprocedure Evaluation (Signed)
Anesthesia Post Note  Patient: Kerri Rangel  Procedure(s) Performed: INTRAMEDULLARY (IM) NAIL INTERTROCHANTRIC (Left Hip)  Patient location during evaluation: PACU Anesthesia Type: Spinal Level of consciousness: oriented and awake and alert Pain management: pain level controlled Vital Signs Assessment: post-procedure vital signs reviewed and stable Respiratory status: spontaneous breathing, respiratory function stable and patient connected to nasal cannula oxygen Cardiovascular status: blood pressure returned to baseline and stable Postop Assessment: no headache, no backache and no apparent nausea or vomiting Anesthetic complications: no     Last Vitals:  Vitals:   09/27/17 0040 09/27/17 0357  BP: (!) 145/44 (!) 120/45  Pulse: (!) 116 81  Resp: 19 19  Temp: 37.7 C 37.1 C  SpO2: 98% 96%    Last Pain:  Vitals:   09/27/17 0357  TempSrc: Oral  PainSc:                  Kerri Rangel

## 2017-09-27 NOTE — Progress Notes (Signed)
[facility] [place] NAME: Kerri Rangel    MR#:  [id number]  DATE OF BIRTH:  03/31/1924  SUBJECTIVE:  CHIEF COMPLAINT:  Left hip pain ok, family bed side  REVIEW OF SYSTEMS:  CONSTITUTIONAL: No fever, fatigue or weakness.  EYES: No blurred or double vision.  EARS, NOSE, AND THROAT: No tinnitus or ear pain.  RESPIRATORY: No cough, shortness of breath, wheezing or hemoptysis.  CARDIOVASCULAR: No chest pain, orthopnea, edema.  GASTROINTESTINAL: No nausea, vomiting, diarrhea or abdominal pain.  GENITOURINARY: No dysuria, hematuria.  ENDOCRINE: No polyuria, nocturia,  HEMATOLOGY: No anemia, easy bruising or bleeding SKIN: No rash or lesion. MUSCULOSKELETAL:left hip pain NEUROLOGIC: No tingling, numbness, weakness.  PSYCHIATRY: No anxiety or depression.   DRUG ALLERGIES:  No Known Allergies  VITALS:  Blood pressure 104/62, pulse 61, temperature 98.1 F (36.7 C), temperature source Oral, resp. rate 14, height 5' 2.5" (1.588 m), weight 79.8 kg (176 lb), SpO2 97 %.  PHYSICAL EXAMINATION:  GENERAL:  84 y.o.-year-old patient lying in the bed with no acute distress.  EYES: Pupils equal, round, reactive to light and accommodation. No scleral icterus. Extraocular muscles intact.  HEENT: Head atraumatic, normocephalic. Oropharynx and nasopharynx clear.  NECK:  Supple, no jugular venous distention. No thyroid enlargement, no tenderness.  LUNGS: Normal breath sounds bilaterally, no wheezing, rales,rhonchi or crepitation. No use of accessory muscles of respiration.  CARDIOVASCULAR: S1, S2 normal. No murmurs, rubs, or gallops.  ABDOMEN: Soft, nontender, nondistended. Bowel sounds present. No organomegaly or mass.  EXTREMITIES: left hip is tender , has clean honeycomb dressing NEUROLOGIC: Cranial nerves II through XII are intact. . Sensation intact. Gait not checked.  PSYCHIATRIC: The patient is alert and oriented x 3.  SKIN: No obvious  rash, lesion, or ulcer.    LABORATORY PANEL:   CBC Recent Labs  Lab 09/26/17 0340  WBC 12.2*  HGB 9.4*  HCT 28.7*  PLT 204   ------------------------------------------------------------------------------------------------------------------  Chemistries  Recent Labs  Lab 09/27/17 0334  NA 146*  K 3.9  CL 112*  CO2 24  GLUCOSE 119*  BUN 19  CREATININE 1.06*  CALCIUM 8.2*  AST 28  ALT 10*  ALKPHOS 67  BILITOT 0.8   ------------------------------------------------------------------------------------------------------------------  Cardiac Enzymes No results for input(s): TROPONINI in the last 168 hours. ------------------------------------------------------------------------------------------------------------------  RADIOLOGY:  Ct Head Wo Contrast  Result Date: 09/25/2017 CLINICAL DATA:  Posterior headache following a fall getting into a car. EXAM: CT HEAD WITHOUT CONTRAST TECHNIQUE: Contiguous axial images were obtained from the base of the skull through the vertex without intravenous contrast. COMPARISON:  03/08/2015. FINDINGS: Brain: Diffusely enlarged ventricles and subarachnoid spaces. Patchy white matter low density in both cerebral hemispheres. 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 Interval decrease in size of an old left internal capsule infarct at the juncture with the thalamus. No intracranial hemorrhage, mass lesion or CT evidence of acute infarction. Vascular: No hyperdense vessel or unexpected calcification. Skull: Bilateral hyperostosis frontalis.  No skull fracture. Sinuses/Orbits: Underdeveloped right mastoid air cells without significant change. Status post bilateral cataract removal. Other: Scalp hematoma at the skull vertex. Small amount of associated soft tissue air on the left, compatible with a small laceration. IMPRESSION: 1. Scalp hematoma and laceration at the skull vertex without skull fracture or intracranial hemorrhage. 2. Atrophy, chronic small  vessel white matter ischemic changes and old infarcts, as described above. Electronically Signed   By: Claudie Revering M.D.   On: 09/25/2017 19:57  Dg Chest Portable 1 View  Result Date: 09/25/2017 CLINICAL DATA:  Recent fall with left hip pain EXAM: PORTABLE CHEST 1 VIEW COMPARISON:  02/24/2013 FINDINGS: Cardiac shadow is stable. Aortic calcifications are again seen. Elevation right hemidiaphragm is noted. No focal infiltrate or sizable effusion is seen. No definitive bony abnormality is noted. IMPRESSION: No active disease. Electronically Signed   By: Inez Catalina M.D.   On: 09/25/2017 21:08   Dg Hip Operative Unilat W Or W/o Pelvis Left  Result Date: 09/26/2017 CLINICAL DATA:  Surgery EXAM: OPERATIVE RIGHT HIP (WITH PELVIS IF PERFORMED) 21 VIEWS TECHNIQUE: Fluoroscopic spot image(s) were submitted for interpretation post-operatively. COMPARISON:  Plain film of the left hip performed on 09/25/2017. FINDINGS: Intraoperative fluoroscopic spot images show placement of an intramedullary rod within the proximal right femur and dynamic compression screw traversing the right femoral neck. Hardware appears intact and appropriately positioned. Distal locking screws appear intact and appropriately positioned. Fluoroscopy was provided for 1 minute 17 seconds. IMPRESSION: Intraoperative fluoroscopic spot images showing placement of intramedullary rod and dynamic compression screw traversing the right femoral neck. No evidence of surgical complicating feature. Electronically Signed   By: Franki Cabot M.D.   On: 09/26/2017 19:31   Dg Hip Unilat W Or Wo Pelvis 2-3 Views Left  Result Date: 09/25/2017 CLINICAL DATA:  Left hip pain following a fall. EXAM: DG HIP (WITH OR WITHOUT PELVIS) 2-3V LEFT COMPARISON:  None. FINDINGS: Proximal left femoral subtrochanteric fracture with 1 shaft width of medial displacement and mild posterior displacement of the distal fragment. There is also mild posterior angulation of the distal  fragment. Diffuse osteopenia. Multiple pelvic surgical clips. Lower lumbar spine degenerative changes. IMPRESSION: Significantly displaced left subtrochanteric fracture. Electronically Signed   By: Claudie Revering M.D.   On: 09/25/2017 21:18    EKG:   Orders placed or performed during the hospital encounter of 09/25/17  . ED EKG  . ED EKG    ASSESSMENT AND PLAN:   .1 Displaced left subtrochanteric fracture, status post mechanical fall.   We will continue pain control prn  Orthopedics consulted s/p intertrochantericintramed nail POD # 1 Outpatient orthopedics follow-up in 2 weeks  2.  Posterior scalp laceration, status post fall, continue wound care.  3.  Advanced dementia, we will restart home medications.  Monitor clinically closely.  4.  Hypokalemia, repleted    Disposition skilled nursing facility   All the records are reviewed and case discussed with Care Management/Social Workerr. Management plans discussed with the patient, family and they are in agreement.  CODE STATUS: fc   TOTAL TIME TAKING CARE OF THIS PATIENT:  35  minutes.   POSSIBLE D/C IN 2  DAYS, DEPENDING ON CLINICAL CONDITION.  Note: This dictation was prepared with Dragon dictation along with smaller phrase technology. Any transcriptional errors that result from this process are unintentional.   Nicholes Mango M.D on 09/27/2017 at 2:52 PM  Between 7am to 6pm - Pager - [phone] After 6pm go to [email or web address] - password [email or web address] [facility]  [facility]  Office  [phone]  CC: Primary care physician; Leonel Ramsay, MD

## 2017-09-28 LAB — COMPREHENSIVE METABOLIC PANEL
ALBUMIN: 2.3 g/dL — AB (ref 3.5–5.0)
ALK PHOS: 68 U/L (ref 38–126)
ALT: 6 U/L — ABNORMAL LOW (ref 14–54)
AST: 22 U/L (ref 15–41)
Anion gap: 8 (ref 5–15)
BILIRUBIN TOTAL: 0.4 mg/dL (ref 0.3–1.2)
BUN: 19 mg/dL (ref 6–20)
CALCIUM: 7.7 mg/dL — AB (ref 8.9–10.3)
CO2: 22 mmol/L (ref 22–32)
Chloride: 114 mmol/L — ABNORMAL HIGH (ref 101–111)
Creatinine, Ser: 1.08 mg/dL — ABNORMAL HIGH (ref 0.44–1.00)
GFR calc Af Amer: 50 mL/min — ABNORMAL LOW (ref >60)
GFR calc non Af Amer: 44 mL/min — ABNORMAL LOW (ref >60)
GLUCOSE: 108 mg/dL — AB (ref 65–99)
Potassium: 3.4 mmol/L — ABNORMAL LOW (ref 3.5–5.1)
Sodium: 144 mmol/L (ref 135–145)
TOTAL PROTEIN: 4.8 g/dL — AB (ref 6.5–8.1)

## 2017-09-28 LAB — GLUCOSE, CAPILLARY
Glucose-Capillary: 111 mg/dL — ABNORMAL HIGH (ref 65–99)
Glucose-Capillary: 126 mg/dL — ABNORMAL HIGH (ref 65–99)

## 2017-09-28 LAB — CBC
HCT: 19.1 % — ABNORMAL LOW (ref 35.0–47.0)
Hemoglobin: 6.3 g/dL — ABNORMAL LOW (ref 12.0–16.0)
MCH: 28.9 pg (ref 26.0–34.0)
MCHC: 32.8 g/dL (ref 32.0–36.0)
MCV: 88.2 fL (ref 80.0–100.0)
Platelets: 163 10*3/uL (ref 150–440)
RBC: 2.16 MIL/uL — ABNORMAL LOW (ref 3.80–5.20)
RDW: 14.7 % — ABNORMAL HIGH (ref 11.5–14.5)
WBC: 9.1 10*3/uL (ref 3.6–11.0)

## 2017-09-28 LAB — ABO/RH: ABO/RH(D): O POS

## 2017-09-28 LAB — OCCULT BLOOD X 1 CARD TO LAB, STOOL: FECAL OCCULT BLD: POSITIVE — AB

## 2017-09-28 LAB — VITAMIN B12: Vitamin B-12: 1048 pg/mL — ABNORMAL HIGH (ref 180–914)

## 2017-09-28 LAB — PREPARE RBC (CROSSMATCH)

## 2017-09-28 LAB — FERRITIN: FERRITIN: 23 ng/mL (ref 11–307)

## 2017-09-28 MED ORDER — SODIUM CHLORIDE 0.9 % IV SOLN: Freq: Once | INTRAVENOUS | Status: DC

## 2017-09-28 MED ORDER — POTASSIUM CHLORIDE CRYS ER 20 MEQ PO TBCR
40.0000 meq | EXTENDED_RELEASE_TABLET | Freq: Once | ORAL | Status: AC
  Administered 2017-09-28: 40 meq via ORAL
  Filled 2017-09-28: qty 2

## 2017-09-28 MED ORDER — ACETAMINOPHEN 325 MG PO TABS
650.0000 mg | ORAL_TABLET | Freq: Once | ORAL | Status: AC
  Administered 2017-09-28: 650 mg via ORAL
  Filled 2017-09-28: qty 2

## 2017-09-28 MED ORDER — ASPIRIN EC 81 MG PO TBEC
81.0000 mg | DELAYED_RELEASE_TABLET | Freq: Every day | ORAL | Status: DC
  Administered 2017-09-29 – 2017-09-30 (×2): 81 mg via ORAL
  Filled 2017-09-28 (×2): qty 1

## 2017-09-28 MED ORDER — FUROSEMIDE 10 MG/ML IJ SOLN
20.0000 mg | Freq: Once | INTRAMUSCULAR | Status: AC
  Administered 2017-09-28: 20 mg via INTRAVENOUS
  Filled 2017-09-28: qty 4

## 2017-09-28 NOTE — Progress Notes (Signed)
Subjective:  Patient reports pain as mild.    Objective:   VITALS:   Vitals:   09/28/17 0342 09/28/17 0500 09/28/17 0727 09/28/17 1016  BP: (!) 121/54  (!) 138/40 (!) 121/28  Pulse: 71  67 77  Resp: 18  19 20   Temp: 99.1 F (37.3 C)  99 F (37.2 C) 99.8 F (37.7 C)  TempSrc: Axillary  Oral Axillary  SpO2: 92%  96% 91%  Weight:  80.3 kg (177 lb 2.1 oz)    Height:        PHYSICAL EXAM:  Dorsiflexion/Plantar flexion intact Incision: scant drainage Compartment soft  LABS  Results for orders placed or performed during the hospital encounter of 09/25/17 (from the past 24 hour(s))  Comprehensive metabolic panel     Status: Abnormal   Collection Time: 09/28/17  3:45 AM  Result Value Ref Range   Sodium 144 135 - 145 mmol/L   Potassium 3.4 (L) 3.5 - 5.1 mmol/L   Chloride 114 (H) 101 - 111 mmol/L   CO2 22 22 - 32 mmol/L   Glucose, Bld 108 (H) 65 - 99 mg/dL   BUN 19 6 - 20 mg/dL   Creatinine, Ser 1.08 (H) 0.44 - 1.00 mg/dL   Calcium 7.7 (L) 8.9 - 10.3 mg/dL   Total Protein 4.8 (L) 6.5 - 8.1 g/dL   Albumin 2.3 (L) 3.5 - 5.0 g/dL   AST 22 15 - 41 U/L   ALT 6 (L) 14 - 54 U/L   Alkaline Phosphatase 68 38 - 126 U/L   Total Bilirubin 0.4 0.3 - 1.2 mg/dL   GFR calc non Af Amer 44 (L) >60 mL/min   GFR calc Af Amer 50 (L) >60 mL/min   Anion gap 8 5 - 15  CBC     Status: Abnormal   Collection Time: 09/28/17  3:45 AM  Result Value Ref Range   WBC 9.1 3.6 - 11.0 K/uL   RBC 2.16 (L) 3.80 - 5.20 MIL/uL   Hemoglobin 6.3 (L) 12.0 - 16.0 g/dL   HCT 19.1 (L) 35.0 - 47.0 %   MCV 88.2 80.0 - 100.0 fL   MCH 28.9 26.0 - 34.0 pg   MCHC 32.8 32.0 - 36.0 g/dL   RDW 14.7 (H) 11.5 - 14.5 %   Platelets 163 150 - 440 K/uL  Ferritin     Status: None   Collection Time: 09/28/17  3:45 AM  Result Value Ref Range   Ferritin 23 11 - 307 ng/mL  ABO/Rh     Status: None   Collection Time: 09/28/17  3:45 AM  Result Value Ref Range   ABO/RH(D)      O POS Performed at [facility], [address] [place], [place] [address]   Glucose, capillary     Status: Abnormal   Collection Time: 09/28/17  7:24 AM  Result Value Ref Range   Glucose-Capillary 111 (H) 65 - 99 mg/dL   Comment 1 Notify RN   Prepare RBC     Status: None   Collection Time: 09/28/17  8:14 AM  Result Value Ref Range   Order Confirmation      ORDER PROCESSED BY BLOOD BANK Performed at [facility], [address] [place], [place] [address]   Type and screen [place]     Status: None (Preliminary result)   Collection Time: 09/28/17  8:14 AM  Result Value Ref Range   ABO/RH(D) O POS    Antibody Screen  NEG    Sample Expiration 10/01/2017    Unit Number [id number]    Blood Component Type RBC, LR IRR    Unit division 00    Status of Unit ISSUED    Transfusion Status OK TO TRANSFUSE    Crossmatch Result      Compatible Performed at [facility], [address] [place], [place] [address]     Dg Hip Operative Unilat W Or W/o Pelvis Left  Result Date: 09/26/2017 CLINICAL DATA:  Surgery EXAM: OPERATIVE RIGHT HIP (WITH PELVIS IF PERFORMED) 21 VIEWS TECHNIQUE: Fluoroscopic spot image(s) were submitted for interpretation post-operatively. COMPARISON:  Plain film of the left hip performed on 09/25/2017. FINDINGS: Intraoperative fluoroscopic spot images show placement of an intramedullary rod within the proximal right femur and dynamic compression screw traversing the right femoral neck. Hardware appears intact and appropriately positioned. Distal locking screws appear intact and appropriately positioned. Fluoroscopy was provided for 1 minute 17 seconds. IMPRESSION: Intraoperative fluoroscopic spot images showing placement of intramedullary rod and dynamic compression screw traversing the right femoral neck. No evidence of surgical complicating feature. Electronically Signed   By: Franki Cabot M.D.   On: 09/26/2017 19:31    Assessment/Plan: 2 Days Post-Op   Active  Problems:   Closed left hip fracture (HCC) Acute blood loss anemia  Transfuse for acute blood loss anemia Discharge to SNF possibly tomorrow   Lovell Sheehan , MD 09/28/2017, 10:46 AM

## 2017-09-28 NOTE — Clinical Social Work Note (Signed)
CSW contacted [employer] to discuss insurance authorization. Currently, no determination has been made. CSW updated the team that the patient should discharge tomorrow due to needing a transfusion today. [facility] will contact this CSW once a determination has been made, possibly today. CSW is following.  Santiago Bumpers, MSW, Latanya Presser [phone]

## 2017-09-28 NOTE — Progress Notes (Signed)
Sent text page to Dr. Leslye Peer regarding patient's potassium and hgb.

## 2017-09-28 NOTE — Progress Notes (Signed)
Patient ID: Kerri Rangel, female   DOB: 1924-05-10, 84 y.o.   MRN: [id number]  [facility] PROGRESS NOTE  Kerri Rangel [facility]:[id number] DOB: Mar 01, 1924 DOA: 09/25/2017 PCP: Leonel Ramsay, MD  HPI/Subjective: Patient alert.  Answers a few questions.  Not a good historian.  Objective: Vitals:   09/28/17 1100 09/28/17 1515  BP: (!) 117/36 (!) (P) 155/45  Pulse: (P) 66 (P) 68  Resp: 19 (P) 18  Temp:  (P) 98.6 F (37 C)  SpO2: 93% (P) 93%    Filed Weights   09/26/17 0500 09/27/17 0418 09/28/17 0500  Weight: 79.8 kg (176 lb) 79.8 kg (176 lb) 80.3 kg (177 lb 2.1 oz)    ROS: Review of Systems  Unable to perform ROS: Dementia   Exam: Physical Exam  HENT:  Nose: No mucosal edema.  Mouth/Throat: No oropharyngeal exudate or posterior oropharyngeal edema.  Eyes: Conjunctivae, EOM and lids are normal. Pupils are equal, round, and reactive to light.  Neck: No JVD present. Carotid bruit is not present. No edema present. No thyroid mass and no thyromegaly present.  Cardiovascular: S1 normal and S2 normal. Exam reveals no gallop.  No murmur heard. Pulses:      Dorsalis pedis pulses are 2+ on the right side, and 2+ on the left side.  Respiratory: No respiratory distress. She has no wheezes. She has no rhonchi. She has no rales.  GI: Soft. Bowel sounds are normal. There is no tenderness.  Musculoskeletal:       Right ankle: She exhibits no swelling.       Left ankle: She exhibits no swelling.  Lymphadenopathy:    She has no cervical adenopathy.  Neurological: She is alert.  Skin: Skin is warm. No rash noted. Nails show no clubbing.  Psychiatric: She has a normal mood and affect.      Data Reviewed: Basic Metabolic Panel: Recent Labs  Lab 09/25/17 1956 09/26/17 0340 09/27/17 0334 09/28/17 0345  NA 146* 144 146* 144  K 3.4* 3.5 3.9 3.4*  CL 110 110 112* 114*  CO2 23 23 24 22   GLUCOSE 196* 160* 119* 108*  BUN 14 17 19 19   CREATININE 1.03* 1.06* 1.06* 1.08*   CALCIUM 8.9 8.6* 8.2* 7.7*   Liver Function Tests: Recent Labs  Lab 09/25/17 1956 09/27/17 0334 09/28/17 0345  AST 30 28 22   ALT 11* 10* 6*  ALKPHOS 103 67 68  BILITOT 0.7 0.8 0.4  PROT 6.5 4.7* 4.8*  ALBUMIN 3.4* 2.4* 2.3*   CBC: Recent Labs  Lab 09/25/17 1956 09/26/17 0340 09/28/17 0345  WBC 8.9 12.2* 9.1  NEUTROABS 7.0*  --   --   HGB 12.1 9.4* 6.3*  HCT 36.8 28.7* 19.1*  MCV 87.0 86.8 88.2  PLT 255 204 163    CBG: Recent Labs  Lab 09/26/17 0755 09/26/17 1803 09/27/17 0747 09/28/17 0724 09/28/17 1128  GLUCAP 151* 100* 121* 111* 126*    Recent Results (from the past 240 hour(s))  Surgical PCR screen     Status: Abnormal   Collection Time: 09/26/17  3:57 AM  Result Value Ref Range Status   MRSA, PCR NEGATIVE NEGATIVE Final   Staphylococcus aureus POSITIVE (A) NEGATIVE Final    Comment: (NOTE) The Xpert SA Assay (FDA approved for NASAL specimens in patients 49 years of age and older), is one component of a comprehensive surveillance program. It is not intended to diagnose infection nor to guide or monitor treatment. Performed at [facility], [id number]  [address] [place], [place] [address]      Studies: Dg Hip Operative Unilat W Or W/o Pelvis Left  Result Date: 09/26/2017 CLINICAL DATA:  Surgery EXAM: OPERATIVE RIGHT HIP (WITH PELVIS IF PERFORMED) 21 VIEWS TECHNIQUE: Fluoroscopic spot image(s) were submitted for interpretation post-operatively. COMPARISON:  Plain film of the left hip performed on 09/25/2017. FINDINGS: Intraoperative fluoroscopic spot images show placement of an intramedullary rod within the proximal right femur and dynamic compression screw traversing the right femoral neck. Hardware appears intact and appropriately positioned. Distal locking screws appear intact and appropriately positioned. Fluoroscopy was provided for 1 minute 17 seconds. IMPRESSION: Intraoperative fluoroscopic spot images showing placement of intramedullary rod  and dynamic compression screw traversing the right femoral neck. No evidence of surgical complicating feature. Electronically Signed   By: Franki Cabot M.D.   On: 09/26/2017 19:31    Scheduled Meds: . aspirin EC  81 mg Oral Daily  . atenolol  50 mg Oral Daily  . Chlorhexidine Gluconate Cloth  6 each Topical Daily  . docusate sodium  100 mg Oral BID  . donepezil  5 mg Oral Q1200  . fentaNYL  12.5 mcg Transdermal Q72H  . levothyroxine  25 mcg Oral QAC breakfast  . mirtazapine  30 mg Oral QHS  . mupirocin ointment  1 application Nasal BID  . pantoprazole  40 mg Oral Daily  . senna  1 tablet Oral BID  . sertraline  25 mg Oral Daily  . vitamin B-12  1,000 mcg Oral Daily   Continuous Infusions: . lactated ringers 75 mL/hr at 09/26/17 2327    Assessment/Plan:   1. Acute blood loss anemia.  Hemoglobin dropped down to 6.3 today.  Transfuse 1 unit of packed red blood cells.  Guaiac stools.  As per orthopedics Dr. Harlow Mares change Lovenox over to aspirin. 2. Left hip fracture status post operative repair.  Pain control.  Physical therapy evaluation.  Likely out to rehab tomorrow. 3. Dementia.  Continue psychiatric medications 4. Hypokalemia replaced during hospital course 5. Hypertension on atenolol 6. Chronic pain on Duragesic patch 7. Hypothyroidism unspecified on levothyroxine  Code Status:     Code Status Orders  (From admission, onward)        Start     Ordered   09/25/17 2318  Full code  Continuous     09/25/17 2317    Code Status History    Date Active Date Inactive Code Status Order ID Comments User Context   This patient has a current code status but no historical code status.    Advance Directive Documentation     Most Recent Value  Type of Advance Directive  Healthcare Power of Attorney, Living will  Pre-existing out of facility DNR order (yellow form or pink MOST form)  No data  "MOST" Form in Place?  No data     Family Communication: Spoke with daughter on  phone about blood transfusion. Disposition Plan: Potentially out to rehab tomorrow  Consultants:  Orthopedic surgery  Procedures:  Left hip repair  Time spent: 28 minutes  [place]

## 2017-09-28 NOTE — Progress Notes (Signed)
PT Cancellation Note  Patient Details Name: Kerri Rangel MRN: [id number] DOB: 08/22/1924   Cancelled Treatment:    Reason Eval/Treat Not Completed: Medical issues which prohibited therapy. PT spoke with RN and pt is currently having a blood transfusion 2/2 decr. hemoglobin and reported pt is not medically able to tolerate PT at this time. PT will re-attempt tomorrow, if pt is medically stable.    Miller,Jennifer L 09/28/2017, 2:24 PM   Geoffry Paradise, PT,DPT 09/28/17 2:25 PM

## 2017-09-28 NOTE — Progress Notes (Signed)
PT Cancellation Note  Patient Details Name: Kerri Rangel MRN: [id number] DOB: Oct 22, 1923   Cancelled Treatment:    Reason Eval/Treat Not Completed: Patient not medically ready   Chart reviewed and discussed with nurse.  Hold session this am as HCT 19.1 and HgB 6.3.  Will continue as appropriate.   Chesley Noon 09/28/2017, 10:17 AM

## 2017-09-28 NOTE — Progress Notes (Signed)
Patient's IV infiltrated right after the infusion began.  Restarted blood at 11am. Stay with patient for 15 min and take vitals at 1115

## 2017-09-28 NOTE — Clinical Social Work Note (Signed)
CSW received a call from [place] of the denial for STR/SNF by the medical director based on the patient's anemia and limited progress towards goals. The CSW has updated the attending MD of the situation and provided the number for him to call for peer-to-peer discussion [phone]) through Dr. Lynder Parents. CSW will continue to follow and update as new information is provided.  Santiago Bumpers, MSW, Latanya Presser [phone]

## 2017-09-29 ENCOUNTER — Inpatient Hospital Stay: Payer: PPO

## 2017-09-29 LAB — COMPREHENSIVE METABOLIC PANEL
ALBUMIN: 2.3 g/dL — AB (ref 3.5–5.0)
ALT: 6 U/L — ABNORMAL LOW (ref 14–54)
ANION GAP: 10 (ref 5–15)
AST: 26 U/L (ref 15–41)
Alkaline Phosphatase: 70 U/L (ref 38–126)
BILIRUBIN TOTAL: 0.8 mg/dL (ref 0.3–1.2)
BUN: 17 mg/dL (ref 6–20)
CO2: 21 mmol/L — ABNORMAL LOW (ref 22–32)
Calcium: 7.8 mg/dL — ABNORMAL LOW (ref 8.9–10.3)
Chloride: 113 mmol/L — ABNORMAL HIGH (ref 101–111)
Creatinine, Ser: 0.92 mg/dL (ref 0.44–1.00)
GFR calc Af Amer: >60 mL/min (ref >60)
GFR calc non Af Amer: 53 mL/min — ABNORMAL LOW (ref >60)
GLUCOSE: 115 mg/dL — AB (ref 65–99)
POTASSIUM: 3.6 mmol/L (ref 3.5–5.1)
Sodium: 144 mmol/L (ref 135–145)
TOTAL PROTEIN: 5 g/dL — AB (ref 6.5–8.1)

## 2017-09-29 LAB — TYPE AND SCREEN
ABO/RH(D): O POS
ANTIBODY SCREEN: NEGATIVE
Unit division: 0

## 2017-09-29 LAB — GLUCOSE, CAPILLARY: Glucose-Capillary: 115 mg/dL — ABNORMAL HIGH (ref 65–99)

## 2017-09-29 LAB — CBC
HEMATOCRIT: 24.9 % — AB (ref 35.0–47.0)
HEMOGLOBIN: 8.5 g/dL — AB (ref 12.0–16.0)
MCH: 29.3 pg (ref 26.0–34.0)
MCHC: 34.2 g/dL (ref 32.0–36.0)
MCV: 85.6 fL (ref 80.0–100.0)
Platelets: 178 10*3/uL (ref 150–440)
RBC: 2.91 MIL/uL — ABNORMAL LOW (ref 3.80–5.20)
RDW: 15.1 % — ABNORMAL HIGH (ref 11.5–14.5)
WBC: 9.4 10*3/uL (ref 3.6–11.0)

## 2017-09-29 LAB — BPAM RBC
Blood Product Expiration Date: 201902092359
ISSUE DATE / TIME: 201902021026
UNIT TYPE AND RH: 9500

## 2017-09-29 MED ORDER — TRAZODONE HCL 50 MG PO TABS: 25.0000 mg | ORAL_TABLET | Freq: Every evening | ORAL | 0 refills | Status: DC | PRN

## 2017-09-29 MED ORDER — HYDROCODONE-ACETAMINOPHEN 10-325 MG PO TABS: 1.0000 | ORAL_TABLET | Freq: Four times a day (QID) | ORAL | 0 refills | Status: DC | PRN

## 2017-09-29 MED ORDER — SODIUM CHLORIDE 0.9 % IV SOLN
400.0000 mg | Freq: Once | INTRAVENOUS | Status: AC
  Administered 2017-09-29: 400 mg via INTRAVENOUS
  Filled 2017-09-29: qty 20

## 2017-09-29 MED ORDER — ASPIRIN 81 MG PO TBEC: 81.0000 mg | DELAYED_RELEASE_TABLET | Freq: Every day | ORAL | 0 refills | Status: AC

## 2017-09-29 MED ORDER — ACETAMINOPHEN 325 MG PO TABS: 650.0000 mg | ORAL_TABLET | Freq: Four times a day (QID) | ORAL | Status: DC | PRN

## 2017-09-29 MED ORDER — DOCUSATE SODIUM 100 MG PO CAPS: 100.0000 mg | ORAL_CAPSULE | Freq: Two times a day (BID) | ORAL | 0 refills | Status: AC

## 2017-09-29 MED ORDER — FENTANYL 12 MCG/HR TD PT72: 12.5000 ug | MEDICATED_PATCH | TRANSDERMAL | 0 refills | Status: AC

## 2017-09-29 NOTE — Discharge Instructions (Addendum)
°  Follow-up with primary care physician at the facility in 3-5 days.  PCP to follow-up on the your final urine sensitivity results and monitor hemoglobin results Follow-up with Dr. Harlow Mares in 10-14 days   Hip Fracture A hip fracture is a fracture of the upper part of your thigh bone (femur). What are the causes? A hip fracture is caused by a direct blow to the side of your hip. This is usually the result of a fall but can occur in other circumstances, such as an automobile accident. What increases the risk? There is an increased risk of hip fractures in people with:  An unsteady walking pattern (gait) and those with conditions that contribute to poor balance, such as Parkinson's disease or dementia.  Osteopenia and osteoporosis.  Cancer that spreads to the leg bones.  Certain metabolic diseases.  What are the signs or symptoms? Symptoms of hip fracture include:  Pain over the injured hip.  Inability to put weight on the leg in which the fracture occurred (although, some patients are able to walk after a hip fracture).  Toes and foot of the affected leg point outward when you lie down.  How is this diagnosed? A physical exam can determine if a hip fracture is likely to have occurred. X-ray exams are needed to confirm the fracture and to look for other injuries. The X-ray exam can help to determine the type of hip fracture. Rarely, the fracture is not visible on an X-ray image and a CT scan or MRI will have to be done. How is this treated? The treatment for a fracture is usually surgery. This means using a screw, nail, or rod to hold the bones in place. Follow these instructions at home: Take all medicines as directed by your health care provider. Contact a health care provider if: Pain continues, even after taking pain medicine. This information is not intended to replace advice given to you by your health care provider. Make sure you discuss any questions you have with your health  care provider. Document Released: 08/13/2005 Document Revised: 01/19/2016 Document Reviewed: 03/25/2013 Elsevier Interactive Patient Education  2017 [employer].

## 2017-09-29 NOTE — Clinical Social Work Note (Signed)
[employer] has provided 7 days authorization for SNF, auth [id number]. The patient was set to discharge today; however, she was febrile earlier in the day, and the attending MD would like to ensure no infection potential. The patient will discharge tomorrow if stable to [facility], Room 607. The family is aware and in agreement with this plan. CSW will continue to follow.  Santiago Bumpers, MSW, Latanya Presser [phone]

## 2017-09-29 NOTE — Progress Notes (Signed)
Patient ID: Kerri Rangel, female   DOB: [birth year], 84 y.o.   MRN: [id number]  Patient looks a lot better this morning.  Answering questions.  The patient states that she walks around.  I spoke with the patient's daughter and they were doing outpatient physical therapy because of her balance.  She states that when she does walk with her walker she is fine.  The problem occurs when she does not walk with her walker and her balance is off.  Hemoglobin responded well to blood transfusion.  Since the patient is trace guaiac positive and has a low ferritin I will give IV iron today.  Waiting on insurance company approval for rehab.  If the insurance company approves rehab, then I can get her out of the hospital today.  If the insurance company does not approve rehab, then I will keep her here to work further with physical therapy.  I feel this is an unsafe discharge home today.  Dr. [name]. Full note to follow versus discharge summary to rehab pending insurance company's decision.

## 2017-09-29 NOTE — Progress Notes (Signed)
  Subjective:  Patient reports pain as mild.  Family sitting with patient.  Objective:   VITALS:   Vitals:   09/28/17 1921 09/29/17 0403 09/29/17 0500 09/29/17 0728  BP: (!) 134/56 (!) 132/43  (!) 143/43  Pulse: 70 78  72  Resp: 18 18  19   Temp: 98.6 F (37 C) 98.3 F (36.8 C)  (!) 101 F (38.3 C)  TempSrc: Oral Oral  Oral  SpO2: 99% 91%  96%  Weight:   81.7 kg (180 lb 1.9 oz)   Height:        PHYSICAL EXAM:  Dorsiflexion/Plantar flexion intact Incision: scant drainage Compartment soft  LABS  Results for orders placed or performed during the hospital encounter of 09/25/17 (from the past 24 hour(s))  Comprehensive metabolic panel     Status: Abnormal   Collection Time: 09/29/17  4:21 AM  Result Value Ref Range   Sodium 144 135 - 145 mmol/L   Potassium 3.6 3.5 - 5.1 mmol/L   Chloride 113 (H) 101 - 111 mmol/L   CO2 21 (L) 22 - 32 mmol/L   Glucose, Bld 115 (H) 65 - 99 mg/dL   BUN 17 6 - 20 mg/dL   Creatinine, Ser 0.92 0.44 - 1.00 mg/dL   Calcium 7.8 (L) 8.9 - 10.3 mg/dL   Total Protein 5.0 (L) 6.5 - 8.1 g/dL   Albumin 2.3 (L) 3.5 - 5.0 g/dL   AST 26 15 - 41 U/L   ALT 6 (L) 14 - 54 U/L   Alkaline Phosphatase 70 38 - 126 U/L   Total Bilirubin 0.8 0.3 - 1.2 mg/dL   GFR calc non Af Amer 53 (L) >60 mL/min   GFR calc Af Amer >60 >60 mL/min   Anion gap 10 5 - 15  CBC     Status: Abnormal   Collection Time: 09/29/17  4:21 AM  Result Value Ref Range   WBC 9.4 3.6 - 11.0 K/uL   RBC 2.91 (L) 3.80 - 5.20 MIL/uL   Hemoglobin 8.5 (L) 12.0 - 16.0 g/dL   HCT 24.9 (L) 35.0 - 47.0 %   MCV 85.6 80.0 - 100.0 fL   MCH 29.3 26.0 - 34.0 pg   MCHC 34.2 32.0 - 36.0 g/dL   RDW 15.1 (H) 11.5 - 14.5 %   Platelets 178 150 - 440 K/uL  Glucose, capillary     Status: Abnormal   Collection Time: 09/29/17  7:39 AM  Result Value Ref Range   Glucose-Capillary 115 (H) 65 - 99 mg/dL   Comment 1 Notify RN     No results found.  Assessment/Plan: 3 Days Post-Op   Active Problems:  Closed left hip fracture [facility])   Up with therapy Discharge to SNF when approved   Lovell Sheehan , MD 09/29/2017, 12:56 PM

## 2017-09-29 NOTE — Clinical Social Work Note (Addendum)
CSW spoke with [place] with [employer] about this case. The original auth request has been rescinded, and a new request has been started. CSW will alert Renee when PT eval note from today is entered. PT is aware of imminent need for this documentation. CSW is following and will update information as available.  11:35 am: CSW contacted Renee to update that the PT note is now available in the patient's chart for the team to view as part of the insurance auth process. Joseph Art will contact the medical director to work on this with the hope of discharge today if authorized.   Santiago Bumpers, MSW, Latanya Presser [phone]

## 2017-09-29 NOTE — Progress Notes (Signed)
Patient ID: Kerri Rangel, female   DOB: [birth year], 84 y.o.   MRN: [id number]  [employer] [facility] PROGRESS NOTE  SELENI MELLER NTI:[id number] DOB: 01/22/1924 DOA: 09/25/2017 PCP: Leonel Ramsay, MD  HPI/Subjective: Patient feeling okay.  Answer some more questions today.  Was not notified of the patient's temperature of 101.  Would like to watch patient 1 more day.  Objective: Vitals:   09/29/17 0403 09/29/17 0728  BP: (!) 132/43 (!) 143/43  Pulse: 78 72  Resp: 18 19  Temp: 98.3 F (36.8 C) (!) 101 F (38.3 C)  SpO2: 91% 96%    Filed Weights   09/27/17 0418 09/28/17 0500 09/29/17 0500  Weight: 79.8 kg (176 lb) 80.3 kg (177 lb 2.1 oz) 81.7 kg (180 lb 1.9 oz)    ROS: Review of Systems  Unable to perform ROS: Dementia   Exam: Physical Exam  HENT:  Nose: No mucosal edema.  Mouth/Throat: No oropharyngeal exudate or posterior oropharyngeal edema.  Eyes: Conjunctivae, EOM and lids are normal. Pupils are equal, round, and reactive to light.  Neck: No JVD present. Carotid bruit is not present. No edema present. No thyroid mass and no thyromegaly present.  Cardiovascular: S1 normal and S2 normal. Exam reveals no gallop.  No murmur heard. Pulses:      Dorsalis pedis pulses are 2+ on the right side, and 2+ on the left side.  Respiratory: No respiratory distress. She has no wheezes. She has no rhonchi. She has no rales.  GI: Soft. Bowel sounds are normal. There is no tenderness.  Musculoskeletal:       Right ankle: She exhibits no swelling.       Left ankle: She exhibits no swelling.  Lymphadenopathy:    She has no cervical adenopathy.  Neurological: She is alert.  Skin: Skin is warm. No rash noted. Nails show no clubbing.  Psychiatric: She has a normal mood and affect.      Data Reviewed: Basic Metabolic Panel: Recent Labs  Lab 09/25/17 1956 09/26/17 0340 09/27/17 0334 09/28/17 0345 09/29/17 0421  NA 146* 144 146* 144 144  K 3.4* 3.5 3.9 3.4* 3.6  CL 110  110 112* 114* 113*  CO2 23 23 24 22  21*  GLUCOSE 196* 160* 119* 108* 115*  BUN 14 17 19 19 17   CREATININE 1.03* 1.06* 1.06* 1.08* 0.92  CALCIUM 8.9 8.6* 8.2* 7.7* 7.8*   Liver Function Tests: Recent Labs  Lab 09/25/17 1956 09/27/17 0334 09/28/17 0345 09/29/17 0421  AST 30 28 22 26   ALT 11* 10* 6* 6*  ALKPHOS 103 67 68 70  BILITOT 0.7 0.8 0.4 0.8  PROT 6.5 4.7* 4.8* 5.0*  ALBUMIN 3.4* 2.4* 2.3* 2.3*   CBC: Recent Labs  Lab 09/25/17 1956 09/26/17 0340 09/28/17 0345 09/29/17 0421  WBC 8.9 12.2* 9.1 9.4  NEUTROABS 7.0*  --   --   --   HGB 12.1 9.4* 6.3* 8.5*  HCT 36.8 28.7* 19.1* 24.9*  MCV 87.0 86.8 88.2 85.6  PLT 255 204 163 178    CBG: Recent Labs  Lab 09/26/17 1803 09/27/17 0747 09/28/17 0724 09/28/17 1128 09/29/17 0739  GLUCAP 100* 121* 111* 126* 115*    Recent Results (from the past 240 hour(s))  Surgical PCR screen     Status: Abnormal   Collection Time: 09/26/17  3:57 AM  Result Value Ref Range Status   MRSA, PCR NEGATIVE NEGATIVE Final   Staphylococcus aureus POSITIVE (A) NEGATIVE Final    Comment: (NOTE)  The Xpert SA Assay (FDA approved for NASAL specimens in patients 16 years of age and older), is one component of a comprehensive surveillance program. It is not intended to diagnose infection nor to guide or monitor treatment. Performed at [facility], [address], [place], [place] [address]      Studies: No results found.  Scheduled Meds: . aspirin EC  81 mg Oral Daily  . atenolol  50 mg Oral Daily  . Chlorhexidine Gluconate Cloth  6 each Topical Daily  . docusate sodium  100 mg Oral BID  . donepezil  5 mg Oral Q1200  . fentaNYL  12.5 mcg Transdermal Q72H  . levothyroxine  25 mcg Oral QAC breakfast  . mirtazapine  30 mg Oral QHS  . mupirocin ointment  1 application Nasal BID  . pantoprazole  40 mg Oral Daily  . senna  1 tablet Oral BID  . sertraline  25 mg Oral Daily  . vitamin B-12  1,000 mcg Oral Daily    Continuous Infusions: . sodium chloride    . iron sucrose 400 mg (09/29/17 1430)  . lactated ringers 75 mL/hr at 09/26/17 2327    Assessment/Plan:   1. Fever.  Watch patient 1 more day.  Obtain a chest x-ray,  urine analysis and urine culture.  Try to hold off on antibiotics.  Patient did receive blood transfusion yesterday.   2. Acute blood loss anemia.  Hemoglobin responded to blood transfusion. 3. Left hip fracture status post operative repair.  Pain control.  Physical therapy evaluation appreciated. 4. Dementia.  Continue psychiatric medications 5. Hypokalemia replaced during hospital course 6. Hypertension on atenolol 7. Chronic pain on Duragesic patch 8. Hypothyroidism unspecified on levothyroxine  Code Status:     Code Status Orders  (From admission, onward)        Start     Ordered   09/25/17 2318  Full code  Continuous     09/25/17 2317    Code Status History    Date Active Date Inactive Code Status Order ID Comments User Context   This patient has a current code status but no historical code status.    Advance Directive Documentation     Most Recent Value  Type of Advance Directive  Healthcare Power of Attorney, Living will  Pre-existing out of facility DNR order (yellow form or pink MOST form)  No data  "MOST" Form in Place?  No data     Family Communication: Spoke with daughter this am on the phone. Disposition Plan: Potentially out to rehab tomorrow  Consultants:  Orthopedic surgery  Procedures:  Left hip repair  Time spent: 35 minutes  [place]

## 2017-09-29 NOTE — Plan of Care (Signed)
  Education: Knowledge of General Education information will improve 09/29/2017 0533 - Progressing by Adekunle Rohrbach, Lucille Passy, RN   Health Behavior/Discharge Planning: Ability to manage health-related needs will improve 09/29/2017 0533 - Progressing by Willim Turnage, Lucille Passy, RN   Clinical Measurements: Ability to maintain clinical measurements within normal limits will improve 09/29/2017 0533 - Progressing by Lonya Johannesen, Lucille Passy, RN Will remain free from infection 09/29/2017 0533 - Progressing by Deandria Klute, Lucille Passy, RN Diagnostic test results will improve 09/29/2017 0533 - Progressing by Vidya Bamford, Lucille Passy, RN Respiratory complications will improve 09/29/2017 0533 - Progressing by Bryna Colander, RN Cardiovascular complication will be avoided 09/29/2017 0533 - Progressing by Neira Bentsen, Lucille Passy, RN

## 2017-09-29 NOTE — Progress Notes (Signed)
Physical Therapy Treatment Patient Details Name: Kerri Rangel MRN: [id number] DOB: [birth year] Today's Date: 09/29/2017    History of Present Illness Pt. admitted for L hip fracture; is now S/p L IM nailing on 09/26/17. Pt.'s PMH is significant for HTN, GERD, depression and advanced dementia.     PT Comments    Patient verbalized her interest in getting up to the chair prior to treatment today. Patient does initiate movement of her RLE in regards to supine to sit (transitioning to sitting on the edge of the bed), but did need maxA to move her UE's and LE's to the edge of the bed. She was able to understand verbal instruction to use her UE's to scoot her RLE so that it was on the floor in preparation for her sit to stand. Patient did require increased cueing for hand placement on the bed/walker to stand, and once in standing, did need cues to look up and avoid trunk flexion. The patient was able to perform 2 separate sit to stands today (one prior to her transfer to the chair); she was visibly fatigued with activity and needed manual assistance to advance her LLE to ambulate ~3-4 steps. The chair did have to be brought to her as her fatigue level increased. Overall distance does appear improved from her last treatment. Patient may benefit from continued PT to address strength, ambulation, gait and transfers while in the hospital.   Follow Up Recommendations  SNF     Equipment Recommendations  Rolling walker with 5" wheels    Recommendations for Other Services       Precautions / Restrictions Precautions Precautions: Fall Restrictions Weight Bearing Restrictions: No    Mobility  Bed Mobility Overal bed mobility: Needs Assistance Bed Mobility: Supine to Sit       Sit to supine: Max assist;+2 for physical assistance   General bed mobility comments: Able to initiate movement of RLE; needs assistance for UE's and LE's; able to push through UE's to scoot to edge and get R foot onto  the floor(able to initiate R leg; assistance needed for UE's/LE)  Transfers Overall transfer level: Needs assistance Equipment used: Rolling walker (2 wheeled) Transfers: Sit to/from Stand Sit to Stand: Max assist;+2 physical assistance;+2 safety/equipment         General transfer comment: Increased cueing needed to push from bed and to stand straight; pt. does fatigue in standing and does need maxA +2 for safety  Ambulation/Gait Ambulation/Gait assistance: Max assist;+2 physical assistance;+2 safety/equipment Ambulation Distance (Feet): (~3-4 feet (forwards/backwards)) Assistive device: Rolling walker (2 wheeled) Gait Pattern/deviations: Step-to pattern;Decreased weight shift to right;Trunk flexed     General Gait Details: Pt. ambulated from bed to chair (67ft forwards-needed manual advancement of L foot and we did have to bring the chair up to her so that she could back into it. Pt. follows cues to work to stand up and does advance the walker; however, her LLE will not advance without assistance.    Stairs            Wheelchair Mobility    Modified Rankin (Stroke Patients Only)       Balance                                            Cognition Arousal/Alertness: Awake/alert Behavior During Therapy: WFL for tasks assessed/performed Overall Cognitive Status: (History of dementia; unsure  if patient is at baseline )                                 General Comments: (Spoke with RN pre treatment; pt. more awake today)      Exercises Other Exercises Other Exercises: Quad sets and ankle pumps bilaterally; pt. did need tactile cueing for quad sets on the L (was able to perform); pt. was very fatigued following ambulation.     General Comments        Pertinent Vitals/Pain Pain Assessment: 0-10 Faces Pain Scale: (3-4) Pain Location: L hip Pain Descriptors / Indicators: Grimacing Pain Intervention(s): Limited activity within  patient's tolerance    Home Living                      Prior Function            PT Goals (current goals can now be found in the care plan section) Progress towards PT goals: Progressing toward goals    Frequency    BID      PT Plan Current plan remains appropriate    Co-evaluation              AM-PAC PT "6 Clicks" Daily Activity  Outcome Measure  Difficulty turning over in bed (including adjusting bedclothes, sheets and blankets)?: Unable Difficulty moving from lying on back to sitting on the side of the bed? : A Lot Difficulty sitting down on and standing up from a chair with arms (e.g., wheelchair, bedside commode, etc,.)?: A Lot Help needed moving to and from a bed to chair (including a wheelchair)?: A Lot Help needed walking in hospital room?: A Lot Help needed climbing 3-5 steps with a railing? : Total 6 Click Score: 10    End of Session Equipment Utilized During Treatment: Gait belt Activity Tolerance: Patient limited by fatigue Patient left: in chair;with call bell/phone within reach;with chair alarm set;with family/visitor present Nurse Communication: Mobility status PT Visit Diagnosis: Unsteadiness on feet (R26.81);Muscle weakness (generalized) (M62.81);History of falling (Z91.81);Difficulty in walking, not elsewhere classified (R26.2) Pain - Right/Left: Left Pain - part of body: Hip     Time: 6811-5726 PT Time Calculation (min) (ACUTE ONLY): 28 min  Charges:  $Therapeutic Exercise: 23-37 mins                    G Codes:        Bertram Denver, PT, DPT, CWCE 09/29/2017, 11:19 AM

## 2017-09-30 DIAGNOSIS — Z781 Physical restraint status: Secondary | ICD-10-CM | POA: Diagnosis not present

## 2017-09-30 DIAGNOSIS — I10 Essential (primary) hypertension: Secondary | ICD-10-CM | POA: Diagnosis not present

## 2017-09-30 DIAGNOSIS — R509 Fever, unspecified: Secondary | ICD-10-CM | POA: Diagnosis not present

## 2017-09-30 DIAGNOSIS — B379 Candidiasis, unspecified: Secondary | ICD-10-CM | POA: Diagnosis not present

## 2017-09-30 DIAGNOSIS — F0150 Vascular dementia without behavioral disturbance: Secondary | ICD-10-CM | POA: Diagnosis not present

## 2017-09-30 DIAGNOSIS — S72002D Fracture of unspecified part of neck of left femur, subsequent encounter for closed fracture with routine healing: Secondary | ICD-10-CM | POA: Diagnosis not present

## 2017-09-30 DIAGNOSIS — E039 Hypothyroidism, unspecified: Secondary | ICD-10-CM | POA: Diagnosis not present

## 2017-09-30 DIAGNOSIS — S72002A Fracture of unspecified part of neck of left femur, initial encounter for closed fracture: Secondary | ICD-10-CM | POA: Diagnosis not present

## 2017-09-30 DIAGNOSIS — M84459A Pathological fracture, hip, unspecified, initial encounter for fracture: Secondary | ICD-10-CM | POA: Diagnosis not present

## 2017-09-30 DIAGNOSIS — S7292XD Unspecified fracture of left femur, subsequent encounter for closed fracture with routine healing: Secondary | ICD-10-CM | POA: Diagnosis not present

## 2017-09-30 DIAGNOSIS — Z7401 Bed confinement status: Secondary | ICD-10-CM | POA: Diagnosis not present

## 2017-09-30 DIAGNOSIS — D510 Vitamin B12 deficiency anemia due to intrinsic factor deficiency: Secondary | ICD-10-CM | POA: Diagnosis not present

## 2017-09-30 DIAGNOSIS — M549 Dorsalgia, unspecified: Secondary | ICD-10-CM | POA: Diagnosis not present

## 2017-09-30 DIAGNOSIS — K219 Gastro-esophageal reflux disease without esophagitis: Secondary | ICD-10-CM | POA: Diagnosis not present

## 2017-09-30 DIAGNOSIS — N390 Urinary tract infection, site not specified: Secondary | ICD-10-CM | POA: Diagnosis not present

## 2017-09-30 DIAGNOSIS — F3289 Other specified depressive episodes: Secondary | ICD-10-CM | POA: Diagnosis not present

## 2017-09-30 DIAGNOSIS — M6281 Muscle weakness (generalized): Secondary | ICD-10-CM | POA: Diagnosis not present

## 2017-09-30 DIAGNOSIS — G4709 Other insomnia: Secondary | ICD-10-CM | POA: Diagnosis not present

## 2017-09-30 DIAGNOSIS — D649 Anemia, unspecified: Secondary | ICD-10-CM | POA: Diagnosis not present

## 2017-09-30 DIAGNOSIS — F0390 Unspecified dementia without behavioral disturbance: Secondary | ICD-10-CM | POA: Diagnosis not present

## 2017-09-30 LAB — GLUCOSE, CAPILLARY: Glucose-Capillary: 99 mg/dL (ref 65–99)

## 2017-09-30 LAB — URINALYSIS, COMPLETE (UACMP) WITH MICROSCOPIC
BILIRUBIN URINE: NEGATIVE
Glucose, UA: NEGATIVE mg/dL
KETONES UR: NEGATIVE mg/dL
Nitrite: POSITIVE — AB
PH: 6 (ref 5.0–8.0)
Protein, ur: NEGATIVE mg/dL
SPECIFIC GRAVITY, URINE: 1.017 (ref 1.005–1.030)
SQUAMOUS EPITHELIAL / LPF: NONE SEEN

## 2017-09-30 MED ORDER — ALBUTEROL SULFATE (2.5 MG/3ML) 0.083% IN NEBU: 2.5000 mg | INHALATION_SOLUTION | Freq: Four times a day (QID) | RESPIRATORY_TRACT | 12 refills | Status: DC | PRN

## 2017-09-30 MED ORDER — CEPHALEXIN 500 MG PO CAPS
500.0000 mg | ORAL_CAPSULE | Freq: Two times a day (BID) | ORAL | Status: DC
  Administered 2017-09-30: 500 mg via ORAL
  Filled 2017-09-30: qty 1

## 2017-09-30 MED ORDER — CEPHALEXIN 500 MG PO CAPS: 500.0000 mg | ORAL_CAPSULE | Freq: Two times a day (BID) | ORAL | 0 refills | Status: AC

## 2017-09-30 MED ORDER — FENTANYL 12 MCG/HR TD PT72
12.5000 ug | MEDICATED_PATCH | TRANSDERMAL | Status: DC
  Administered 2017-09-30: 12.5 ug via TRANSDERMAL
  Filled 2017-09-30: qty 1

## 2017-09-30 MED ORDER — ALBUTEROL SULFATE (2.5 MG/3ML) 0.083% IN NEBU: 2.5000 mg | INHALATION_SOLUTION | Freq: Four times a day (QID) | RESPIRATORY_TRACT | Status: DC | PRN

## 2017-09-30 MED ORDER — FLUCONAZOLE 200 MG PO TABS: 200.0000 mg | ORAL_TABLET | Freq: Every day | ORAL | 0 refills | Status: AC

## 2017-09-30 MED ORDER — FLUCONAZOLE 100 MG PO TABS
200.0000 mg | ORAL_TABLET | Freq: Every day | ORAL | Status: DC
  Administered 2017-09-30: 200 mg via ORAL
  Filled 2017-09-30: qty 2

## 2017-09-30 NOTE — Discharge Summary (Signed)
[facility] NAME: Kerri Rangel    MR#:  [id number]  DATE OF BIRTH:  August 28, 1923  DATE OF ADMISSION:  09/25/2017 ADMITTING PHYSICIAN: Amelia Jo, MD  DATE OF DISCHARGE:  09/30/17  PRIMARY CARE PHYSICIAN: Leonel Ramsay, MD    ADMISSION DIAGNOSIS:  Closed fracture of left hip, initial encounter (Kerri Rangel) [S72.002A] Injury of head, initial encounter [S09.90XA]  DISCHARGE DIAGNOSIS:  Active Problems:   Closed left hip fracture ([place]) UTI   SECONDARY DIAGNOSIS:   Past Medical History:  Diagnosis Date  . Anemia   . Anxiety 08/07/2016  . Cancer ([place])    colon ca  . Common bile duct (CBD) obstruction 08/01/2016   Overview:  Possible stone noted 07/26/16 - refer to GI. LFTs nml but does have chronic abd pain   . Dementia   . Depression 08/07/2016  . Dizzy 03/02/2014  . Edema 03/02/2014  . Gallstone 08/01/2016  . GERD (gastroesophageal reflux disease)   . Heart disease   . Hyperlipidemia, unspecified 08/07/2016  . Hypertension 08/07/2016  . Hypothyroidism 08/07/2016  . Late onset Alzheimer's disease with behavioral disturbance 03/02/2015  . Osteoporosis   . Vitamin D deficiency     HOSPITAL COURSE:   HISTORY OF PRESENT ILLNESS: Kerri Rangel  is a 84 y.o. female with a known history of advanced dementia, hypertension and hypothyroidism.  Patient is a poor historian due to her dementia.  Most of the information was obtained from reviewing the medical records and from discussion with the family.  Patient was admitted to emergency room after a mechanical fall.  She was going out to [employer] for dinner with her family and she lost her balance outside on the pavement in front of the restaurant.  She fell on her left side and hurt her head and her left hip.  Per family, patient has been slowly deconditioning in the past 6-12 months.  She spends most of her time laying in the bed and when she walks her balance is poor.  She  uses a walker at home. Upon evaluation in the emergency room, left hip x-ray showed displaced left subtrochanteric fracture.  CAT scan of the brain showed scalp hematoma, but no intracranial hemorrhage or bone fracture.  Patient is admitted for further evaluation and treatment.  1. Fever from urinary tract infection.abnormal urinalysis, urine cultures are pending.  Urine is showing budding yeast catheter removed, while awaiting for final sensitivity results patient is started on Keflex and Diflucan.  PCP at the rehab center to follow-up on the final results and adjust the antibiotics as needed.  Chest x-ray with atelectasis.    Incentive spirometry  2. Acute blood loss anemia.  Hemoglobin responded to blood transfusion.  Hemoglobin at 8.5  3. Left hip fracture status post operative repair.  Pain control.  Physical therapy evaluation appreciated. SNF ,asa for prophylaxis  4. Dementia.  Continue psychiatric medications 5. Hypokalemia replaced during hospital course 6. Hypertension on atenolol 7. Chronic pain on Duragesic patch 8. Hypothyroidism unspecified on levothyroxine     DISCHARGE CONDITIONS:   FAIR  CONSULTS OBTAINED:     PROCEDURES left hip intertrochanteric intramedullary nail placement  DRUG ALLERGIES:  No Known Allergies  DISCHARGE MEDICATIONS:   Allergies as of 09/30/2017   No Known Allergies     Medication List    TAKE these medications   acetaminophen 325 MG tablet Commonly known as:  TYLENOL Take 2 tablets (650 mg total) by mouth  every 6 (six) hours as needed for mild pain (or Fever >/= 101).   albuterol (2.5 MG/3ML) 0.083% nebulizer solution Commonly known as:  PROVENTIL Take 3 mLs (2.5 mg total) by nebulization every 6 (six) hours as needed for wheezing or shortness of breath.   aspirin 81 MG EC tablet Take 1 tablet (81 mg total) by mouth daily.   atenolol 50 MG tablet Commonly known as:  TENORMIN Take 50 mg by mouth daily.   cephALEXin 500 MG  capsule Commonly known as:  KEFLEX Take 1 capsule (500 mg total) by mouth every 12 (twelve) hours for 14 doses.   docusate sodium 100 MG capsule Commonly known as:  COLACE Take 1 capsule (100 mg total) by mouth 2 (two) times daily.   donepezil 5 MG tablet Commonly known as:  ARICEPT Take 5 mg by mouth daily at 12 noon.   fentaNYL 12 MCG/HR Commonly known as:  DURAGESIC - dosed mcg/hr Place 1 patch (12.5 mcg total) onto the skin every 3 (three) days. What changed:  when to take this   fluconazole 200 MG tablet Commonly known as:  DIFLUCAN Take 1 tablet (200 mg total) by mouth daily for 14 days.   HYDROcodone-acetaminophen 10-325 MG tablet Commonly known as:  NORCO Take 1 tablet by mouth every 6 (six) hours as needed for severe pain. What changed:    when to take this  reasons to take this   levothyroxine 25 MCG tablet Commonly known as:  SYNTHROID, LEVOTHROID Take 25 mcg by mouth daily before breakfast.   Melatonin 5 MG Tabs Take 5 mg by mouth at bedtime as needed (sleep).   mirtazapine 30 MG tablet Commonly known as:  REMERON Take 30 mg by mouth at bedtime.   pantoprazole 40 MG tablet Commonly known as:  PROTONIX Take 40 mg by mouth daily.   RA VITAMIN B-12 TR 1000 MCG Tbcr Generic drug:  Cyanocobalamin Take 1,000 mcg by mouth daily.   sertraline 25 MG tablet Commonly known as:  ZOLOFT Take 25 mg by mouth daily.   traZODone 50 MG tablet Commonly known as:  DESYREL Take 0.5 tablets (25 mg total) by mouth at bedtime as needed for sleep.        DISCHARGE INSTRUCTIONS:   Follow-up with primary care physician at the facility in 3-5 days.  PCP to follow-up on the your final urine sensitivity results and monitor hemoglobin results Follow-up with Dr. Harlow Mares in 10-14 days  DIET:  Low salt  DISCHARGE CONDITION:  Fair  ACTIVITY:  Activity as tolerated per PT  OXYGEN:  Home Oxygen: No.   Oxygen Delivery: room air  DISCHARGE LOCATION:  nursing home    If you experience worsening of your admission symptoms, develop shortness of breath, life threatening emergency, suicidal or homicidal thoughts you must seek medical attention immediately by calling 911 or calling your MD immediately  if symptoms less severe.  You Must read complete instructions/literature along with all the possible adverse reactions/side effects for all the Medicines you take and that have been prescribed to you. Take any new Medicines after you have completely understood and accpet all the possible adverse reactions/side effects.   Please note  You were cared for by a hospitalist during your hospital stay. If you have any questions about your discharge medications or the care you received while you were in the hospital after you are discharged, you can call the unit and asked to speak with the hospitalist on call if the hospitalist that  took care of you is not available. Once you are discharged, your primary care physician will handle any further medical issues. Please note that NO REFILLS for any discharge medications will be authorized once you are discharged, as it is imperative that you return to your primary care physician (or establish a relationship with a primary care physician if you do not have one) for your aftercare needs so that they can reassess your need for medications and monitor your lab values.     Today  Chief Complaint  Patient presents with  . Fall   Patient is doing much better today no fever overnight.  Family members at bedside.  Okay to discharge patient.  Patient is demented  ROS: Patient is demented with limited review of systems CONSTITUTIONAL: Denies fevers, chills. Has any fatigue, weakness.  RESPIRATORY: Denies cough, wheeze, shortness of breath.  CARDIOVASCULAR: Denies chest pain, palpitations, edema.  GASTROINTESTINAL: Denies nausea, vomiting, diarrhea, abdominal pain. Denies bright red blood per rectum. HEMATOLOGIC AND LYMPHATIC:  Denies easy bruising or bleeding. SKIN: Denies rash or lesion. MUSCULOSKELETAL: Left hip pain  nEUROLOGIC: Denies paralysis, paresthesias.  PSYCHIATRIC: Denies anxiety or depressive symptoms.   VITAL SIGNS:  Blood pressure (!) 150/50, pulse 78, temperature 99 F (37.2 C), temperature source Oral, resp. rate 20, height 5' 2.5" (1.588 m), weight 78.4 kg (172 lb 12 oz), SpO2 93 %.  I/O:    Intake/Output Summary (Last 24 hours) at 09/30/2017 1011 Last data filed at 09/30/2017 0400 Gross per 24 hour  Intake 750 ml  Output 1100 ml  Net -350 ml    PHYSICAL EXAMINATION:  GENERAL:  84 y.o.-year-old patient lying in the bed with no acute distress.  EYES: Pupils equal, round, reactive to light and accommodation. No scleral icterus. Extraocular muscles intact.  HEENT: Head atraumatic, normocephalic. Oropharynx and nasopharynx clear.  NECK:  Supple, no jugular venous distention. No thyroid enlargement, no tenderness.  LUNGS: Normal breath sounds bilaterally, no wheezing, rales,rhonchi or crepitation. No use of accessory muscles of respiration.  CARDIOVASCULAR: S1, S2 normal. No murmurs, rubs, or gallops.  ABDOMEN: Soft, non-tender, non-distended. Bowel sounds present. No organomegaly or mass.  EXTREMITIES: Left hip status post surgery with honeycomb dressing which is clean no pedal edema, cyanosis, or clubbing.  NEUROLOGIC: Patient is awake and alert and oriented x2  Gait not checked.  PSYCHIATRIC: The patient is alert and oriented x 2.  SKIN: No obvious rash, lesion, or ulcer.   DATA REVIEW:   CBC Recent Labs  Lab 09/29/17 0421  WBC 9.4  HGB 8.5*  HCT 24.9*  PLT 178    Chemistries  Recent Labs  Lab 09/29/17 0421  NA 144  K 3.6  CL 113*  CO2 21*  GLUCOSE 115*  BUN 17  CREATININE 0.92  CALCIUM 7.8*  AST 26  ALT 6*  ALKPHOS 70  BILITOT 0.8    Cardiac Enzymes No results for input(s): TROPONINI in the last 168 hours.  Microbiology Results  Results for orders placed or  performed during the hospital encounter of 09/25/17  Surgical PCR screen     Status: Abnormal   Collection Time: 09/26/17  3:57 AM  Result Value Ref Range Status   MRSA, PCR NEGATIVE NEGATIVE Final   Staphylococcus aureus POSITIVE (A) NEGATIVE Final    Comment: (NOTE) The Xpert SA Assay (FDA approved for NASAL specimens in patients 64 years of age and older), is one component of a comprehensive surveillance program. It is not intended to diagnose infection nor  to guide or monitor treatment. Performed at [facility], [address] [place], [place] [address]     RADIOLOGY:  Dg Chest Port 1 View  Result Date: 09/29/2017 CLINICAL DATA:  Fever today, 3 days post left hip IM nail. Hx of colon cancer 1990's, HTN, breast lumpectomy 1971, non-smoker. EXAM: PORTABLE CHEST 1 VIEW COMPARISON:  09/25/2017 FINDINGS: There is elevation of the right diaphragm unchanged from the prior exam. There is hazy right basilar airspace disease likely reflecting atelectasis. There is no other focal parenchymal opacity. There is no pleural effusion or pneumothorax. The heart and mediastinal contours are unremarkable. The osseous structures are unremarkable. IMPRESSION: Mild right basilar atelectasis. Otherwise no acute cardiopulmonary disease. Electronically Signed   By: Kathreen Devoid   On: 09/29/2017 16:58   Dg Hip Operative Unilat W Or W/o Pelvis Left  Result Date: 09/26/2017 CLINICAL DATA:  Surgery EXAM: OPERATIVE RIGHT HIP (WITH PELVIS IF PERFORMED) 21 VIEWS TECHNIQUE: Fluoroscopic spot image(s) were submitted for interpretation post-operatively. COMPARISON:  Plain film of the left hip performed on 09/25/2017. FINDINGS: Intraoperative fluoroscopic spot images show placement of an intramedullary rod within the proximal right femur and dynamic compression screw traversing the right femoral neck. Hardware appears intact and appropriately positioned. Distal locking screws appear intact and appropriately  positioned. Fluoroscopy was provided for 1 minute 17 seconds. IMPRESSION: Intraoperative fluoroscopic spot images showing placement of intramedullary rod and dynamic compression screw traversing the right femoral neck. No evidence of surgical complicating feature. Electronically Signed   By: Franki Cabot M.D.   On: 09/26/2017 19:31    EKG:   Orders placed or performed during the hospital encounter of 09/25/17  . ED EKG  . ED EKG      Management plans discussed with the patient, family and they are in agreement.  CODE STATUS:     Code Status Orders  (From admission, onward)        Start     Ordered   09/25/17 2318  Full code  Continuous     09/25/17 2317    Code Status History    Date Active Date Inactive Code Status Order ID Comments User Context   This patient has a current code status but no historical code status.    Advance Directive Documentation     Most Recent Value  Type of Advance Directive  Healthcare Power of Attorney, Living will  Pre-existing out of facility DNR order (yellow form or pink MOST form)  No data  "MOST" Form in Place?  No data      TOTAL TIME TAKING CARE OF THIS PATIENT:  45 minutes.   Note: This dictation was prepared with Dragon dictation along with smaller phrase technology. Any transcriptional errors that result from this process are unintentional.   @[facility] @  on 09/30/2017 at 10:11 AM  Between 7am to 6pm - Pager - [phone]  After 6pm go to [email or web address] - password [email or web address]  [facility]  Office  [phone]  CC: Primary care physician; Leonel Ramsay, MD

## 2017-09-30 NOTE — Progress Notes (Signed)
EMS her to get patient. VS checked, Oral temp 100.0. MD Gouru paged and made aware. MD ok with patient leaving.  IV removed.

## 2017-09-30 NOTE — Plan of Care (Signed)
  Clinical Measurements: Ability to maintain clinical measurements within normal limits will improve 09/30/2017 0438 - Progressing by Haskel Dewalt, Lucille Passy, RN Will remain free from infection 09/30/2017 [phone] - Progressing by Jema Deegan, Lucille Passy, RN Diagnostic test results will improve 09/30/2017 [phone] - Progressing by Khylah Kendra, Lucille Passy, RN Respiratory complications will improve 09/30/2017 0438 - Progressing by Bryna Colander, RN Cardiovascular complication will be avoided 09/30/2017 0438 - Progressing by Cathyann Kilfoyle, Lucille Passy, RN   Activity: Risk for activity intolerance will decrease 09/30/2017 0438 - Progressing by Bryna Colander, RN   Nutrition: Adequate nutrition will be maintained 09/30/2017 0438 - Progressing by Uva Runkel, Lucille Passy, RN

## 2017-09-30 NOTE — Progress Notes (Signed)
Report called to [facility]. Report taken by Brittney.  EMS called for transportation.

## 2017-09-30 NOTE — Progress Notes (Signed)
Physical Therapy Treatment Patient Details Name: LEQUITA MEADOWCROFT MRN: [id number] DOB: [birth year] Today's Date: 09/30/2017    History of Present Illness Pt. admitted for L hip fracture; is now S/p L IM nailing on 09/26/17. Pt.'s PMH is significant for HTN, GERD, depression and advanced dementia. Pt is s/p transfusion over weekend with Hgb at 8.5 this date. Pt currently on RA with sats WNL    PT Comments    Pt is making gradual progress towards goals, however still needs heavy assist for OOB mobility. Pt able to follow commands, however is very weak and fatigues quickly. Needs +2 for most mobility. Pt fearful of standing, tends to stand with post leaning, worked on posture and standing balance this date. Good tolerance for there-ex. Appears to have less pain with supine activities rather than standing. Will continue to progress.   Follow Up Recommendations  SNF     Equipment Recommendations  Rolling walker with 5" wheels    Recommendations for Other Services       Precautions / Restrictions Precautions Precautions: Fall Restrictions Weight Bearing Restrictions: No    Mobility  Bed Mobility Overal bed mobility: Needs Assistance Bed Mobility: Supine to Sit     Supine to sit: Mod assist;+2 for physical assistance     General bed mobility comments: able to initiate sliding B LE off bed although grimaces in pain with movement. Pt able to grab bed rails with B UE however has limited ability to use for trunk support and scooting towards EOB. Needs +2 for assistance, however pt able to complete movement with less physical assist than previous sessions.  Transfers Overall transfer level: Needs assistance Equipment used: Rolling walker (2 wheeled) Transfers: Sit to/from Stand Sit to Stand: Max assist;+2 physical assistance         General transfer comment: 2 attempts for standing as first attempt, pt loses balance in post direction and required seated break. Once standing,  therapist focused on postural correction and hand placement on RW. Pt fatigues quickly, only able to tolerate standing for 1 min. Returned back to bed.  Ambulation/Gait Ambulation/Gait assistance: Max assist;+2 physical assistance;+2 safety/equipment Ambulation Distance (Feet): 2 Feet Assistive device: Rolling walker (2 wheeled) Gait Pattern/deviations: Step-to pattern;Decreased weight shift to right;Trunk flexed     General Gait Details: side stepping performed x 2 ft' however pt fatigues quickly and required seated rest break before reaching recliner. Pt then performed SPT to recliner with max assist +2.   Stairs            Wheelchair Mobility    Modified Rankin (Stroke Patients Only)       Balance                                            Cognition Arousal/Alertness: Awake/alert Behavior During Therapy: WFL for tasks assessed/performed Overall Cognitive Status: History of cognitive impairments - at baseline                                        Exercises Other Exercises Other Exercises: Supine ther-ex, however pt fatigues quickly. Exercises including L LE ankle pumps, SLR, hip abd/add, and LAQ. All ther-ex performed x 15 reps with min/mod assist due to pain.    General Comments  Pertinent Vitals/Pain Pain Assessment: 0-10 Pain Score: 6  Pain Location: L hip Pain Descriptors / Indicators: Grimacing Pain Intervention(s): Limited activity within patient's tolerance    Home Living                      Prior Function            PT Goals (current goals can now be found in the care plan section) Acute Rehab PT Goals Patient Stated Goal: unable to state PT Goal Formulation: Patient unable to participate in goal setting Time For Goal Achievement: 10/11/17 Potential to Achieve Goals: Fair Progress towards PT goals: Progressing toward goals    Frequency    BID      PT Plan Current plan remains  appropriate    Co-evaluation              AM-PAC PT "6 Clicks" Daily Activity  Outcome Measure  Difficulty turning over in bed (including adjusting bedclothes, sheets and blankets)?: Unable Difficulty moving from lying on back to sitting on the side of the bed? : A Lot Difficulty sitting down on and standing up from a chair with arms (e.g., wheelchair, bedside commode, etc,.)?: A Lot Help needed moving to and from a bed to chair (including a wheelchair)?: A Lot Help needed walking in hospital room?: A Lot Help needed climbing 3-5 steps with a railing? : Total 6 Click Score: 10    End of Session Equipment Utilized During Treatment: Gait belt Activity Tolerance: Patient limited by fatigue Patient left: in chair;with call bell/phone within reach;with chair alarm set;with family/visitor present Nurse Communication: Mobility status PT Visit Diagnosis: Unsteadiness on feet (R26.81);Muscle weakness (generalized) (M62.81);History of falling (Z91.81);Difficulty in walking, not elsewhere classified (R26.2) Pain - Right/Left: Left Pain - part of body: Hip     Time: 4128-7867 PT Time Calculation (min) (ACUTE ONLY): 28 min  Charges:  $Therapeutic Exercise: 8-22 mins $Therapeutic Activity: 8-22 mins                    G Codes:       Greggory Stallion, PT, DPT [phone]    Camala Talwar 09/30/2017, 11:32 AM

## 2017-09-30 NOTE — Progress Notes (Signed)
Patient is medically stable for D/C to [facility] today. [facility] authorization has been received. Per [facility] liaison patient can come today to room 712. RN will call report to [place] at [phone] and arrange EMS for transport. Clinical Education officer, museum (CSW) sent D/C orders to [facility] via [facility]. Patient is aware of above. Patient's son Richardson Landry and daughter [name] are aware of above. Please reconsult if future social work needs arise. CSW signing off.   [employer], LCSW [phone]

## 2017-09-30 NOTE — Clinical Social Work Placement (Signed)
   CLINICAL SOCIAL WORK PLACEMENT  NOTE  Date:  09/30/2017  Patient Details  Name: Kerri Rangel MRN: [id number] Date of Birth: 1923/12/25  Clinical Social Work is seeking post-discharge placement for this patient at the [place] level of care (*CSW will initial, date and re-position this form in  chart as items are completed):  Yes   Patient/family provided with [place] Work Department's list of facilities offering this level of care within the geographic area requested by the patient (or if unable, by the patient's family).  Yes   Patient/family informed of their freedom to choose among providers that offer the needed level of care, that participate in Medicare, Medicaid or managed care program needed by the patient, have an available bed and are willing to accept the patient.  Yes   Patient/family informed of [facility]'s ownership interest in [facility] and [facility], as well as of the fact that they are under no obligation to receive care at these facilities.  PASRR submitted to EDS on 09/26/17     PASRR number received on 09/26/17     Existing PASRR number confirmed on       FL2 transmitted to all facilities in geographic area requested by pt/family on 09/26/17     FL2 transmitted to all facilities within larger geographic area on       Patient informed that his/her managed care company has contracts with or will negotiate with certain facilities, including the following:        Yes   Patient/family informed of bed offers received.  Patient chooses bed at ([facility] )     Physician recommends and patient chooses bed at      Patient to be transferred to ([facility] ) on 09/30/17.  Patient to be transferred to facility by [facility] )     Patient family notified on 09/30/17 of transfer.  Name of family member notified:  (Patient's daughter [name] and son Richardson Landry are aware of D/C today. )     PHYSICIAN       Additional  Comment:    _______________________________________________ Zebediah Beezley, Veronia Beets, LCSW 09/30/2017, 2:09 PM

## 2017-09-30 NOTE — Progress Notes (Signed)
Family member in room asking to verify if patient still has fentanyl patch on cause she seemed to be in more pain than usual . This nurse did not see one on . Another nurse Jennifer,was asked to verify and she also did not see one on. Pharmacy was called and asked if patient could have a new patch.

## 2017-10-01 LAB — URINE CULTURE

## 2017-10-02 DIAGNOSIS — E039 Hypothyroidism, unspecified: Secondary | ICD-10-CM | POA: Diagnosis not present

## 2017-10-02 DIAGNOSIS — M549 Dorsalgia, unspecified: Secondary | ICD-10-CM | POA: Diagnosis not present

## 2017-10-02 DIAGNOSIS — F0390 Unspecified dementia without behavioral disturbance: Secondary | ICD-10-CM | POA: Diagnosis not present

## 2017-10-02 DIAGNOSIS — N390 Urinary tract infection, site not specified: Secondary | ICD-10-CM | POA: Diagnosis not present

## 2017-10-02 DIAGNOSIS — S7292XD Unspecified fracture of left femur, subsequent encounter for closed fracture with routine healing: Secondary | ICD-10-CM | POA: Diagnosis not present

## 2017-10-02 DIAGNOSIS — I10 Essential (primary) hypertension: Secondary | ICD-10-CM | POA: Diagnosis not present

## 2017-10-12 DIAGNOSIS — F0390 Unspecified dementia without behavioral disturbance: Secondary | ICD-10-CM | POA: Diagnosis not present

## 2017-10-12 DIAGNOSIS — I10 Essential (primary) hypertension: Secondary | ICD-10-CM | POA: Diagnosis not present

## 2017-10-12 DIAGNOSIS — M549 Dorsalgia, unspecified: Secondary | ICD-10-CM | POA: Diagnosis not present

## 2017-10-12 DIAGNOSIS — E039 Hypothyroidism, unspecified: Secondary | ICD-10-CM | POA: Diagnosis not present

## 2017-10-12 DIAGNOSIS — N390 Urinary tract infection, site not specified: Secondary | ICD-10-CM | POA: Diagnosis not present

## 2017-10-12 DIAGNOSIS — S7292XD Unspecified fracture of left femur, subsequent encounter for closed fracture with routine healing: Secondary | ICD-10-CM | POA: Diagnosis not present

## 2017-10-20 DIAGNOSIS — F3289 Other specified depressive episodes: Secondary | ICD-10-CM | POA: Diagnosis not present

## 2017-10-20 DIAGNOSIS — M6281 Muscle weakness (generalized): Secondary | ICD-10-CM | POA: Diagnosis not present

## 2017-10-20 DIAGNOSIS — G4709 Other insomnia: Secondary | ICD-10-CM | POA: Diagnosis not present

## 2017-10-20 DIAGNOSIS — E039 Hypothyroidism, unspecified: Secondary | ICD-10-CM | POA: Diagnosis not present

## 2017-10-20 DIAGNOSIS — I10 Essential (primary) hypertension: Secondary | ICD-10-CM | POA: Diagnosis not present

## 2017-10-20 DIAGNOSIS — K219 Gastro-esophageal reflux disease without esophagitis: Secondary | ICD-10-CM | POA: Diagnosis not present

## 2017-10-20 DIAGNOSIS — N390 Urinary tract infection, site not specified: Secondary | ICD-10-CM | POA: Diagnosis not present

## 2017-10-20 DIAGNOSIS — B379 Candidiasis, unspecified: Secondary | ICD-10-CM | POA: Diagnosis not present

## 2017-10-20 DIAGNOSIS — D510 Vitamin B12 deficiency anemia due to intrinsic factor deficiency: Secondary | ICD-10-CM | POA: Diagnosis not present

## 2017-10-21 DIAGNOSIS — F0390 Unspecified dementia without behavioral disturbance: Secondary | ICD-10-CM | POA: Diagnosis not present

## 2017-10-21 DIAGNOSIS — S7292XD Unspecified fracture of left femur, subsequent encounter for closed fracture with routine healing: Secondary | ICD-10-CM | POA: Diagnosis not present

## 2017-10-21 DIAGNOSIS — N390 Urinary tract infection, site not specified: Secondary | ICD-10-CM | POA: Diagnosis not present

## 2017-10-21 DIAGNOSIS — M549 Dorsalgia, unspecified: Secondary | ICD-10-CM | POA: Diagnosis not present

## 2017-10-21 DIAGNOSIS — I10 Essential (primary) hypertension: Secondary | ICD-10-CM | POA: Diagnosis not present

## 2017-10-21 DIAGNOSIS — E039 Hypothyroidism, unspecified: Secondary | ICD-10-CM | POA: Diagnosis not present

## 2017-10-25 ENCOUNTER — Other Ambulatory Visit: Payer: Self-pay | Admitting: Licensed Clinical Social Worker

## 2017-10-25 DIAGNOSIS — K219 Gastro-esophageal reflux disease without esophagitis: Secondary | ICD-10-CM | POA: Diagnosis not present

## 2017-10-25 DIAGNOSIS — N390 Urinary tract infection, site not specified: Secondary | ICD-10-CM | POA: Diagnosis not present

## 2017-10-25 DIAGNOSIS — G4709 Other insomnia: Secondary | ICD-10-CM | POA: Diagnosis not present

## 2017-10-25 DIAGNOSIS — E039 Hypothyroidism, unspecified: Secondary | ICD-10-CM | POA: Diagnosis not present

## 2017-10-25 DIAGNOSIS — M6281 Muscle weakness (generalized): Secondary | ICD-10-CM | POA: Diagnosis not present

## 2017-10-25 DIAGNOSIS — D510 Vitamin B12 deficiency anemia due to intrinsic factor deficiency: Secondary | ICD-10-CM | POA: Diagnosis not present

## 2017-10-25 DIAGNOSIS — F3289 Other specified depressive episodes: Secondary | ICD-10-CM | POA: Diagnosis not present

## 2017-10-25 DIAGNOSIS — B379 Candidiasis, unspecified: Secondary | ICD-10-CM | POA: Diagnosis not present

## 2017-10-25 DIAGNOSIS — I10 Essential (primary) hypertension: Secondary | ICD-10-CM | POA: Diagnosis not present

## 2017-10-25 NOTE — Patient Outreach (Signed)
[facility]) Care Management  10/25/2017  Kerri Rangel Kerri Rangel [id number]  Assessment- This CSW is currently providing coverage for [facility]. Patient is currently at [facility]. CSW completed call to discharge planner Queen Blossom but was unable to reach her. CSW left a voice message requesting a return call with discharge updates once available. CSW will await for return call.   [name], BSW, MSW, [email or web address] Phone: [phone] Fax: [phone]

## 2017-10-30 DIAGNOSIS — F0281 Dementia in other diseases classified elsewhere with behavioral disturbance: Secondary | ICD-10-CM | POA: Diagnosis not present

## 2017-10-30 DIAGNOSIS — G8929 Other chronic pain: Secondary | ICD-10-CM | POA: Diagnosis not present

## 2017-10-30 DIAGNOSIS — Z8781 Personal history of (healed) traumatic fracture: Secondary | ICD-10-CM | POA: Diagnosis not present

## 2017-10-30 DIAGNOSIS — M545 Low back pain: Secondary | ICD-10-CM | POA: Diagnosis not present

## 2017-10-30 DIAGNOSIS — G301 Alzheimer's disease with late onset: Secondary | ICD-10-CM | POA: Diagnosis not present

## 2017-10-31 ENCOUNTER — Other Ambulatory Visit: Payer: Self-pay | Admitting: *Deleted

## 2017-10-31 NOTE — Patient Outreach (Signed)
[facility]) Care Management  10/31/2017  GERILYN STARGELL 27-Jun-1924 [id number]   CSW called & spoke with Queen Blossom (social worker at [facility]) ph#: [phone] who confirmed that patient was discharged to [facility] on 3/5 where she plans to stay for long term care.   CSW will perform a case closure on patient, CSW will fax an update to patient's Primary Care Physician, Dr. Adrian Prows to ensure that they are aware of CSW's involvement with patient's plan of care. CSW will submit a case closure request to Verlon Setting, Care Management Assistant with [facility] in the form of an inbasket message.    Raynaldo Opitz, LCSW [facility]  Clinical Social Worker cell #: [phone]

## 2017-11-01 DIAGNOSIS — M6281 Muscle weakness (generalized): Secondary | ICD-10-CM | POA: Diagnosis not present

## 2017-11-01 DIAGNOSIS — R2689 Other abnormalities of gait and mobility: Secondary | ICD-10-CM | POA: Diagnosis not present

## 2017-11-05 DIAGNOSIS — S72145A Nondisplaced intertrochanteric fracture of left femur, initial encounter for closed fracture: Secondary | ICD-10-CM | POA: Diagnosis not present

## 2017-11-08 ENCOUNTER — Encounter (INDEPENDENT_AMBULATORY_CARE_PROVIDER_SITE_OTHER): Payer: Self-pay | Admitting: Vascular Surgery

## 2017-11-08 ENCOUNTER — Encounter (INDEPENDENT_AMBULATORY_CARE_PROVIDER_SITE_OTHER): Payer: PPO

## 2017-11-08 ENCOUNTER — Ambulatory Visit (INDEPENDENT_AMBULATORY_CARE_PROVIDER_SITE_OTHER): Payer: PPO

## 2017-11-08 ENCOUNTER — Ambulatory Visit (INDEPENDENT_AMBULATORY_CARE_PROVIDER_SITE_OTHER): Payer: PPO | Admitting: Vascular Surgery

## 2017-11-08 VITALS — BP 134/60 | HR 62 | Resp 16 | Wt 160.0 lb

## 2017-11-08 DIAGNOSIS — I739 Peripheral vascular disease, unspecified: Principal | ICD-10-CM

## 2017-11-08 DIAGNOSIS — I159 Secondary hypertension, unspecified: Secondary | ICD-10-CM

## 2017-11-08 DIAGNOSIS — I6523 Occlusion and stenosis of bilateral carotid arteries: Secondary | ICD-10-CM | POA: Diagnosis not present

## 2017-11-08 DIAGNOSIS — I779 Disorder of arteries and arterioles, unspecified: Secondary | ICD-10-CM

## 2017-11-08 DIAGNOSIS — L89609 Pressure ulcer of unspecified heel, unspecified stage: Secondary | ICD-10-CM | POA: Insufficient documentation

## 2017-11-08 DIAGNOSIS — E785 Hyperlipidemia, unspecified: Secondary | ICD-10-CM

## 2017-11-08 DIAGNOSIS — L89629 Pressure ulcer of left heel, unspecified stage: Secondary | ICD-10-CM | POA: Diagnosis not present

## 2017-11-08 NOTE — Assessment & Plan Note (Signed)
blood pressure control important in reducing the progression of atherosclerotic disease. On appropriate oral medications.  

## 2017-11-08 NOTE — Assessment & Plan Note (Signed)
The patient has a nonhealing ulcer on her left heel.  Given her atherosclerotic disease, peripheral arterial disease could certainly be present.  Her pulses are not easily palpable.  I would recommend noninvasive studies to be performed in the near future at her convenience.  It was discussed with the daughter that this could be a critical and limb threatening situation.

## 2017-11-08 NOTE — Patient Instructions (Signed)
Peripheral Vascular Disease Peripheral vascular disease (PVD) is a disease of the blood vessels that are not part of your heart and brain. A simple term for PVD is poor circulation. In most cases, PVD narrows the blood vessels that carry blood from your heart to the rest of your body. This can result in a decreased supply of blood to your arms, legs, and internal organs, like your stomach or kidneys. However, it most often affects a person's lower legs and feet. There are two types of PVD.  Organic PVD. This is the more common type. It is caused by damage to the structure of blood vessels.  Functional PVD. This is caused by conditions that make blood vessels contract and tighten (spasm).  Without treatment, PVD tends to get worse over time. PVD can also lead to acute ischemic limb. This is when an arm or limb suddenly has trouble getting enough blood. This is a medical emergency. What are the causes? Each type of PVD has many different causes. The most common cause of PVD is buildup of a fatty material (plaque) inside of your arteries (atherosclerosis). Small amounts of plaque can break off from the walls of the blood vessels and become lodged in a smaller artery. This blocks blood flow and can cause acute ischemic limb. Other common causes of PVD include:  Blood clots that form inside of blood vessels.  Injuries to blood vessels.  Diseases that cause inflammation of blood vessels or cause blood vessel spasms.  Health behaviors and health history that increase your risk of developing PVD.  What increases the risk? You may have a greater risk of PVD if you:  Have a family history of PVD.  Have certain medical conditions, including: ? High cholesterol. ? Diabetes. ? High blood pressure (hypertension). ? Coronary heart disease. ? Past problems with blood clots. ? Past injury, such as burns or a broken bone. These may have damaged blood vessels in your limbs. ? Buerger disease. This is  caused by inflamed blood vessels in your hands and feet. ? Some forms of arthritis. ? Rare birth defects that affect the arteries in your legs.  Use tobacco.  Do not get enough exercise.  Are obese.  Are age 50 or older.  What are the signs or symptoms? PVD may cause many different symptoms. Your symptoms depend on what part of your body is not getting enough blood. Some common signs and symptoms include:  Cramps in your lower legs. This may be a symptom of poor leg circulation (claudication).  Pain and weakness in your legs while you are physically active that goes away when you rest (intermittent claudication).  Leg pain when at rest.  Leg numbness, tingling, or weakness.  Coldness in a leg or foot, especially when compared with the other leg.  Skin or hair changes. These can include: ? Hair loss. ? Shiny skin. ? Pale or bluish skin. ? Thick toenails.  Inability to get or maintain an erection (erectile dysfunction).  People with PVD are more prone to developing ulcers and sores on their toes, feet, or legs. These may take longer than normal to heal. How is this diagnosed? Your health care provider may diagnose PVD from your signs and symptoms. The health care provider will also do a physical exam. You may have tests to find out what is causing your PVD and determine its severity. Tests may include:  Blood pressure recordings from your arms and legs and measurements of the strength of your pulses (  pulse volume recordings).  Imaging studies using sound waves to take pictures of the blood flow through your blood vessels (Doppler ultrasound).  Injecting a dye into your blood vessels before having imaging studies using: ? X-rays (angiogram or arteriogram). ? Computer-generated X-rays (CT angiogram). ? A powerful electromagnetic field and a computer (magnetic resonance angiogram or MRA).  How is this treated? Treatment for PVD depends on the cause of your condition and the  severity of your symptoms. It also depends on your age. Underlying causes need to be treated and controlled. These include long-lasting (chronic) conditions, such as diabetes, high cholesterol, and high blood pressure. You may need to first try making lifestyle changes and taking medicines. Surgery may be needed if these do not work. Lifestyle changes may include:  Quitting smoking.  Exercising regularly.  Following a low-fat, low-cholesterol diet.  Medicines may include:  Blood thinners to prevent blood clots.  Medicines to improve blood flow.  Medicines to improve your blood cholesterol levels.  Surgical procedures may include:  A procedure that uses an inflated balloon to open a blocked artery and improve blood flow (angioplasty).  A procedure to put in a tube (stent) to keep a blocked artery open (stent implant).  Surgery to reroute blood flow around a blocked artery (peripheral bypass surgery).  Surgery to remove dead tissue from an infected wound on the affected limb.  Amputation. This is surgical removal of the affected limb. This may be necessary in cases of acute ischemic limb that are not improved through medical or surgical treatments.  Follow these instructions at home:  Take medicines only as directed by your health care provider.  Do not use any tobacco products, including cigarettes, chewing tobacco, or electronic cigarettes. If you need help quitting, ask your health care provider.  Lose weight if you are overweight, and maintain a healthy weight as directed by your health care provider.  Eat a diet that is low in fat and cholesterol. If you need help, ask your health care provider.  Exercise regularly. Ask your health care provider to suggest some good activities for you.  Use compression stockings or other mechanical devices as directed by your health care provider.  Take good care of your feet. ? Wear comfortable shoes that fit well. ? Check your feet  often for any cuts or sores. Contact a health care provider if:  You have cramps in your legs while walking.  You have leg pain when you are at rest.  You have coldness in a leg or foot.  Your skin changes.  You have erectile dysfunction.  You have cuts or sores on your feet that are not healing. Get help right away if:  Your arm or leg turns cold and blue.  Your arms or legs become red, warm, swollen, painful, or numb.  You have chest pain or trouble breathing.  You suddenly have weakness in your face, arm, or leg.  You become very confused or lose the ability to speak.  You suddenly have a very bad headache or lose your vision. This information is not intended to replace advice given to you by your health care provider. Make sure you discuss any questions you have with your health care provider. Document Released: 09/20/2004 Document Revised: 01/19/2016 Document Reviewed: 01/21/2014 Elsevier Interactive Patient Education  2017 [employer]  

## 2017-11-08 NOTE — Assessment & Plan Note (Signed)
Carotid duplex today reveals reasonably stable 40-59% right ICA stenosis and 1-39% left ICA stenosis without significant progression from her previous study. Continue aspirin therapy.  No role for intervention at this time.  Recheck in 1 year

## 2017-11-08 NOTE — Assessment & Plan Note (Signed)
lipid control important in reducing the progression of atherosclerotic disease.   

## 2017-11-08 NOTE — Progress Notes (Signed)
MRN : [id number]  Kerri Rangel is a 84 y.o. ([birth year]) female who presents with chief complaint of  Chief Complaint  Patient presents with  . Carotid    69yr follow up  .  History of Present Illness: Patient returns in follow-up.  Her daughter provides much of the history as she is pretty minimally verbal from her dementia.  She has developed a heel ulceration after being in a facility following a hip fracture.  This is not healing well.  This does not appear overtly painful.  No fever or chills. As for her carotid disease, other than slow progression of her dementia she has not had focal neurologic symptoms. Specifically, the patient denies amaurosis fugax, speech or swallowing difficulties, or arm or leg weakness or numbness.  Carotid duplex today reveals reasonably stable 40-59% right ICA stenosis and 1-39% left ICA stenosis without significant progression from her previous study.  Current Outpatient Medications  Medication Sig Dispense Refill  . acetaminophen (TYLENOL) 325 MG tablet Take 2 tablets (650 mg total) by mouth every 6 (six) hours as needed for mild pain (or Fever >/= 101).    Marland Kitchen albuterol (PROVENTIL) (2.5 MG/3ML) 0.083% nebulizer solution Take 3 mLs (2.5 mg total) by nebulization every 6 (six) hours as needed for wheezing or shortness of breath. 75 mL 12  . aspirin EC 81 MG EC tablet Take 1 tablet (81 mg total) by mouth daily. 30 tablet 0  . atenolol (TENORMIN) 50 MG tablet Take 50 mg by mouth daily.     . cholecalciferol (VITAMIN D) 1000 units tablet Take 1,000 Units by mouth daily.    . Cyanocobalamin (RA VITAMIN B-12 TR) 1000 MCG TBCR Take 1,000 mcg by mouth daily.     Marland Kitchen docusate sodium (COLACE) 100 MG capsule Take 1 capsule (100 mg total) by mouth 2 (two) times daily. 60 capsule 0  . donepezil (ARICEPT) 5 MG tablet Take 5 mg by mouth daily at 12 noon.     . fentaNYL (DURAGESIC - DOSED MCG/HR) 12 MCG/HR Place 1 patch (12.5 mcg total) onto the skin every 3 (three)  days. 10 patch 0  . HYDROcodone-acetaminophen (NORCO) 10-325 MG tablet Take 1 tablet by mouth every 6 (six) hours as needed for severe pain. 30 tablet 0  . levothyroxine (SYNTHROID, LEVOTHROID) 25 MCG tablet Take 25 mcg by mouth daily before breakfast.     . Melatonin 5 MG TABS Take 5 mg by mouth at bedtime as needed (sleep).    . mirtazapine (REMERON) 30 MG tablet Take 30 mg by mouth at bedtime.     . Multiple Vitamins-Minerals (THERA-M) TABS Take by mouth daily.    . pantoprazole (PROTONIX) 40 MG tablet Take 40 mg by mouth daily.    . sertraline (ZOLOFT) 25 MG tablet Take 25 mg by mouth daily.    . traZODone (DESYREL) 50 MG tablet Take 0.5 tablets (25 mg total) by mouth at bedtime as needed for sleep. 30 tablet 0   No current facility-administered medications for this visit.     Past Medical History:  Diagnosis Date  . Anemia   . Anxiety 08/07/2016  . Cancer ([place])    colon ca  . Common bile duct (CBD) obstruction 08/01/2016   Overview:  Possible stone noted 07/26/16 - refer to GI. LFTs nml but does have chronic abd pain   . Dementia   . Depression 08/07/2016  . Dizzy 03/02/2014  . Edema 03/02/2014  . Gallstone 08/01/2016  . GERD (  gastroesophageal reflux disease)   . Heart disease   . Hyperlipidemia, unspecified 08/07/2016  . Hypertension 08/07/2016  . Hypothyroidism 08/07/2016  . Late onset Alzheimer's disease with behavioral disturbance 03/02/2015  . Osteoporosis   . Vitamin D deficiency     Past Surgical History:  Procedure Laterality Date  . ABDOMINAL HYSTERECTOMY    . APPENDECTOMY    . Back sugery  03/2002   lumbar disk and fusion. Dr. Mauri Pole   . BREAST EXCISIONAL BIOPSY     Benign. Dr. Pat Patrick  . BREAST LUMPECTOMY N/A 1971   unsure  . CATARACT EXTRACTION Bilateral   . colon cancer resection  1990s   By Dr. Pat Patrick  . ERCP N/A 08/28/2016   Procedure: ENDOSCOPIC RETROGRADE CHOLANGIOPANCREATOGRAPHY (ERCP);  Surgeon: Lucilla Lame, MD;  Location: [facility] ENDOSCOPY;  Service: Endoscopy;   Laterality: N/A;  . HERNIA REPAIR  1995   ventral  . INTRAMEDULLARY (IM) NAIL INTERTROCHANTERIC Left 09/26/2017   Procedure: INTRAMEDULLARY (IM) NAIL INTERTROCHANTRIC;  Surgeon: Lovell Sheehan, MD;  Location: [facility] ORS;  Service: Orthopedics;  Laterality: Left;  . JOINT REPLACEMENT    . OOPHORECTOMY    . SPINE SURGERY      Social History Social History   Tobacco Use  . Smoking status: Never Smoker  . Smokeless tobacco: Never Used  Substance Use Topics  . Alcohol use: No  . Drug use: No    Family History Family History  Problem Relation Age of Onset  . Diabetes Mother   . Heart attack Mother   . Cancer Sister   . Diabetes Sister   . Alcohol abuse Brother   . Diabetes Brother   . Heart attack Brother   . Stroke Brother     No Known Allergies   REVIEW OF SYSTEMS (Negative unless checked)  Constitutional: [] Weight loss  [] Fever  [] Chills Cardiac: [] Chest pain   [] Chest pressure   [] Palpitations   [] Shortness of breath when laying flat   [] Shortness of breath at rest   [] Shortness of breath with exertion. Vascular:  [] Pain in legs with walking   [] Pain in legs at rest   [] Pain in legs when laying flat   [] Claudication   [] Pain in feet when walking  [] Pain in feet at rest  [] Pain in feet when laying flat   [] History of DVT   [] Phlebitis   [x] Swelling in legs   [] Varicose veins   [x] Non-healing ulcers Pulmonary:   [] Uses home oxygen   [] Productive cough   [] Hemoptysis   [] Wheeze  [] COPD   [] Asthma Neurologic:  [x] Dizziness  [] Blackouts   [] Seizures   [] History of stroke   [] History of TIA  [] Aphasia   [] Temporary blindness   [] Dysphagia   [] Weakness or numbness in arms   [] Weakness or numbness in legs Musculoskeletal:  [x] Arthritis   [] Joint swelling   [x] Joint pain   [] Low back pain Hematologic:  [] Easy bruising  [] Easy bleeding   [] Hypercoagulable state   [] Anemic  [] Hepatitis Gastrointestinal:  [] Blood in stool   [] Vomiting blood  [x] Gastroesophageal reflux/heartburn    [] Difficulty swallowing. Genitourinary:  [] Chronic kidney disease   [] Difficult urination  [] Frequent urination  [] Burning with urination   [] Blood in urine Skin:  [] Rashes   [x] Ulcers   [x] Wounds Psychological:  [] History of anxiety   [x]  History of major depression.  Physical Examination  Vitals:   11/08/17 1534 11/08/17 1535  BP: 130/63 134/60  Pulse: 62   Resp: 16   Weight: 72.6 kg (160 lb)  Body mass index is 28.8 kg/m. Gen:  WD/WN, NAD.  Appears younger than stated age Head: [place]/AT, No temporalis wasting. Ear/Nose/Throat: Hearing grossly intact, nares w/o erythema or drainage, trachea midline Eyes: Conjunctiva clear. Sclera non-icteric Neck: Supple.  No bruit.  Pulmonary:  Good air movement, equal and clear to auscultation bilaterally.  Cardiac: Irregular Vascular:  Vessel Right Left  Radial Palpable Palpable                          PT  1+ palpable  trace palpable  DP  not palpable  not palpable    Musculoskeletal: Using a wheelchair no deformity or atrophy.  Mild lower extremity edema.  Left heel ulceration currently dressed Neurologic: CN 2-12 intact. Sensation grossly intact in extremities.  Symmetrical.  Speech is fluent. Motor exam as listed above. Psychiatric: Judgment and insight are poor.  Fairly advanced dementia Dermatologic: Heel ulceration currently dressed     CBC Lab Results  Component Value Date   WBC 9.4 09/29/2017   HGB 8.5 (L) 09/29/2017   HCT 24.9 (L) 09/29/2017   MCV 85.6 09/29/2017   PLT 178 09/29/2017    BMET    Component Value Date/Time   NA 144 09/29/2017 0421   NA 143 12/23/2014 1348   K 3.6 09/29/2017 0421   K 4.2 12/23/2014 1348   CL 113 (H) 09/29/2017 0421   CL 109 12/23/2014 1348   CO2 21 (L) 09/29/2017 0421   CO2 26 12/23/2014 1348   GLUCOSE 115 (H) 09/29/2017 0421   GLUCOSE 125 (H) 12/23/2014 1348   BUN 17 09/29/2017 0421   BUN 16 12/23/2014 1348   CREATININE 0.92 09/29/2017 0421   CREATININE 0.94 12/23/2014  1348   CALCIUM 7.8 (L) 09/29/2017 0421   CALCIUM 9.1 12/23/2014 1348   GFRNONAA 53 (L) 09/29/2017 0421   GFRNONAA 54 (L) 12/23/2014 1348   GFRAA >60 09/29/2017 0421   GFRAA >60 12/23/2014 1348   CrCl cannot be calculated (Patient's most recent lab result is older than the maximum 21 days allowed.).  COAG No results found for: INR, PROTIME  Radiology No results found.   Assessment/Plan Hypertension blood pressure control important in reducing the progression of atherosclerotic disease. On appropriate oral medications.   Hyperlipidemia, unspecified lipid control important in reducing the progression of atherosclerotic disease.    Carotid artery disease (HCC) Carotid duplex today reveals reasonably stable 40-59% right ICA stenosis and 1-39% left ICA stenosis without significant progression from her previous study. Continue aspirin therapy.  No role for intervention at this time.  Recheck in 1 year  Pressure ulcer, heel The patient has a nonhealing ulcer on her left heel.  Given her atherosclerotic disease, peripheral arterial disease could certainly be present.  Her pulses are not easily palpable.  I would recommend noninvasive studies to be performed in the near future at her convenience.  It was discussed with the daughter that this could be a critical and limb threatening situation.    Leotis Pain, MD  11/08/2017 4:01 PM    This note was created with Dragon medical transcription system.  Any errors from dictation are purely unintentional

## 2017-11-23 DIAGNOSIS — R6889 Other general symptoms and signs: Secondary | ICD-10-CM | POA: Diagnosis not present

## 2017-11-25 DIAGNOSIS — I10 Essential (primary) hypertension: Secondary | ICD-10-CM | POA: Diagnosis not present

## 2017-11-25 DIAGNOSIS — G4700 Insomnia, unspecified: Secondary | ICD-10-CM | POA: Diagnosis not present

## 2017-11-25 DIAGNOSIS — E039 Hypothyroidism, unspecified: Secondary | ICD-10-CM | POA: Diagnosis not present

## 2017-11-25 DIAGNOSIS — D649 Anemia, unspecified: Secondary | ICD-10-CM | POA: Diagnosis not present

## 2017-11-25 DIAGNOSIS — E785 Hyperlipidemia, unspecified: Secondary | ICD-10-CM | POA: Diagnosis not present

## 2017-12-03 ENCOUNTER — Ambulatory Visit (INDEPENDENT_AMBULATORY_CARE_PROVIDER_SITE_OTHER): Payer: PPO | Admitting: Vascular Surgery

## 2017-12-03 ENCOUNTER — Ambulatory Visit (INDEPENDENT_AMBULATORY_CARE_PROVIDER_SITE_OTHER): Payer: PPO

## 2017-12-03 DIAGNOSIS — L89629 Pressure ulcer of left heel, unspecified stage: Secondary | ICD-10-CM

## 2017-12-11 ENCOUNTER — Other Ambulatory Visit: Payer: Self-pay

## 2017-12-11 ENCOUNTER — Emergency Department
Admission: EM | Admit: 2017-12-11 | Discharge: 2017-12-11 | Disposition: A | Discharge status: Home / Self Care | Payer: PPO | Attending: Emergency Medicine | Admitting: Emergency Medicine

## 2017-12-11 ENCOUNTER — Encounter: Payer: Self-pay | Admitting: Emergency Medicine

## 2017-12-11 ENCOUNTER — Emergency Department: Payer: PPO

## 2017-12-11 DIAGNOSIS — Z781 Physical restraint status: Secondary | ICD-10-CM | POA: Diagnosis not present

## 2017-12-11 DIAGNOSIS — Y929 Unspecified place or not applicable: Secondary | ICD-10-CM | POA: Diagnosis not present

## 2017-12-11 DIAGNOSIS — E039 Hypothyroidism, unspecified: Secondary | ICD-10-CM | POA: Diagnosis not present

## 2017-12-11 DIAGNOSIS — Y999 Unspecified external cause status: Secondary | ICD-10-CM | POA: Insufficient documentation

## 2017-12-11 DIAGNOSIS — I2510 Atherosclerotic heart disease of native coronary artery without angina pectoris: Secondary | ICD-10-CM | POA: Diagnosis not present

## 2017-12-11 DIAGNOSIS — Z7401 Bed confinement status: Secondary | ICD-10-CM | POA: Diagnosis not present

## 2017-12-11 DIAGNOSIS — G308 Other Alzheimer's disease: Secondary | ICD-10-CM | POA: Diagnosis not present

## 2017-12-11 DIAGNOSIS — X58XXXA Exposure to other specified factors, initial encounter: Secondary | ICD-10-CM | POA: Diagnosis not present

## 2017-12-11 DIAGNOSIS — Z7982 Long term (current) use of aspirin: Secondary | ICD-10-CM | POA: Insufficient documentation

## 2017-12-11 DIAGNOSIS — S82102A Unspecified fracture of upper end of left tibia, initial encounter for closed fracture: Secondary | ICD-10-CM | POA: Insufficient documentation

## 2017-12-11 DIAGNOSIS — Y939 Activity, unspecified: Secondary | ICD-10-CM | POA: Insufficient documentation

## 2017-12-11 DIAGNOSIS — Z85038 Personal history of other malignant neoplasm of large intestine: Secondary | ICD-10-CM | POA: Insufficient documentation

## 2017-12-11 DIAGNOSIS — I10 Essential (primary) hypertension: Secondary | ICD-10-CM | POA: Insufficient documentation

## 2017-12-11 DIAGNOSIS — M79605 Pain in left leg: Secondary | ICD-10-CM | POA: Diagnosis not present

## 2017-12-11 DIAGNOSIS — F0280 Dementia in other diseases classified elsewhere without behavioral disturbance: Secondary | ICD-10-CM | POA: Diagnosis not present

## 2017-12-11 DIAGNOSIS — Z79899 Other long term (current) drug therapy: Secondary | ICD-10-CM | POA: Insufficient documentation

## 2017-12-11 DIAGNOSIS — S82892A Other fracture of left lower leg, initial encounter for closed fracture: Secondary | ICD-10-CM | POA: Diagnosis not present

## 2017-12-11 DIAGNOSIS — S8992XA Unspecified injury of left lower leg, initial encounter: Secondary | ICD-10-CM | POA: Diagnosis present

## 2017-12-11 DIAGNOSIS — S82252A Displaced comminuted fracture of shaft of left tibia, initial encounter for closed fracture: Secondary | ICD-10-CM | POA: Diagnosis not present

## 2017-12-11 MED ORDER — ACETAMINOPHEN 325 MG PO TABS
650.0000 mg | ORAL_TABLET | Freq: Once | ORAL | Status: AC
  Administered 2017-12-11: 650 mg via ORAL
  Filled 2017-12-11: qty 2

## 2017-12-11 NOTE — ED Provider Notes (Signed)
[facility] Emergency Department Provider Note   ____________________________________________   First MD Initiated Contact with Patient 12/11/17 0149     (approximate)  I have reviewed the triage vital signs and the nursing notes.   HISTORY  Chief Complaint Leg Pain (Left )  Level 5 caveat: History limited by dementia  HPI Kerri Rangel is a 84 y.o. female sent to the ED from [facility] via EMS with a chief complaint of left lower leg pain.  Reportedly nursing home staff reported to EMS that patient's left lower leg looked symmetrical to her right lower leg at 7 AM yesterday.  Patient complains of pain below her left knee.  Left lower extremity appears swollen with patchy ecchymosis.  Patient denies fever, chills, chest pain, shortness of breath, abdominal pain, nausea, vomiting, extremity weakness, numbness or tingling.  Reportedly patient does not ambulate at baseline. ?  Recent trauma or injury.   Past Medical History:  Diagnosis Date  . Anemia   . Anxiety 08/07/2016  . Cancer ([place])    colon ca  . Common bile duct (CBD) obstruction 08/01/2016   Overview:  Possible stone noted 07/26/16 - refer to GI. LFTs nml but does have chronic abd pain   . Dementia   . Depression 08/07/2016  . Dizzy 03/02/2014  . Edema 03/02/2014  . Gallstone 08/01/2016  . GERD (gastroesophageal reflux disease)   . Heart disease   . Hyperlipidemia, unspecified 08/07/2016  . Hypertension 08/07/2016  . Hypothyroidism 08/07/2016  . Late onset Alzheimer's disease with behavioral disturbance 03/02/2015  . Osteoporosis   . Vitamin D deficiency     Patient Active Problem List   Diagnosis Date Noted  . Pressure ulcer, heel 11/08/2017  . Closed left hip fracture ([place]) 09/25/2017  . Carotid artery disease ([place]) 11/06/2016  . Anxiety 08/07/2016  . Depression 08/07/2016  . Hyperlipidemia, unspecified 08/07/2016  . Hypertension 08/07/2016  . Hypothyroidism 08/07/2016  .  Vitamin D deficiency   . Dementia   . Common bile duct (CBD) obstruction 08/01/2016  . Gallstone 08/01/2016  . Late onset Alzheimer's disease with behavioral disturbance 03/02/2015  . Dizzy 03/02/2014  . Edema 03/02/2014    Past Surgical History:  Procedure Laterality Date  . ABDOMINAL HYSTERECTOMY    . APPENDECTOMY    . Back sugery  03/2002   lumbar disk and fusion. Dr. Mauri Pole   . BREAST EXCISIONAL BIOPSY     Benign. Dr. Pat Patrick  . BREAST LUMPECTOMY N/A 1971   unsure  . CATARACT EXTRACTION Bilateral   . colon cancer resection  1990s   By Dr. Pat Patrick  . ERCP N/A 08/28/2016   Procedure: ENDOSCOPIC RETROGRADE CHOLANGIOPANCREATOGRAPHY (ERCP);  Surgeon: Lucilla Lame, MD;  Location: [facility] ENDOSCOPY;  Service: Endoscopy;  Laterality: N/A;  . HERNIA REPAIR  1995   ventral  . INTRAMEDULLARY (IM) NAIL INTERTROCHANTERIC Left 09/26/2017   Procedure: INTRAMEDULLARY (IM) NAIL INTERTROCHANTRIC;  Surgeon: Lovell Sheehan, MD;  Location: [facility] ORS;  Service: Orthopedics;  Laterality: Left;  . JOINT REPLACEMENT    . OOPHORECTOMY    . SPINE SURGERY      Prior to Admission medications   Medication Sig Start Date End Date Taking? Authorizing Provider  acetaminophen (TYLENOL) 325 MG tablet Take 2 tablets (650 mg total) by mouth every 6 (six) hours as needed for mild pain (or Fever >/= 101). 09/29/17   [name], MD  albuterol (PROVENTIL) (2.5 MG/3ML) 0.083% nebulizer solution Take 3 mLs (2.5 mg total) by nebulization  every 6 (six) hours as needed for wheezing or shortness of breath. 09/30/17   Nicholes Mango, MD  aspirin EC 81 MG EC tablet Take 1 tablet (81 mg total) by mouth daily. 09/30/17   [name], MD  atenolol (TENORMIN) 50 MG tablet Take 50 mg by mouth daily.     [provider]  cholecalciferol (VITAMIN D) 1000 units tablet Take 1,000 Units by mouth daily.    [provider]  Cyanocobalamin (RA VITAMIN B-12 TR) 1000 MCG TBCR Take 1,000 mcg by mouth daily.     [provider]  docusate sodium (COLACE) 100 MG capsule Take 1 capsule (100 mg total) by mouth 2 (two) times daily. 09/29/17   [name], MD  donepezil (ARICEPT) 5 MG tablet Take 5 mg by mouth daily at 12 noon.     [provider]  fentaNYL (DURAGESIC - DOSED MCG/HR) 12 MCG/HR Place 1 patch (12.5 mcg total) onto the skin every 3 (three) days. 09/29/17   [name], MD  HYDROcodone-acetaminophen (NORCO) 10-325 MG tablet Take 1 tablet by mouth every 6 (six) hours as needed for severe pain. 09/29/17   [name], MD  levothyroxine (SYNTHROID, LEVOTHROID) 25 MCG tablet Take 25 mcg by mouth daily before breakfast.  04/27/16   [provider]  Melatonin 5 MG TABS Take 5 mg by mouth at bedtime as needed (sleep).    [provider]  mirtazapine (REMERON) 30 MG tablet Take 30 mg by mouth at bedtime.     [provider]  Multiple Vitamins-Minerals (THERA-M) TABS Take by mouth daily.    [provider]  pantoprazole (PROTONIX) 40 MG tablet Take 40 mg by mouth daily.    [provider]  sertraline (ZOLOFT) 25 MG tablet Take 25 mg by mouth daily.    [provider]  traZODone (DESYREL) 50 MG tablet Take 0.5 tablets (25 mg total) by mouth at bedtime as needed for sleep. 09/29/17   [name], MD    Allergies Patient has no known allergies.  Family History  Problem Relation Age of Onset  . Diabetes Mother   . Heart attack Mother   . Cancer Sister   . Diabetes Sister   . Alcohol abuse Brother   . Diabetes Brother   . Heart attack Brother   . Stroke Brother     Social History Social History   Tobacco Use  . Smoking status: Never Smoker  . Smokeless tobacco: Never Used  Substance Use Topics  . Alcohol use: No  . Drug use: No    Review of Systems  Constitutional: No fever/chills. Eyes: No visual changes. ENT: No sore throat. Cardiovascular: Denies chest pain. Respiratory: Denies shortness of  breath. Gastrointestinal: No abdominal pain.  No nausea, no vomiting.  No diarrhea.  No constipation. Genitourinary: Negative for dysuria. Musculoskeletal: Positive for left lower extremity pain.  Negative for back pain. Skin: Negative for rash. Neurological: Negative for headaches, focal weakness or numbness.   ____________________________________________   PHYSICAL EXAM:  VITAL SIGNS: ED Triage Vitals  Enc Vitals Group     BP 12/11/17 0135 (!) 144/75     Pulse Rate 12/11/17 0135 (!) 101     Resp 12/11/17 0135 16     Temp 12/11/17 0135 (!) 97.5 F (36.4 C)     Temp Source 12/11/17 0135 Oral     SpO2 12/11/17 0128 95 %     Weight 12/11/17 0136 163 lb 1.6 oz (74 kg)  Height 12/11/17 0136 5\' 6"  (1.676 m)     Head Circumference --      Peak Flow --      Pain Score --      Pain Loc --      Pain Edu? --      Excl. in [place]? --     Constitutional: Alert and oriented. Well appearing and in no acute distress. Eyes: Conjunctivae are normal. PERRL. EOMI. Head: Atraumatic. Nose: No congestion/rhinnorhea. Mouth/Throat: Mucous membranes are moist.  Oropharynx non-erythematous. Neck: No stridor.  No cervical spine tenderness to palpation. Cardiovascular: Normal rate, regular rhythm. Grossly normal heart sounds.  Good peripheral circulation. Respiratory: Normal respiratory effort.  No retractions. Lungs CTAB. Gastrointestinal: Soft and nontender. No distention. No abdominal bruits. No CVA tenderness. Musculoskeletal:  LLE: Moderate swelling to left knee and calf.  Patchy ecchymosis.  Tender to palpation anterior knee and calf.  Dressing over the lower part of foot and ankle.  Palpable pulses.  Symmetrically warm limb without evidence for ischemia.  Brisk, less than 5-second capillary refill. Neurologic:  Normal speech and language. No gross focal neurologic deficits are appreciated. MAEx4. Skin:  Skin is warm, dry and intact. No rash noted. Psychiatric: Mood and affect are normal.  Speech and behavior are normal.  ____________________________________________   LABS (all labs ordered are listed, but only abnormal results are displayed)  Labs Reviewed - No data to display ____________________________________________  EKG  None ____________________________________________  RADIOLOGY  ED MD interpretation: Proximal tibial fracture surrounding prosthetic  Official radiology report(s): Dg Tibia/fibula Left  Result Date: 12/11/2017 CLINICAL DATA:  Acute onset of left lower leg pain. Initial encounter. EXAM: LEFT TIBIA AND FIBULA - 2 VIEW COMPARISON:  Left knee radiographs performed 03/17/2008 FINDINGS: There is a mildly comminuted fracture of the proximal aspect of the tibia, extending to the tibial component of the prosthesis, better characterized on concurrent knee radiographs. There is mild displacement and suggestion of mild anterior impaction, with anterior angulation of the tibial component of the prosthesis. The remainder of the total knee arthroplasty is unremarkable. The intramedullary rod and nails at the distal femur are unremarkable. Soft tissue swelling is noted about the lower leg and knee. The ankle mortise is grossly unremarkable. IMPRESSION: Mildly comminuted fracture of the proximal aspect of the tibia, extending to the tibial component of the prosthesis. Mild displacement and suggestion of mild anterior impaction, with anterior angulation of the tibial component of the prosthesis. Electronically Signed   By: Garald Balding M.D.   On: 12/11/2017 02:44   Dg Knee Complete 4 Views Left  Result Date: 12/11/2017 CLINICAL DATA:  Acute onset of left lower leg pain and bruising. Initial encounter. EXAM: LEFT KNEE - COMPLETE 4+ VIEW COMPARISON:  Left knee radiographs performed 03/17/2008 FINDINGS: There is a comminuted fracture involving the proximal tibia, extending to the prosthesis, with mild displacement and apparent mild impaction anteriorly, and mild anterior  angulation of the tibial component of the prosthesis. Soft tissue swelling is noted about the knee. The femoral component is grossly unremarkable. The intramedullary rod and screws of the distal femur are unremarkable in appearance. A small knee joint effusion is noted. IMPRESSION: 1. Comminuted fracture involving the proximal tibia, extending to the prosthesis, with mild displacement and apparent mild impaction anteriorly. Mild anterior angulation of the tibial component of the prosthesis. 2. Small knee joint effusion. Electronically Signed   By: Garald Balding M.D.   On: 12/11/2017 02:43    ____________________________________________   PROCEDURES  Procedure(s)  performed: None  Procedures  Critical Care performed: No  ____________________________________________   INITIAL IMPRESSION / ASSESSMENT AND PLAN / ED COURSE  As part of my medical decision making, I reviewed the following data within the [place] notes reviewed and incorporated, Old chart reviewed, Radiograph reviewed and Notes from prior ED visits   84 year old female from SNF with left lower extremity pain, swelling and patchy ecchymosis.  Unknown if patient suffered an injury.  Differential diagnosis includes but is not limited to contusion, hematoma, DVT, fracture, etc.  Will administer Tylenol for discomfort, obtain plain film x-rays of knee and tib-fib.  If those are unremarkable, consider Doppler ultrasound of left lower leg to evaluate for DVT.  Clinical Course as of Dec 11 349  Wed Dec 11, 2017  0348 Patient's son is at bedside now who states patient fell between 1 PM and 5 PM yesterday afternoon.  This would be consistent with fracture seen on x-ray.  Son verifies that patient has not been ambulatory since her hip fracture.  Sees Dr. Harlow Mares from orthopedics.  Will place in a knee immobilizer and refer to orthopedics for follow-up.  I have reviewed patient's MAR which includes Tylenol and  fentanyl patch.  Tylenol is as needed; I encouraged the family to advocate for the patient and ask for it if she is in pain.  Strict return precautions given.  Patient and son verbalize understanding and agree with plan of care.   [JS]    Clinical Course User Index [JS] Paulette Blanch, MD     ____________________________________________   FINAL CLINICAL IMPRESSION(S) / ED DIAGNOSES  Final diagnoses:  Left leg pain  Closed fracture of proximal end of left tibia, unspecified fracture morphology, initial encounter     ED Discharge Orders    None       Note:  This document was prepared using Dragon voice recognition software and may include unintentional dictation errors.    Paulette Blanch, MD 12/11/17 [phone]

## 2017-12-11 NOTE — ED Notes (Signed)
Pt has hematoma to LLE, lateral aspect. Pt unable to say how she injured leg. NH staff reported to EMS that pt's LLE looked like her RLE at 0700 on 12/10/17. Pulses intact distal to bruising. LLE warm, swollen, tight, and tender at site of hematoma.

## 2017-12-11 NOTE — ED Triage Notes (Signed)
Pt bib [facility] from [facility] d/t left leg swelling and c/o pain, bruising noted. Pt has dressing to left foot dated 4/14. VSS. Pt has hx dementia. Unable to provide details regarding injury to leg.

## 2017-12-11 NOTE — ED Notes (Signed)
Pt's son reported that he left his mother at 77 yesterday. Pt's daughter arrived around 80 yesterday, called her brother to tell him their mother had fallen. NH did not report this to EMS.

## 2017-12-11 NOTE — Discharge Instructions (Addendum)
You may remove knee immobilizer to bathe.  Do not bear weight on right leg.  Return to the ER for worsening symptoms, persistent vomiting, difficulty breathing or other concerns.

## 2017-12-12 DIAGNOSIS — M79662 Pain in left lower leg: Secondary | ICD-10-CM | POA: Diagnosis not present

## 2017-12-12 DIAGNOSIS — M6281 Muscle weakness (generalized): Secondary | ICD-10-CM | POA: Diagnosis not present

## 2017-12-16 DIAGNOSIS — R52 Pain, unspecified: Secondary | ICD-10-CM | POA: Diagnosis not present

## 2017-12-17 DIAGNOSIS — M9702XA Periprosthetic fracture around internal prosthetic left hip joint, initial encounter: Secondary | ICD-10-CM | POA: Diagnosis not present

## 2017-12-17 DIAGNOSIS — S72145D Nondisplaced intertrochanteric fracture of left femur, subsequent encounter for closed fracture with routine healing: Secondary | ICD-10-CM | POA: Diagnosis not present

## 2017-12-19 DIAGNOSIS — S8012XD Contusion of left lower leg, subsequent encounter: Secondary | ICD-10-CM | POA: Diagnosis not present

## 2017-12-19 DIAGNOSIS — L89624 Pressure ulcer of left heel, stage 4: Secondary | ICD-10-CM | POA: Diagnosis not present

## 2017-12-24 DIAGNOSIS — L89624 Pressure ulcer of left heel, stage 4: Secondary | ICD-10-CM | POA: Diagnosis not present

## 2018-01-02 DIAGNOSIS — L89624 Pressure ulcer of left heel, stage 4: Secondary | ICD-10-CM | POA: Diagnosis not present

## 2018-01-03 DIAGNOSIS — Z79899 Other long term (current) drug therapy: Secondary | ICD-10-CM | POA: Diagnosis not present

## 2018-01-03 DIAGNOSIS — E039 Hypothyroidism, unspecified: Secondary | ICD-10-CM | POA: Diagnosis not present

## 2018-01-07 DIAGNOSIS — S72145D Nondisplaced intertrochanteric fracture of left femur, subsequent encounter for closed fracture with routine healing: Secondary | ICD-10-CM | POA: Diagnosis not present

## 2018-01-07 DIAGNOSIS — M25562 Pain in left knee: Secondary | ICD-10-CM | POA: Diagnosis not present

## 2018-01-07 DIAGNOSIS — E039 Hypothyroidism, unspecified: Secondary | ICD-10-CM | POA: Diagnosis not present

## 2018-01-07 DIAGNOSIS — M9702XD Periprosthetic fracture around internal prosthetic left hip joint, subsequent encounter: Secondary | ICD-10-CM | POA: Diagnosis not present

## 2018-01-07 DIAGNOSIS — E038 Other specified hypothyroidism: Secondary | ICD-10-CM | POA: Diagnosis not present

## 2018-01-09 DIAGNOSIS — L89624 Pressure ulcer of left heel, stage 4: Secondary | ICD-10-CM | POA: Diagnosis not present

## 2018-01-14 DIAGNOSIS — L89624 Pressure ulcer of left heel, stage 4: Secondary | ICD-10-CM | POA: Diagnosis not present

## 2018-01-15 DIAGNOSIS — E038 Other specified hypothyroidism: Secondary | ICD-10-CM | POA: Diagnosis not present

## 2018-01-15 DIAGNOSIS — E039 Hypothyroidism, unspecified: Secondary | ICD-10-CM | POA: Diagnosis not present

## 2018-01-15 DIAGNOSIS — I10 Essential (primary) hypertension: Secondary | ICD-10-CM | POA: Diagnosis not present

## 2018-01-23 DIAGNOSIS — L89624 Pressure ulcer of left heel, stage 4: Secondary | ICD-10-CM | POA: Diagnosis not present

## 2018-01-28 DIAGNOSIS — S72145D Nondisplaced intertrochanteric fracture of left femur, subsequent encounter for closed fracture with routine healing: Secondary | ICD-10-CM | POA: Diagnosis not present

## 2018-01-28 DIAGNOSIS — M9702XD Periprosthetic fracture around internal prosthetic left hip joint, subsequent encounter: Secondary | ICD-10-CM | POA: Diagnosis not present

## 2018-01-30 DIAGNOSIS — L89624 Pressure ulcer of left heel, stage 4: Secondary | ICD-10-CM | POA: Diagnosis not present

## 2018-02-06 DIAGNOSIS — L89624 Pressure ulcer of left heel, stage 4: Secondary | ICD-10-CM | POA: Diagnosis not present

## 2018-02-13 DIAGNOSIS — L89624 Pressure ulcer of left heel, stage 4: Secondary | ICD-10-CM | POA: Diagnosis not present

## 2018-02-14 DIAGNOSIS — E039 Hypothyroidism, unspecified: Secondary | ICD-10-CM | POA: Diagnosis not present

## 2018-02-14 DIAGNOSIS — Z0000 Encounter for general adult medical examination without abnormal findings: Secondary | ICD-10-CM | POA: Diagnosis not present

## 2018-02-14 DIAGNOSIS — E785 Hyperlipidemia, unspecified: Secondary | ICD-10-CM | POA: Diagnosis not present

## 2018-02-14 DIAGNOSIS — G301 Alzheimer's disease with late onset: Secondary | ICD-10-CM | POA: Diagnosis not present

## 2018-02-14 DIAGNOSIS — F0281 Dementia in other diseases classified elsewhere with behavioral disturbance: Secondary | ICD-10-CM | POA: Diagnosis not present

## 2018-02-14 DIAGNOSIS — F325 Major depressive disorder, single episode, in full remission: Secondary | ICD-10-CM | POA: Diagnosis not present

## 2018-02-14 DIAGNOSIS — I10 Essential (primary) hypertension: Secondary | ICD-10-CM | POA: Diagnosis not present

## 2018-02-20 DIAGNOSIS — L89624 Pressure ulcer of left heel, stage 4: Secondary | ICD-10-CM | POA: Diagnosis not present

## 2018-02-25 DIAGNOSIS — S72145D Nondisplaced intertrochanteric fracture of left femur, subsequent encounter for closed fracture with routine healing: Secondary | ICD-10-CM | POA: Diagnosis not present

## 2018-02-25 DIAGNOSIS — M6281 Muscle weakness (generalized): Secondary | ICD-10-CM | POA: Diagnosis not present

## 2018-02-25 DIAGNOSIS — M9702XD Periprosthetic fracture around internal prosthetic left hip joint, subsequent encounter: Secondary | ICD-10-CM | POA: Diagnosis not present

## 2018-02-25 DIAGNOSIS — M79605 Pain in left leg: Secondary | ICD-10-CM | POA: Diagnosis not present

## 2018-02-25 DIAGNOSIS — R2689 Other abnormalities of gait and mobility: Secondary | ICD-10-CM | POA: Diagnosis not present

## 2018-02-28 DIAGNOSIS — L89624 Pressure ulcer of left heel, stage 4: Secondary | ICD-10-CM | POA: Diagnosis not present

## 2018-03-06 DIAGNOSIS — L89624 Pressure ulcer of left heel, stage 4: Secondary | ICD-10-CM | POA: Diagnosis not present

## 2018-03-13 DIAGNOSIS — L89624 Pressure ulcer of left heel, stage 4: Secondary | ICD-10-CM | POA: Diagnosis not present

## 2018-03-18 ENCOUNTER — Telehealth (INDEPENDENT_AMBULATORY_CARE_PROVIDER_SITE_OTHER): Payer: Self-pay | Admitting: Vascular Surgery

## 2018-03-18 NOTE — Telephone Encounter (Signed)
Patient daughter Kerri Rangel called because her mom is having some left leg/ankle swelling. She sits in her wheelchair and goes up down hallway for about 12hrs day. Would like to speak to someone as to what she should do.

## 2018-03-20 DIAGNOSIS — L89624 Pressure ulcer of left heel, stage 4: Secondary | ICD-10-CM | POA: Diagnosis not present

## 2018-03-31 DIAGNOSIS — M6281 Muscle weakness (generalized): Secondary | ICD-10-CM | POA: Diagnosis not present

## 2018-03-31 DIAGNOSIS — F0390 Unspecified dementia without behavioral disturbance: Secondary | ICD-10-CM | POA: Diagnosis not present

## 2018-03-31 DIAGNOSIS — E039 Hypothyroidism, unspecified: Secondary | ICD-10-CM | POA: Diagnosis not present

## 2018-03-31 DIAGNOSIS — F3341 Major depressive disorder, recurrent, in partial remission: Secondary | ICD-10-CM | POA: Diagnosis not present

## 2018-03-31 DIAGNOSIS — L89629 Pressure ulcer of left heel, unspecified stage: Secondary | ICD-10-CM | POA: Diagnosis not present

## 2018-03-31 DIAGNOSIS — Z7401 Bed confinement status: Secondary | ICD-10-CM | POA: Diagnosis not present

## 2018-03-31 DIAGNOSIS — G4700 Insomnia, unspecified: Secondary | ICD-10-CM | POA: Diagnosis not present

## 2018-04-10 DIAGNOSIS — F419 Anxiety disorder, unspecified: Secondary | ICD-10-CM | POA: Diagnosis not present

## 2018-04-15 DIAGNOSIS — M6281 Muscle weakness (generalized): Secondary | ICD-10-CM | POA: Diagnosis not present

## 2018-04-15 DIAGNOSIS — R2689 Other abnormalities of gait and mobility: Secondary | ICD-10-CM | POA: Diagnosis not present

## 2018-04-17 DIAGNOSIS — Z79899 Other long term (current) drug therapy: Secondary | ICD-10-CM | POA: Diagnosis not present

## 2018-04-17 DIAGNOSIS — R319 Hematuria, unspecified: Secondary | ICD-10-CM | POA: Diagnosis not present

## 2018-04-18 DIAGNOSIS — F0281 Dementia in other diseases classified elsewhere with behavioral disturbance: Secondary | ICD-10-CM | POA: Diagnosis not present

## 2018-04-18 DIAGNOSIS — G301 Alzheimer's disease with late onset: Secondary | ICD-10-CM | POA: Diagnosis not present

## 2018-04-18 DIAGNOSIS — F325 Major depressive disorder, single episode, in full remission: Secondary | ICD-10-CM | POA: Diagnosis not present

## 2018-04-18 DIAGNOSIS — E039 Hypothyroidism, unspecified: Secondary | ICD-10-CM | POA: Diagnosis not present

## 2018-04-18 DIAGNOSIS — I10 Essential (primary) hypertension: Secondary | ICD-10-CM | POA: Diagnosis not present

## 2018-04-18 DIAGNOSIS — Z789 Other specified health status: Secondary | ICD-10-CM | POA: Diagnosis not present

## 2018-04-29 DIAGNOSIS — N390 Urinary tract infection, site not specified: Secondary | ICD-10-CM | POA: Diagnosis not present

## 2018-04-29 DIAGNOSIS — F419 Anxiety disorder, unspecified: Secondary | ICD-10-CM | POA: Diagnosis not present

## 2018-04-29 DIAGNOSIS — F0390 Unspecified dementia without behavioral disturbance: Secondary | ICD-10-CM | POA: Diagnosis not present

## 2018-04-29 DIAGNOSIS — E039 Hypothyroidism, unspecified: Secondary | ICD-10-CM | POA: Diagnosis not present

## 2018-04-29 DIAGNOSIS — F329 Major depressive disorder, single episode, unspecified: Secondary | ICD-10-CM | POA: Diagnosis not present

## 2018-05-14 DIAGNOSIS — R319 Hematuria, unspecified: Secondary | ICD-10-CM | POA: Diagnosis not present

## 2018-05-14 DIAGNOSIS — Z79899 Other long term (current) drug therapy: Secondary | ICD-10-CM | POA: Diagnosis not present

## 2018-05-14 DIAGNOSIS — N390 Urinary tract infection, site not specified: Secondary | ICD-10-CM | POA: Diagnosis not present

## 2018-05-28 DIAGNOSIS — G894 Chronic pain syndrome: Secondary | ICD-10-CM | POA: Diagnosis not present

## 2018-05-28 DIAGNOSIS — Z79899 Other long term (current) drug therapy: Secondary | ICD-10-CM | POA: Diagnosis not present

## 2018-05-28 DIAGNOSIS — F0390 Unspecified dementia without behavioral disturbance: Secondary | ICD-10-CM | POA: Diagnosis not present

## 2018-05-28 DIAGNOSIS — I10 Essential (primary) hypertension: Secondary | ICD-10-CM | POA: Diagnosis not present

## 2018-05-28 DIAGNOSIS — R269 Unspecified abnormalities of gait and mobility: Secondary | ICD-10-CM | POA: Diagnosis not present

## 2018-05-29 DIAGNOSIS — F419 Anxiety disorder, unspecified: Secondary | ICD-10-CM | POA: Diagnosis not present

## 2018-05-29 DIAGNOSIS — G894 Chronic pain syndrome: Secondary | ICD-10-CM | POA: Diagnosis not present

## 2018-05-29 DIAGNOSIS — F0390 Unspecified dementia without behavioral disturbance: Secondary | ICD-10-CM | POA: Diagnosis not present

## 2018-05-29 DIAGNOSIS — I10 Essential (primary) hypertension: Secondary | ICD-10-CM | POA: Diagnosis not present

## 2018-06-04 DIAGNOSIS — M6281 Muscle weakness (generalized): Secondary | ICD-10-CM | POA: Diagnosis not present

## 2018-06-04 DIAGNOSIS — R2681 Unsteadiness on feet: Secondary | ICD-10-CM | POA: Diagnosis not present

## 2018-06-04 DIAGNOSIS — R262 Difficulty in walking, not elsewhere classified: Secondary | ICD-10-CM | POA: Diagnosis not present

## 2018-06-05 DIAGNOSIS — Z79899 Other long term (current) drug therapy: Secondary | ICD-10-CM | POA: Diagnosis not present

## 2018-06-09 ENCOUNTER — Emergency Department
Admission: EM | Admit: 2018-06-09 | Discharge: 2018-06-09 | Disposition: A | Discharge status: Home / Self Care | Payer: PPO | Attending: Emergency Medicine | Admitting: Emergency Medicine

## 2018-06-09 ENCOUNTER — Other Ambulatory Visit: Payer: Self-pay

## 2018-06-09 ENCOUNTER — Emergency Department: Payer: PPO

## 2018-06-09 DIAGNOSIS — Z7982 Long term (current) use of aspirin: Secondary | ICD-10-CM | POA: Insufficient documentation

## 2018-06-09 DIAGNOSIS — F0390 Unspecified dementia without behavioral disturbance: Secondary | ICD-10-CM | POA: Insufficient documentation

## 2018-06-09 DIAGNOSIS — R4182 Altered mental status, unspecified: Secondary | ICD-10-CM | POA: Diagnosis not present

## 2018-06-09 DIAGNOSIS — E039 Hypothyroidism, unspecified: Secondary | ICD-10-CM | POA: Diagnosis not present

## 2018-06-09 DIAGNOSIS — Z79899 Other long term (current) drug therapy: Secondary | ICD-10-CM | POA: Insufficient documentation

## 2018-06-09 DIAGNOSIS — R0902 Hypoxemia: Secondary | ICD-10-CM | POA: Diagnosis not present

## 2018-06-09 DIAGNOSIS — Z85038 Personal history of other malignant neoplasm of large intestine: Secondary | ICD-10-CM | POA: Diagnosis not present

## 2018-06-09 DIAGNOSIS — R001 Bradycardia, unspecified: Secondary | ICD-10-CM | POA: Diagnosis not present

## 2018-06-09 DIAGNOSIS — I10 Essential (primary) hypertension: Secondary | ICD-10-CM | POA: Diagnosis not present

## 2018-06-09 DIAGNOSIS — N390 Urinary tract infection, site not specified: Secondary | ICD-10-CM | POA: Diagnosis not present

## 2018-06-09 DIAGNOSIS — R531 Weakness: Secondary | ICD-10-CM | POA: Diagnosis not present

## 2018-06-09 DIAGNOSIS — R5383 Other fatigue: Secondary | ICD-10-CM | POA: Diagnosis not present

## 2018-06-09 LAB — URINALYSIS, COMPLETE (UACMP) WITH MICROSCOPIC
BILIRUBIN URINE: NEGATIVE
Glucose, UA: NEGATIVE mg/dL
KETONES UR: NEGATIVE mg/dL
LEUKOCYTES UA: NEGATIVE
Nitrite: POSITIVE — AB
PH: 6 (ref 5.0–8.0)
PROTEIN: NEGATIVE mg/dL
Specific Gravity, Urine: 1.016 (ref 1.005–1.030)

## 2018-06-09 LAB — COMPREHENSIVE METABOLIC PANEL
ALBUMIN: 2.9 g/dL — AB (ref 3.5–5.0)
ALT: 23 U/L (ref 0–44)
AST: 24 U/L (ref 15–41)
Alkaline Phosphatase: 82 U/L (ref 38–126)
Anion gap: 4 — ABNORMAL LOW (ref 5–15)
BILIRUBIN TOTAL: 0.7 mg/dL (ref 0.3–1.2)
BUN: 14 mg/dL (ref 8–23)
CHLORIDE: 113 mmol/L — AB (ref 98–111)
CO2: 26 mmol/L (ref 22–32)
Calcium: 9.5 mg/dL (ref 8.9–10.3)
Creatinine, Ser: 0.9 mg/dL (ref 0.44–1.00)
GFR calc Af Amer: >60 mL/min (ref >60)
GFR, EST NON AFRICAN AMERICAN: 54 mL/min — AB (ref >60)
GLUCOSE: 98 mg/dL (ref 70–99)
POTASSIUM: 3.6 mmol/L (ref 3.5–5.1)
Sodium: 143 mmol/L (ref 135–145)
Total Protein: 5.9 g/dL — ABNORMAL LOW (ref 6.5–8.1)

## 2018-06-09 LAB — CBC
HEMATOCRIT: 35.9 % — AB (ref 36.0–46.0)
Hemoglobin: 10.9 g/dL — ABNORMAL LOW (ref 12.0–15.0)
MCH: 28.8 pg (ref 26.0–34.0)
MCHC: 30.4 g/dL (ref 30.0–36.0)
MCV: 95 fL (ref 80.0–100.0)
NRBC: 0 % (ref 0.0–0.2)
PLATELETS: 286 10*3/uL (ref 150–400)
RBC: 3.78 MIL/uL — AB (ref 3.87–5.11)
RDW: 13.3 % (ref 11.5–15.5)
WBC: 5.5 10*3/uL (ref 4.0–10.5)

## 2018-06-09 LAB — TROPONIN I

## 2018-06-09 MED ORDER — SODIUM CHLORIDE 0.9 % IV SOLN
1.0000 g | Freq: Once | INTRAVENOUS | Status: AC
  Administered 2018-06-09: 1 g via INTRAVENOUS
  Filled 2018-06-09: qty 10

## 2018-06-09 MED ORDER — CEPHALEXIN 500 MG PO CAPS: 500.0000 mg | ORAL_CAPSULE | Freq: Two times a day (BID) | ORAL | 0 refills | Status: AC

## 2018-06-09 NOTE — ED Triage Notes (Signed)
Pt from [facility]. Pt had physical therapy today- per PT pt less talkative then normal. ?AMS. Pt has hx of dementia. A&O x3.

## 2018-06-09 NOTE — ED Provider Notes (Signed)
[facility] Emergency Department Provider Note  Time seen: 11:25 AM  I have reviewed the triage vital signs and the nursing notes.   HISTORY  Chief Complaint Altered Mental Status    HPI Kerri Rangel is a 84 y.o. female with a past medical history of dementia, gastric reflux, hypertension, hyperlipidemia, anemia, anxiety presents to the emergency department for fatigue.  According to EMS per nursing home report patient has dementia at baseline but is usually very talkative.  They noted today that the patient seemed very tired fatigued not wanting to talk much or interact, which they state is abnormal.  No reported fever vomiting or diarrhea.  Patient has dementia and unfortunately cannot contribute to her current history or review of systems although denies any pain or discomfort currently.  Patient is awake she is alert she is lying in bed does appear somewhat somnolent but is able to answer questions and follow commands.  Pleasant.   Past Medical History:  Diagnosis Date  . Anemia   . Anxiety 08/07/2016  . Cancer ([place])    colon ca  . Common bile duct (CBD) obstruction 08/01/2016   Overview:  Possible stone noted 07/26/16 - refer to GI. LFTs nml but does have chronic abd pain   . Dementia   . Depression 08/07/2016  . Dizzy 03/02/2014  . Edema 03/02/2014  . Gallstone 08/01/2016  . GERD (gastroesophageal reflux disease)   . Heart disease   . Hyperlipidemia, unspecified 08/07/2016  . Hypertension 08/07/2016  . Hypothyroidism 08/07/2016  . Late onset Alzheimer's disease with behavioral disturbance 03/02/2015  . Osteoporosis   . Vitamin D deficiency     Patient Active Problem List   Diagnosis Date Noted  . Pressure ulcer, heel 11/08/2017  . Closed left hip fracture ([place]) 09/25/2017  . Carotid artery disease ([place]) 11/06/2016  . Anxiety 08/07/2016  . Depression 08/07/2016  . Hyperlipidemia, unspecified 08/07/2016  . Hypertension 08/07/2016  .  Hypothyroidism 08/07/2016  . Vitamin D deficiency   . Dementia ([place])   . Common bile duct (CBD) obstruction 08/01/2016  . Gallstone 08/01/2016  . Late onset Alzheimer's disease with behavioral disturbance ([place]) 03/02/2015  . Dizzy 03/02/2014  . Edema 03/02/2014    Past Surgical History:  Procedure Laterality Date  . ABDOMINAL HYSTERECTOMY    . APPENDECTOMY    . Back sugery  03/2002   lumbar disk and fusion. Dr. Mauri Pole   . BREAST EXCISIONAL BIOPSY     Benign. Dr. Pat Patrick  . BREAST LUMPECTOMY N/A 1971   unsure  . CATARACT EXTRACTION Bilateral   . colon cancer resection  1990s   By Dr. Pat Patrick  . ERCP N/A 08/28/2016   Procedure: ENDOSCOPIC RETROGRADE CHOLANGIOPANCREATOGRAPHY (ERCP);  Surgeon: Lucilla Lame, MD;  Location: [facility] ENDOSCOPY;  Service: Endoscopy;  Laterality: N/A;  . HERNIA REPAIR  1995   ventral  . INTRAMEDULLARY (IM) NAIL INTERTROCHANTERIC Left 09/26/2017   Procedure: INTRAMEDULLARY (IM) NAIL INTERTROCHANTRIC;  Surgeon: Lovell Sheehan, MD;  Location: [facility] ORS;  Service: Orthopedics;  Laterality: Left;  . JOINT REPLACEMENT    . OOPHORECTOMY    . SPINE SURGERY      Prior to Admission medications   Medication Sig Start Date End Date Taking? Authorizing Provider  acetaminophen (TYLENOL) 325 MG tablet Take 2 tablets (650 mg total) by mouth every 6 (six) hours as needed for mild pain (or Fever >/= 101). 09/29/17   [name], MD  albuterol (PROVENTIL) (2.5 MG/3ML) 0.083% nebulizer solution Take 3  mLs (2.5 mg total) by nebulization every 6 (six) hours as needed for wheezing or shortness of breath. 09/30/17   Nicholes Mango, MD  aspirin EC 81 MG EC tablet Take 1 tablet (81 mg total) by mouth daily. 09/30/17   [name], MD  atenolol (TENORMIN) 50 MG tablet Take 50 mg by mouth daily.     [provider]  cholecalciferol (VITAMIN D) 1000 units tablet Take 1,000 Units by mouth daily.    [provider]  Cyanocobalamin (RA VITAMIN B-12 TR) 1000 MCG TBCR Take 1,000  mcg by mouth daily.     [provider]  docusate sodium (COLACE) 100 MG capsule Take 1 capsule (100 mg total) by mouth 2 (two) times daily. 09/29/17   [name], MD  donepezil (ARICEPT) 5 MG tablet Take 5 mg by mouth daily at 12 noon.     [provider]  fentaNYL (DURAGESIC - DOSED MCG/HR) 12 MCG/HR Place 1 patch (12.5 mcg total) onto the skin every 3 (three) days. 09/29/17   [name], MD  HYDROcodone-acetaminophen (NORCO) 10-325 MG tablet Take 1 tablet by mouth every 6 (six) hours as needed for severe pain. 09/29/17   [name], MD  levothyroxine (SYNTHROID, LEVOTHROID) 25 MCG tablet Take 25 mcg by mouth daily before breakfast.  04/27/16   [provider]  Melatonin 5 MG TABS Take 5 mg by mouth at bedtime as needed (sleep).    [provider]  mirtazapine (REMERON) 30 MG tablet Take 30 mg by mouth at bedtime.     [provider]  Multiple Vitamins-Minerals (THERA-M) TABS Take by mouth daily.    [provider]  pantoprazole (PROTONIX) 40 MG tablet Take 40 mg by mouth daily.    [provider]  sertraline (ZOLOFT) 25 MG tablet Take 25 mg by mouth daily.    [provider]  traZODone (DESYREL) 50 MG tablet Take 0.5 tablets (25 mg total) by mouth at bedtime as needed for sleep. 09/29/17   [name], MD    No Known Allergies  Family History  Problem Relation Age of Onset  . Diabetes Mother   . Heart attack Mother   . Cancer Sister   . Diabetes Sister   . Alcohol abuse Brother   . Diabetes Brother   . Heart attack Brother   . Stroke Brother     Social History Social History   Tobacco Use  . Smoking status: Never Smoker  . Smokeless tobacco: Never Used  Substance Use Topics  . Alcohol use: No  . Drug use: No    Review of Systems Able to obtain an adequate/accurate review of systems secondary to dementia  ____________________________________________   PHYSICAL EXAM:  Constitutional:  Alert, pleasant, does appear somewhat fatigued but is staying awake answering questions following commands. Eyes: Normal exam ENT   Head: Normocephalic and atraumatic.   Mouth/Throat: Mucous membranes are moist. Cardiovascular: Normal rate, regular rhythm.  Respiratory: Normal respiratory effort without tachypnea nor retractions. Breath sounds are clear  Gastrointestinal: Soft and nontender. No distention.  Musculoskeletal: Nontender with normal range of motion in all extremities.  Lower extremity edema which is chronic per EMS. Neurologic:  Normal speech and language. No gross focal neurologic deficits  Skin:  Skin is warm, dry and intact.  Psychiatric: Mood and affect are normal.   ____________________________________________    EKG  EKG reviewed and interpreted by myself shows a sinus bradycardia at 46 bpm with a narrow QRS, normal axis, largely normal  intervals with no concerning ST changes.  ____________________________________________    RADIOLOGY  CT had negative for acute abnormality.  ____________________________________________   INITIAL IMPRESSION / ASSESSMENT AND PLAN / ED COURSE  Pertinent labs & imaging results that were available during my care of the patient were reviewed by me and considered in my medical decision making (see chart for details).  Presents the emergency department for generalized weakness.  Differential is quite broad but would include infectious etiology, metabolic or electrolyte abnormality, dehydration, ACS.  We will check labs including cardiac enzymes, urinalysis, obtain CT scan of the head and an EKG.  We will IV hydrate while awaiting results.  Patient agreeable to plan of care.  CT scan is negative, EKG is reassuring.  Patient's lab work shows a urinary tract infection otherwise at baseline.  I have sent a urine culture.  We will dose IV Rocephin.  Patient will be discharged with Keflex and PCP follow-up.  Patient agreeable to plan  of care.  Currently awake, no distress.  Reassuringly afebrile with a normal white blood cell count.  ____________________________________________   FINAL CLINICAL IMPRESSION(S) / ED DIAGNOSES  Generalized weakness/fatigue Urinary tract infection   Harvest Dark, MD 06/09/18 1426

## 2018-06-11 LAB — URINE CULTURE

## 2018-06-12 DIAGNOSIS — Z79899 Other long term (current) drug therapy: Secondary | ICD-10-CM | POA: Diagnosis not present

## 2018-06-12 DIAGNOSIS — G301 Alzheimer's disease with late onset: Secondary | ICD-10-CM | POA: Diagnosis not present

## 2018-06-12 DIAGNOSIS — N3000 Acute cystitis without hematuria: Secondary | ICD-10-CM | POA: Diagnosis not present

## 2018-06-12 DIAGNOSIS — R001 Bradycardia, unspecified: Secondary | ICD-10-CM | POA: Diagnosis not present

## 2018-06-12 DIAGNOSIS — M545 Low back pain: Secondary | ICD-10-CM | POA: Diagnosis not present

## 2018-06-25 DIAGNOSIS — F0390 Unspecified dementia without behavioral disturbance: Secondary | ICD-10-CM | POA: Diagnosis not present

## 2018-06-25 DIAGNOSIS — R21 Rash and other nonspecific skin eruption: Secondary | ICD-10-CM | POA: Diagnosis not present

## 2018-06-25 DIAGNOSIS — R269 Unspecified abnormalities of gait and mobility: Secondary | ICD-10-CM | POA: Diagnosis not present

## 2018-06-25 DIAGNOSIS — G894 Chronic pain syndrome: Secondary | ICD-10-CM | POA: Diagnosis not present

## 2018-06-27 DIAGNOSIS — M6281 Muscle weakness (generalized): Secondary | ICD-10-CM | POA: Diagnosis not present

## 2018-06-27 DIAGNOSIS — R262 Difficulty in walking, not elsewhere classified: Secondary | ICD-10-CM | POA: Diagnosis not present

## 2018-06-27 DIAGNOSIS — R2681 Unsteadiness on feet: Secondary | ICD-10-CM | POA: Diagnosis not present

## 2018-06-30 DIAGNOSIS — R269 Unspecified abnormalities of gait and mobility: Secondary | ICD-10-CM | POA: Diagnosis not present

## 2018-06-30 DIAGNOSIS — I10 Essential (primary) hypertension: Secondary | ICD-10-CM | POA: Diagnosis not present

## 2018-06-30 DIAGNOSIS — Z79899 Other long term (current) drug therapy: Secondary | ICD-10-CM | POA: Diagnosis not present

## 2018-06-30 DIAGNOSIS — F0391 Unspecified dementia with behavioral disturbance: Secondary | ICD-10-CM | POA: Diagnosis not present

## 2018-06-30 DIAGNOSIS — G894 Chronic pain syndrome: Secondary | ICD-10-CM | POA: Diagnosis not present

## 2018-07-28 DIAGNOSIS — R262 Difficulty in walking, not elsewhere classified: Secondary | ICD-10-CM | POA: Diagnosis not present

## 2018-07-28 DIAGNOSIS — R2681 Unsteadiness on feet: Secondary | ICD-10-CM | POA: Diagnosis not present

## 2018-07-28 DIAGNOSIS — M6281 Muscle weakness (generalized): Secondary | ICD-10-CM | POA: Diagnosis not present

## 2018-07-30 DIAGNOSIS — G894 Chronic pain syndrome: Secondary | ICD-10-CM | POA: Diagnosis not present

## 2018-07-30 DIAGNOSIS — Z79899 Other long term (current) drug therapy: Secondary | ICD-10-CM | POA: Diagnosis not present

## 2018-07-30 DIAGNOSIS — I10 Essential (primary) hypertension: Secondary | ICD-10-CM | POA: Diagnosis not present

## 2018-07-30 DIAGNOSIS — R600 Localized edema: Secondary | ICD-10-CM | POA: Diagnosis not present

## 2018-07-30 DIAGNOSIS — F0390 Unspecified dementia without behavioral disturbance: Secondary | ICD-10-CM | POA: Diagnosis not present

## 2018-07-30 DIAGNOSIS — R609 Edema, unspecified: Secondary | ICD-10-CM | POA: Diagnosis not present

## 2018-08-13 DIAGNOSIS — G894 Chronic pain syndrome: Secondary | ICD-10-CM | POA: Diagnosis not present

## 2018-08-13 DIAGNOSIS — F0390 Unspecified dementia without behavioral disturbance: Secondary | ICD-10-CM | POA: Diagnosis not present

## 2018-08-13 DIAGNOSIS — Z79899 Other long term (current) drug therapy: Secondary | ICD-10-CM | POA: Diagnosis not present

## 2018-08-13 DIAGNOSIS — R269 Unspecified abnormalities of gait and mobility: Secondary | ICD-10-CM | POA: Diagnosis not present

## 2018-08-25 DIAGNOSIS — I10 Essential (primary) hypertension: Secondary | ICD-10-CM | POA: Diagnosis not present

## 2018-08-25 DIAGNOSIS — F0390 Unspecified dementia without behavioral disturbance: Secondary | ICD-10-CM | POA: Diagnosis not present

## 2018-08-25 DIAGNOSIS — F419 Anxiety disorder, unspecified: Secondary | ICD-10-CM | POA: Diagnosis not present

## 2018-08-25 DIAGNOSIS — G894 Chronic pain syndrome: Secondary | ICD-10-CM | POA: Diagnosis not present

## 2018-08-27 DIAGNOSIS — R262 Difficulty in walking, not elsewhere classified: Secondary | ICD-10-CM | POA: Diagnosis not present

## 2018-08-27 DIAGNOSIS — M6281 Muscle weakness (generalized): Secondary | ICD-10-CM | POA: Diagnosis not present

## 2018-08-27 DIAGNOSIS — R2681 Unsteadiness on feet: Secondary | ICD-10-CM | POA: Diagnosis not present

## 2018-09-03 DIAGNOSIS — I10 Essential (primary) hypertension: Secondary | ICD-10-CM | POA: Diagnosis not present

## 2018-09-03 DIAGNOSIS — F0390 Unspecified dementia without behavioral disturbance: Secondary | ICD-10-CM | POA: Diagnosis not present

## 2018-09-03 DIAGNOSIS — G894 Chronic pain syndrome: Secondary | ICD-10-CM | POA: Diagnosis not present

## 2018-09-03 DIAGNOSIS — F419 Anxiety disorder, unspecified: Secondary | ICD-10-CM | POA: Diagnosis not present

## 2018-09-08 DIAGNOSIS — E039 Hypothyroidism, unspecified: Secondary | ICD-10-CM | POA: Diagnosis not present

## 2018-09-08 DIAGNOSIS — G894 Chronic pain syndrome: Secondary | ICD-10-CM | POA: Diagnosis not present

## 2018-09-08 DIAGNOSIS — I10 Essential (primary) hypertension: Secondary | ICD-10-CM | POA: Diagnosis not present

## 2018-09-08 DIAGNOSIS — F0390 Unspecified dementia without behavioral disturbance: Secondary | ICD-10-CM | POA: Diagnosis not present

## 2018-09-08 DIAGNOSIS — F419 Anxiety disorder, unspecified: Secondary | ICD-10-CM | POA: Diagnosis not present

## 2018-09-08 DIAGNOSIS — Z79899 Other long term (current) drug therapy: Secondary | ICD-10-CM | POA: Diagnosis not present

## 2018-09-25 DIAGNOSIS — F419 Anxiety disorder, unspecified: Secondary | ICD-10-CM | POA: Diagnosis not present

## 2018-09-25 DIAGNOSIS — I10 Essential (primary) hypertension: Secondary | ICD-10-CM | POA: Diagnosis not present

## 2018-09-25 DIAGNOSIS — Z79899 Other long term (current) drug therapy: Secondary | ICD-10-CM | POA: Diagnosis not present

## 2018-09-25 DIAGNOSIS — R451 Restlessness and agitation: Secondary | ICD-10-CM | POA: Diagnosis not present

## 2018-09-25 DIAGNOSIS — G894 Chronic pain syndrome: Secondary | ICD-10-CM | POA: Diagnosis not present

## 2018-09-25 DIAGNOSIS — F0390 Unspecified dementia without behavioral disturbance: Secondary | ICD-10-CM | POA: Diagnosis not present

## 2018-09-25 DIAGNOSIS — N390 Urinary tract infection, site not specified: Secondary | ICD-10-CM | POA: Diagnosis not present

## 2018-09-27 DIAGNOSIS — R2681 Unsteadiness on feet: Secondary | ICD-10-CM | POA: Diagnosis not present

## 2018-09-27 DIAGNOSIS — M6281 Muscle weakness (generalized): Secondary | ICD-10-CM | POA: Diagnosis not present

## 2018-09-27 DIAGNOSIS — R262 Difficulty in walking, not elsewhere classified: Secondary | ICD-10-CM | POA: Diagnosis not present

## 2018-10-09 DIAGNOSIS — Z79899 Other long term (current) drug therapy: Secondary | ICD-10-CM | POA: Diagnosis not present

## 2018-10-09 DIAGNOSIS — R600 Localized edema: Secondary | ICD-10-CM | POA: Diagnosis not present

## 2018-10-09 DIAGNOSIS — F0390 Unspecified dementia without behavioral disturbance: Secondary | ICD-10-CM | POA: Diagnosis not present

## 2018-10-09 DIAGNOSIS — I10 Essential (primary) hypertension: Secondary | ICD-10-CM | POA: Diagnosis not present

## 2018-10-12 IMAGING — XA DG HIP (WITH PELVIS) OPERATIVE*L*
14 of 21 series · 14 of 21 positions shown · non-contrast
Comparison: Plain film of the left hip performed on 09/25/2017.

CLINICAL DATA: Surgery

EXAM:
OPERATIVE RIGHT HIP (WITH PELVIS IF PERFORMED) 21 VIEWS
TECHNIQUE: Fluoroscopic spot image(s) were submitted for interpretation
post-operatively.

[Series 1: cont. · 1 of 1 slices shown (1 of 14)]
[im 1/1]
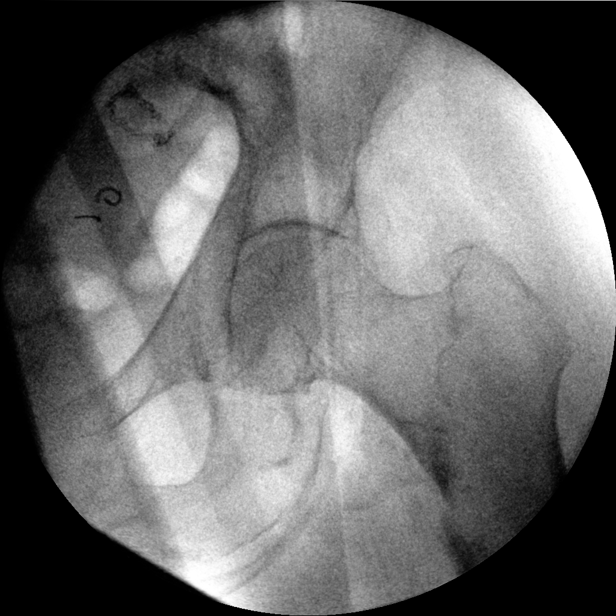

[Series 3: cont. · 1 of 1 slices shown (2 of 14)]
[im 1/1]
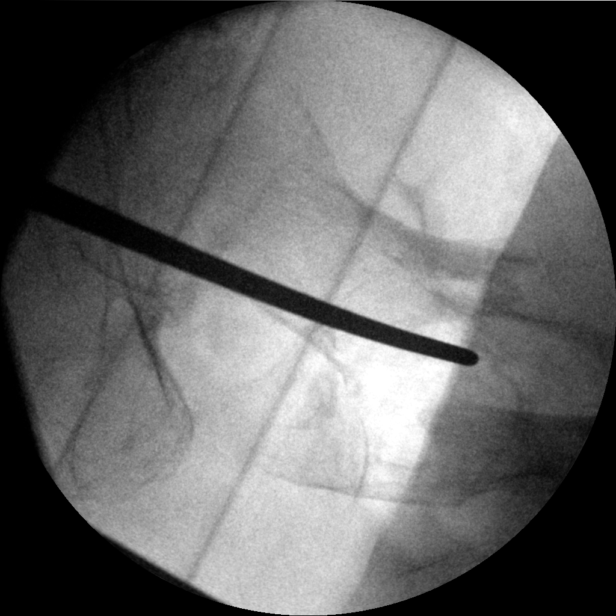

[Series 4: cont. · 1 of 1 slices shown (3 of 14)]
[im 1/1]
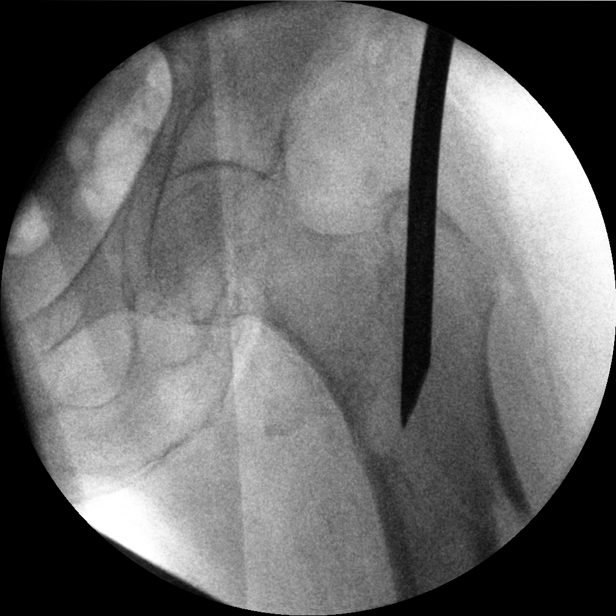

[Series 6: cont. · 1 of 1 slices shown (4 of 14)]
[im 1/1]
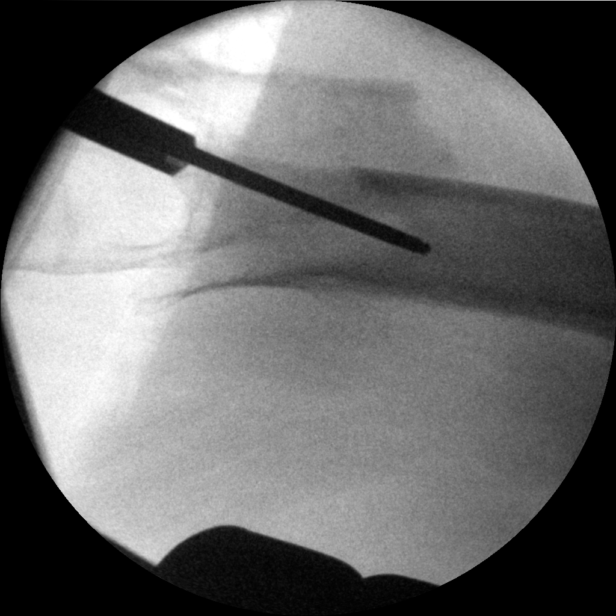

[Series 7: cont. · 1 of 1 slices shown (5 of 14)]
[im 1/1]
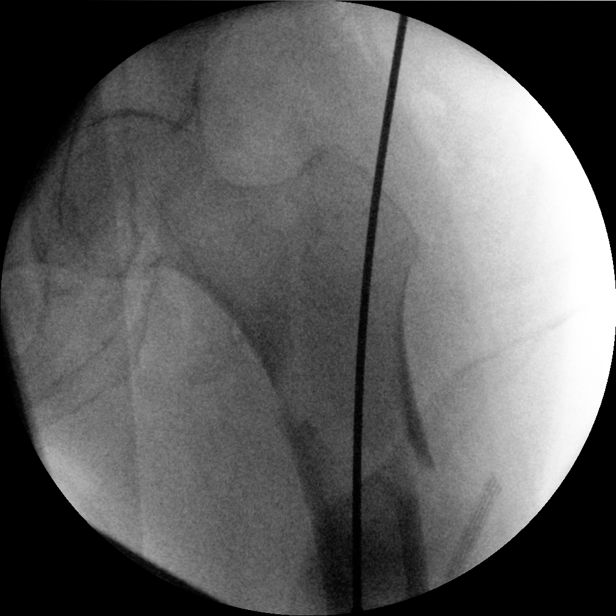

[Series 9: cont. · 1 of 1 slices shown (6 of 14)]
[im 1/1]
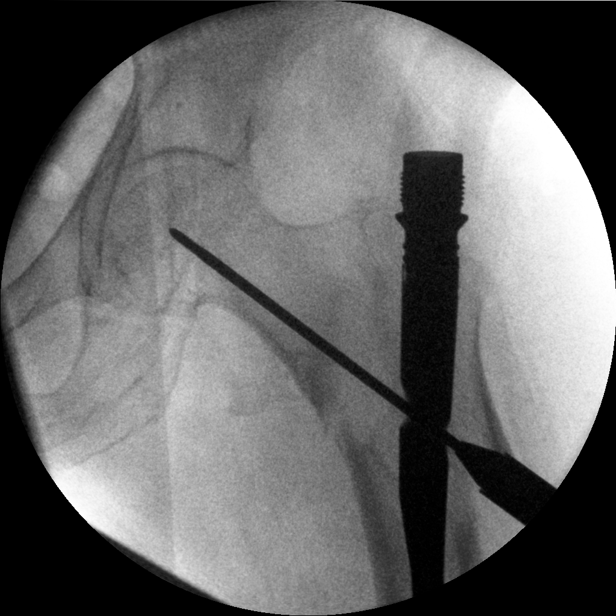

[Series 10: cont. · 1 of 1 slices shown (7 of 14)]
[im 1/1]
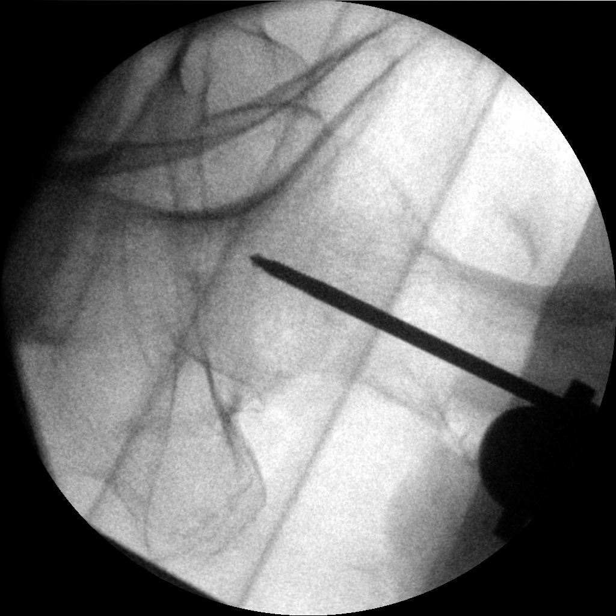

[Series 12: cont. · 1 of 1 slices shown (8 of 14)]
[im 1/1]
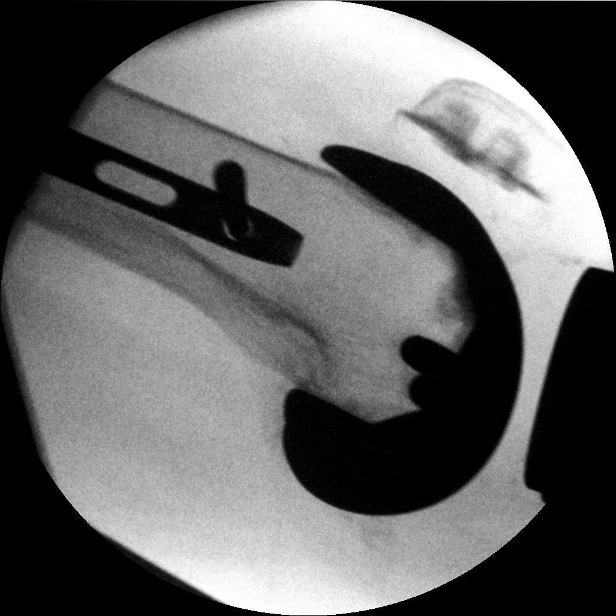

[Series 13: cont. · 1 of 1 slices shown (9 of 14)]
[im 1/1]
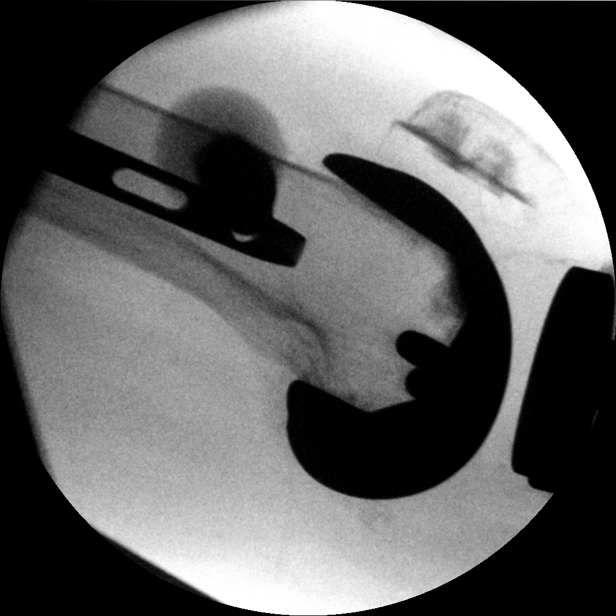

[Series 15: cont. · 1 of 1 slices shown (10 of 14)]
[im 1/1]
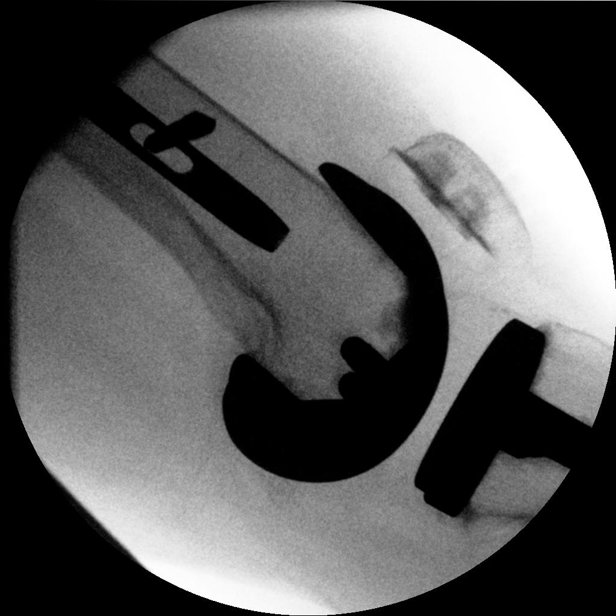

[Series 16: cont. · 1 of 1 slices shown (11 of 14)]
[im 1/1]
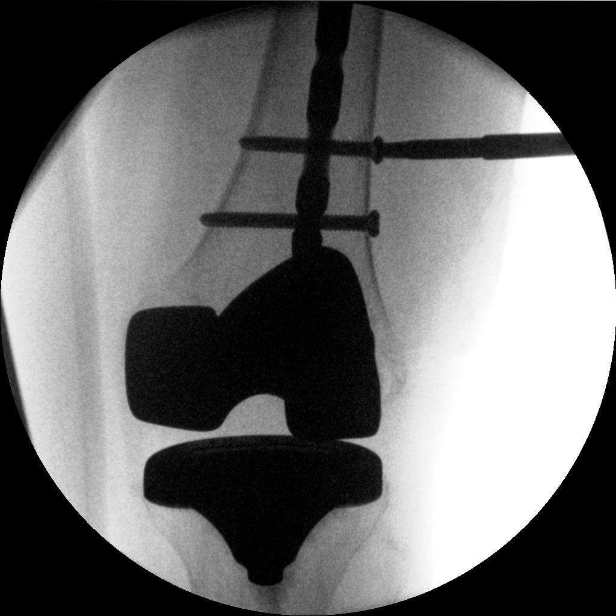

[Series 18: cont. · 1 of 1 slices shown (12 of 14)]
[im 1/1]
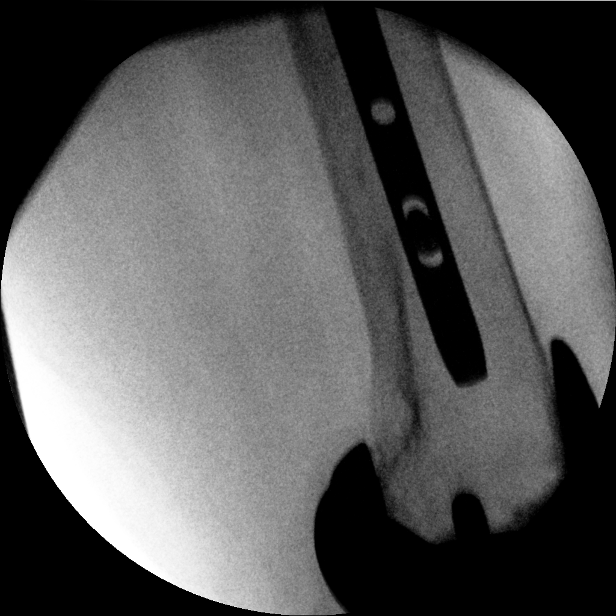

[Series 19: cont. · 1 of 1 slices shown (13 of 14)]
[im 1/1]
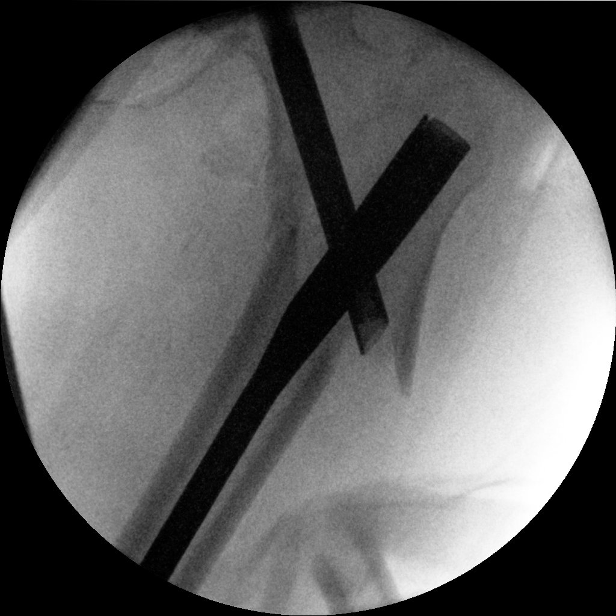

[Series 21: cont. · 1 of 1 slices shown (14 of 14)]
[im 1/1]
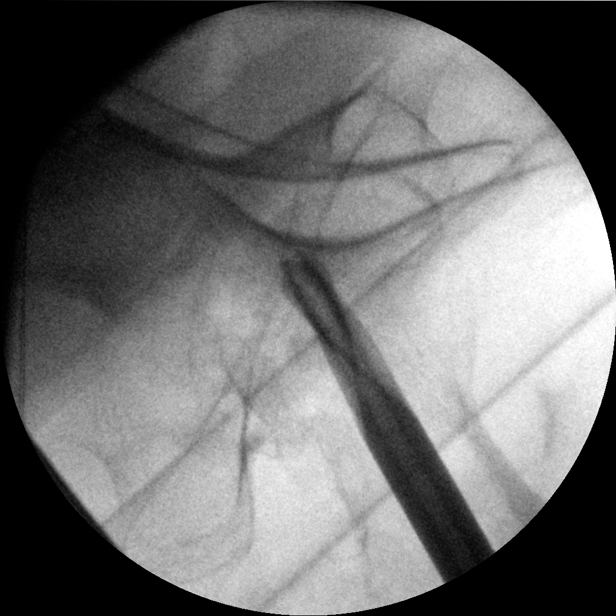

[14 of 21 positions shown; findings below may reference images not displayed]

FINDINGS: Intraoperative fluoroscopic spot images show placement of an
intramedullary rod within the proximal right femur and dynamic
compression screw traversing the right femoral neck. Hardware
appears intact and appropriately positioned. Distal locking screws
appear intact and appropriately positioned.

Fluoroscopy was provided for 1 minute 17 seconds.
IMPRESSION: Intraoperative fluoroscopic spot images showing placement of
intramedullary rod and dynamic compression screw traversing the
right femoral neck. No evidence of surgical complicating feature.

## 2018-10-16 DIAGNOSIS — G894 Chronic pain syndrome: Secondary | ICD-10-CM | POA: Diagnosis not present

## 2018-10-16 DIAGNOSIS — I10 Essential (primary) hypertension: Secondary | ICD-10-CM | POA: Diagnosis not present

## 2018-10-16 DIAGNOSIS — R112 Nausea with vomiting, unspecified: Secondary | ICD-10-CM | POA: Diagnosis not present

## 2018-10-16 DIAGNOSIS — F0390 Unspecified dementia without behavioral disturbance: Secondary | ICD-10-CM | POA: Diagnosis not present

## 2018-10-16 DIAGNOSIS — F419 Anxiety disorder, unspecified: Secondary | ICD-10-CM | POA: Diagnosis not present

## 2018-10-20 DIAGNOSIS — R3989 Other symptoms and signs involving the genitourinary system: Secondary | ICD-10-CM | POA: Diagnosis not present

## 2018-10-20 DIAGNOSIS — K219 Gastro-esophageal reflux disease without esophagitis: Secondary | ICD-10-CM | POA: Diagnosis not present

## 2018-10-20 DIAGNOSIS — F419 Anxiety disorder, unspecified: Secondary | ICD-10-CM | POA: Diagnosis not present

## 2018-10-20 DIAGNOSIS — I10 Essential (primary) hypertension: Secondary | ICD-10-CM | POA: Diagnosis not present

## 2018-10-20 DIAGNOSIS — F0390 Unspecified dementia without behavioral disturbance: Secondary | ICD-10-CM | POA: Diagnosis not present

## 2018-10-20 DIAGNOSIS — G894 Chronic pain syndrome: Secondary | ICD-10-CM | POA: Diagnosis not present

## 2018-10-21 DIAGNOSIS — N390 Urinary tract infection, site not specified: Secondary | ICD-10-CM | POA: Diagnosis not present

## 2018-10-22 DIAGNOSIS — G894 Chronic pain syndrome: Secondary | ICD-10-CM | POA: Diagnosis not present

## 2018-10-22 DIAGNOSIS — N3000 Acute cystitis without hematuria: Secondary | ICD-10-CM | POA: Diagnosis not present

## 2018-10-22 DIAGNOSIS — F419 Anxiety disorder, unspecified: Secondary | ICD-10-CM | POA: Diagnosis not present

## 2018-10-22 DIAGNOSIS — F0390 Unspecified dementia without behavioral disturbance: Secondary | ICD-10-CM | POA: Diagnosis not present

## 2018-10-22 DIAGNOSIS — Z79899 Other long term (current) drug therapy: Secondary | ICD-10-CM | POA: Diagnosis not present

## 2018-10-22 DIAGNOSIS — I10 Essential (primary) hypertension: Secondary | ICD-10-CM | POA: Diagnosis not present

## 2018-10-23 DIAGNOSIS — N3000 Acute cystitis without hematuria: Secondary | ICD-10-CM | POA: Diagnosis not present

## 2018-10-23 DIAGNOSIS — G894 Chronic pain syndrome: Secondary | ICD-10-CM | POA: Diagnosis not present

## 2018-10-23 DIAGNOSIS — I10 Essential (primary) hypertension: Secondary | ICD-10-CM | POA: Diagnosis not present

## 2018-10-23 DIAGNOSIS — F0390 Unspecified dementia without behavioral disturbance: Secondary | ICD-10-CM | POA: Diagnosis not present

## 2018-10-23 DIAGNOSIS — F419 Anxiety disorder, unspecified: Secondary | ICD-10-CM | POA: Diagnosis not present

## 2018-10-27 DIAGNOSIS — M6281 Muscle weakness (generalized): Secondary | ICD-10-CM | POA: Diagnosis not present

## 2018-10-27 DIAGNOSIS — R262 Difficulty in walking, not elsewhere classified: Secondary | ICD-10-CM | POA: Diagnosis not present

## 2018-10-27 DIAGNOSIS — R2681 Unsteadiness on feet: Secondary | ICD-10-CM | POA: Diagnosis not present

## 2018-11-11 ENCOUNTER — Ambulatory Visit (INDEPENDENT_AMBULATORY_CARE_PROVIDER_SITE_OTHER): Payer: PPO | Admitting: Vascular Surgery

## 2018-11-11 ENCOUNTER — Encounter (INDEPENDENT_AMBULATORY_CARE_PROVIDER_SITE_OTHER): Payer: PPO

## 2018-11-12 DIAGNOSIS — I10 Essential (primary) hypertension: Secondary | ICD-10-CM | POA: Diagnosis not present

## 2018-11-12 DIAGNOSIS — K5903 Drug induced constipation: Secondary | ICD-10-CM | POA: Diagnosis not present

## 2018-11-12 DIAGNOSIS — Z79899 Other long term (current) drug therapy: Secondary | ICD-10-CM | POA: Diagnosis not present

## 2018-11-12 DIAGNOSIS — G894 Chronic pain syndrome: Secondary | ICD-10-CM | POA: Diagnosis not present

## 2018-11-17 DIAGNOSIS — Z79899 Other long term (current) drug therapy: Secondary | ICD-10-CM | POA: Diagnosis not present

## 2018-11-17 DIAGNOSIS — G894 Chronic pain syndrome: Secondary | ICD-10-CM | POA: Diagnosis not present

## 2018-11-17 DIAGNOSIS — I10 Essential (primary) hypertension: Secondary | ICD-10-CM | POA: Diagnosis not present

## 2018-11-17 DIAGNOSIS — G309 Alzheimer's disease, unspecified: Secondary | ICD-10-CM | POA: Diagnosis not present

## 2018-11-17 DIAGNOSIS — F0280 Dementia in other diseases classified elsewhere without behavioral disturbance: Secondary | ICD-10-CM | POA: Diagnosis not present

## 2018-11-17 DIAGNOSIS — F419 Anxiety disorder, unspecified: Secondary | ICD-10-CM | POA: Diagnosis not present

## 2018-11-26 DIAGNOSIS — M6281 Muscle weakness (generalized): Secondary | ICD-10-CM | POA: Diagnosis not present

## 2018-11-26 DIAGNOSIS — R262 Difficulty in walking, not elsewhere classified: Secondary | ICD-10-CM | POA: Diagnosis not present

## 2018-11-26 DIAGNOSIS — R2681 Unsteadiness on feet: Secondary | ICD-10-CM | POA: Diagnosis not present

## 2018-12-09 DIAGNOSIS — Z79899 Other long term (current) drug therapy: Secondary | ICD-10-CM | POA: Diagnosis not present

## 2018-12-11 DIAGNOSIS — Z79899 Other long term (current) drug therapy: Secondary | ICD-10-CM | POA: Diagnosis not present

## 2018-12-15 DIAGNOSIS — F0390 Unspecified dementia without behavioral disturbance: Secondary | ICD-10-CM | POA: Diagnosis not present

## 2018-12-15 DIAGNOSIS — Z79899 Other long term (current) drug therapy: Secondary | ICD-10-CM | POA: Diagnosis not present

## 2018-12-15 DIAGNOSIS — G894 Chronic pain syndrome: Secondary | ICD-10-CM | POA: Diagnosis not present

## 2018-12-15 DIAGNOSIS — F419 Anxiety disorder, unspecified: Secondary | ICD-10-CM | POA: Diagnosis not present

## 2018-12-15 DIAGNOSIS — I10 Essential (primary) hypertension: Secondary | ICD-10-CM | POA: Diagnosis not present

## 2018-12-15 DIAGNOSIS — R269 Unspecified abnormalities of gait and mobility: Secondary | ICD-10-CM | POA: Diagnosis not present

## 2018-12-17 DIAGNOSIS — G894 Chronic pain syndrome: Secondary | ICD-10-CM | POA: Diagnosis not present

## 2018-12-17 DIAGNOSIS — F419 Anxiety disorder, unspecified: Secondary | ICD-10-CM | POA: Diagnosis not present

## 2018-12-17 DIAGNOSIS — Z79899 Other long term (current) drug therapy: Secondary | ICD-10-CM | POA: Diagnosis not present

## 2018-12-17 DIAGNOSIS — I10 Essential (primary) hypertension: Secondary | ICD-10-CM | POA: Diagnosis not present

## 2018-12-17 DIAGNOSIS — D649 Anemia, unspecified: Secondary | ICD-10-CM | POA: Diagnosis not present

## 2018-12-22 DIAGNOSIS — R293 Abnormal posture: Secondary | ICD-10-CM | POA: Diagnosis not present

## 2018-12-22 DIAGNOSIS — R278 Other lack of coordination: Secondary | ICD-10-CM | POA: Diagnosis not present

## 2018-12-22 DIAGNOSIS — M6281 Muscle weakness (generalized): Secondary | ICD-10-CM | POA: Diagnosis not present

## 2019-01-02 DIAGNOSIS — G894 Chronic pain syndrome: Secondary | ICD-10-CM | POA: Diagnosis not present

## 2019-01-05 DIAGNOSIS — R278 Other lack of coordination: Secondary | ICD-10-CM | POA: Diagnosis not present

## 2019-01-05 DIAGNOSIS — M6281 Muscle weakness (generalized): Secondary | ICD-10-CM | POA: Diagnosis not present

## 2019-01-05 DIAGNOSIS — R293 Abnormal posture: Secondary | ICD-10-CM | POA: Diagnosis not present

## 2019-01-07 DIAGNOSIS — R2689 Other abnormalities of gait and mobility: Secondary | ICD-10-CM | POA: Diagnosis not present

## 2019-01-07 DIAGNOSIS — M6281 Muscle weakness (generalized): Secondary | ICD-10-CM | POA: Diagnosis not present

## 2019-01-07 DIAGNOSIS — R278 Other lack of coordination: Secondary | ICD-10-CM | POA: Diagnosis not present

## 2019-01-07 DIAGNOSIS — R293 Abnormal posture: Secondary | ICD-10-CM | POA: Diagnosis not present

## 2019-01-09 DIAGNOSIS — M6281 Muscle weakness (generalized): Secondary | ICD-10-CM | POA: Diagnosis not present

## 2019-01-09 DIAGNOSIS — R2689 Other abnormalities of gait and mobility: Secondary | ICD-10-CM | POA: Diagnosis not present

## 2019-01-12 DIAGNOSIS — R293 Abnormal posture: Secondary | ICD-10-CM | POA: Diagnosis not present

## 2019-01-12 DIAGNOSIS — R2689 Other abnormalities of gait and mobility: Secondary | ICD-10-CM | POA: Diagnosis not present

## 2019-01-12 DIAGNOSIS — M6281 Muscle weakness (generalized): Secondary | ICD-10-CM | POA: Diagnosis not present

## 2019-01-12 DIAGNOSIS — R278 Other lack of coordination: Secondary | ICD-10-CM | POA: Diagnosis not present

## 2019-01-13 DIAGNOSIS — D649 Anemia, unspecified: Secondary | ICD-10-CM | POA: Diagnosis not present

## 2019-01-14 DIAGNOSIS — M6281 Muscle weakness (generalized): Secondary | ICD-10-CM | POA: Diagnosis not present

## 2019-01-14 DIAGNOSIS — R293 Abnormal posture: Secondary | ICD-10-CM | POA: Diagnosis not present

## 2019-01-14 DIAGNOSIS — R2689 Other abnormalities of gait and mobility: Secondary | ICD-10-CM | POA: Diagnosis not present

## 2019-01-14 DIAGNOSIS — R278 Other lack of coordination: Secondary | ICD-10-CM | POA: Diagnosis not present

## 2019-01-15 DIAGNOSIS — D649 Anemia, unspecified: Secondary | ICD-10-CM | POA: Diagnosis not present

## 2019-01-16 DIAGNOSIS — R2689 Other abnormalities of gait and mobility: Secondary | ICD-10-CM | POA: Diagnosis not present

## 2019-01-16 DIAGNOSIS — M6281 Muscle weakness (generalized): Secondary | ICD-10-CM | POA: Diagnosis not present

## 2019-01-19 DIAGNOSIS — R293 Abnormal posture: Secondary | ICD-10-CM | POA: Diagnosis not present

## 2019-01-19 DIAGNOSIS — M6281 Muscle weakness (generalized): Secondary | ICD-10-CM | POA: Diagnosis not present

## 2019-01-19 DIAGNOSIS — R278 Other lack of coordination: Secondary | ICD-10-CM | POA: Diagnosis not present

## 2019-01-21 DIAGNOSIS — R278 Other lack of coordination: Secondary | ICD-10-CM | POA: Diagnosis not present

## 2019-01-21 DIAGNOSIS — F0390 Unspecified dementia without behavioral disturbance: Secondary | ICD-10-CM | POA: Diagnosis not present

## 2019-01-21 DIAGNOSIS — G894 Chronic pain syndrome: Secondary | ICD-10-CM | POA: Diagnosis not present

## 2019-01-21 DIAGNOSIS — F419 Anxiety disorder, unspecified: Secondary | ICD-10-CM | POA: Diagnosis not present

## 2019-01-21 DIAGNOSIS — M6281 Muscle weakness (generalized): Secondary | ICD-10-CM | POA: Diagnosis not present

## 2019-01-21 DIAGNOSIS — R2689 Other abnormalities of gait and mobility: Secondary | ICD-10-CM | POA: Diagnosis not present

## 2019-01-21 DIAGNOSIS — Z79899 Other long term (current) drug therapy: Secondary | ICD-10-CM | POA: Diagnosis not present

## 2019-01-21 DIAGNOSIS — R293 Abnormal posture: Secondary | ICD-10-CM | POA: Diagnosis not present

## 2019-01-21 DIAGNOSIS — I10 Essential (primary) hypertension: Secondary | ICD-10-CM | POA: Diagnosis not present

## 2019-01-23 DIAGNOSIS — R2689 Other abnormalities of gait and mobility: Secondary | ICD-10-CM | POA: Diagnosis not present

## 2019-01-23 DIAGNOSIS — M6281 Muscle weakness (generalized): Secondary | ICD-10-CM | POA: Diagnosis not present

## 2019-01-26 DIAGNOSIS — M6281 Muscle weakness (generalized): Secondary | ICD-10-CM | POA: Diagnosis not present

## 2019-01-26 DIAGNOSIS — R2689 Other abnormalities of gait and mobility: Secondary | ICD-10-CM | POA: Diagnosis not present

## 2019-01-26 DIAGNOSIS — R278 Other lack of coordination: Secondary | ICD-10-CM | POA: Diagnosis not present

## 2019-01-26 DIAGNOSIS — R293 Abnormal posture: Secondary | ICD-10-CM | POA: Diagnosis not present

## 2019-01-28 DIAGNOSIS — R278 Other lack of coordination: Secondary | ICD-10-CM | POA: Diagnosis not present

## 2019-01-28 DIAGNOSIS — R293 Abnormal posture: Secondary | ICD-10-CM | POA: Diagnosis not present

## 2019-01-28 DIAGNOSIS — R2689 Other abnormalities of gait and mobility: Secondary | ICD-10-CM | POA: Diagnosis not present

## 2019-01-28 DIAGNOSIS — M6281 Muscle weakness (generalized): Secondary | ICD-10-CM | POA: Diagnosis not present

## 2019-01-30 DIAGNOSIS — M6281 Muscle weakness (generalized): Secondary | ICD-10-CM | POA: Diagnosis not present

## 2019-01-30 DIAGNOSIS — R2689 Other abnormalities of gait and mobility: Secondary | ICD-10-CM | POA: Diagnosis not present

## 2019-02-02 DIAGNOSIS — R293 Abnormal posture: Secondary | ICD-10-CM | POA: Diagnosis not present

## 2019-02-02 DIAGNOSIS — R2689 Other abnormalities of gait and mobility: Secondary | ICD-10-CM | POA: Diagnosis not present

## 2019-02-02 DIAGNOSIS — M6281 Muscle weakness (generalized): Secondary | ICD-10-CM | POA: Diagnosis not present

## 2019-02-02 DIAGNOSIS — R278 Other lack of coordination: Secondary | ICD-10-CM | POA: Diagnosis not present

## 2019-02-04 DIAGNOSIS — Z79899 Other long term (current) drug therapy: Secondary | ICD-10-CM | POA: Diagnosis not present

## 2019-02-04 DIAGNOSIS — R269 Unspecified abnormalities of gait and mobility: Secondary | ICD-10-CM | POA: Diagnosis not present

## 2019-02-04 DIAGNOSIS — I10 Essential (primary) hypertension: Secondary | ICD-10-CM | POA: Diagnosis not present

## 2019-02-04 DIAGNOSIS — G894 Chronic pain syndrome: Secondary | ICD-10-CM | POA: Diagnosis not present

## 2019-02-04 DIAGNOSIS — M6281 Muscle weakness (generalized): Secondary | ICD-10-CM | POA: Diagnosis not present

## 2019-02-04 DIAGNOSIS — F419 Anxiety disorder, unspecified: Secondary | ICD-10-CM | POA: Diagnosis not present

## 2019-02-04 DIAGNOSIS — R278 Other lack of coordination: Secondary | ICD-10-CM | POA: Diagnosis not present

## 2019-02-04 DIAGNOSIS — R2689 Other abnormalities of gait and mobility: Secondary | ICD-10-CM | POA: Diagnosis not present

## 2019-02-04 DIAGNOSIS — R293 Abnormal posture: Secondary | ICD-10-CM | POA: Diagnosis not present

## 2019-02-06 DIAGNOSIS — R2689 Other abnormalities of gait and mobility: Secondary | ICD-10-CM | POA: Diagnosis not present

## 2019-02-06 DIAGNOSIS — M6281 Muscle weakness (generalized): Secondary | ICD-10-CM | POA: Diagnosis not present

## 2019-02-09 DIAGNOSIS — R293 Abnormal posture: Secondary | ICD-10-CM | POA: Diagnosis not present

## 2019-02-09 DIAGNOSIS — R278 Other lack of coordination: Secondary | ICD-10-CM | POA: Diagnosis not present

## 2019-02-09 DIAGNOSIS — R2689 Other abnormalities of gait and mobility: Secondary | ICD-10-CM | POA: Diagnosis not present

## 2019-02-09 DIAGNOSIS — M6281 Muscle weakness (generalized): Secondary | ICD-10-CM | POA: Diagnosis not present

## 2019-02-11 DIAGNOSIS — M6281 Muscle weakness (generalized): Secondary | ICD-10-CM | POA: Diagnosis not present

## 2019-02-11 DIAGNOSIS — R2689 Other abnormalities of gait and mobility: Secondary | ICD-10-CM | POA: Diagnosis not present

## 2019-02-11 DIAGNOSIS — R278 Other lack of coordination: Secondary | ICD-10-CM | POA: Diagnosis not present

## 2019-02-11 DIAGNOSIS — R293 Abnormal posture: Secondary | ICD-10-CM | POA: Diagnosis not present

## 2019-02-12 DIAGNOSIS — R269 Unspecified abnormalities of gait and mobility: Secondary | ICD-10-CM | POA: Diagnosis not present

## 2019-02-12 DIAGNOSIS — Z79899 Other long term (current) drug therapy: Secondary | ICD-10-CM | POA: Diagnosis not present

## 2019-02-12 DIAGNOSIS — I10 Essential (primary) hypertension: Secondary | ICD-10-CM | POA: Diagnosis not present

## 2019-02-12 DIAGNOSIS — G894 Chronic pain syndrome: Secondary | ICD-10-CM | POA: Diagnosis not present

## 2019-02-12 DIAGNOSIS — F419 Anxiety disorder, unspecified: Secondary | ICD-10-CM | POA: Diagnosis not present

## 2019-02-13 DIAGNOSIS — M6281 Muscle weakness (generalized): Secondary | ICD-10-CM | POA: Diagnosis not present

## 2019-02-13 DIAGNOSIS — R2689 Other abnormalities of gait and mobility: Secondary | ICD-10-CM | POA: Diagnosis not present

## 2019-02-16 DIAGNOSIS — R2689 Other abnormalities of gait and mobility: Secondary | ICD-10-CM | POA: Diagnosis not present

## 2019-02-16 DIAGNOSIS — M6281 Muscle weakness (generalized): Secondary | ICD-10-CM | POA: Diagnosis not present

## 2019-02-17 DIAGNOSIS — D649 Anemia, unspecified: Secondary | ICD-10-CM | POA: Diagnosis not present

## 2019-02-17 DIAGNOSIS — Z79899 Other long term (current) drug therapy: Secondary | ICD-10-CM | POA: Diagnosis not present

## 2019-02-17 DIAGNOSIS — I10 Essential (primary) hypertension: Secondary | ICD-10-CM | POA: Diagnosis not present

## 2019-02-18 DIAGNOSIS — R2689 Other abnormalities of gait and mobility: Secondary | ICD-10-CM | POA: Diagnosis not present

## 2019-02-18 DIAGNOSIS — M6281 Muscle weakness (generalized): Secondary | ICD-10-CM | POA: Diagnosis not present

## 2019-02-18 DIAGNOSIS — R293 Abnormal posture: Secondary | ICD-10-CM | POA: Diagnosis not present

## 2019-02-18 DIAGNOSIS — R278 Other lack of coordination: Secondary | ICD-10-CM | POA: Diagnosis not present

## 2019-02-20 DIAGNOSIS — M6281 Muscle weakness (generalized): Secondary | ICD-10-CM | POA: Diagnosis not present

## 2019-02-20 DIAGNOSIS — R2689 Other abnormalities of gait and mobility: Secondary | ICD-10-CM | POA: Diagnosis not present

## 2019-02-22 DIAGNOSIS — M6281 Muscle weakness (generalized): Secondary | ICD-10-CM | POA: Diagnosis not present

## 2019-02-22 DIAGNOSIS — R293 Abnormal posture: Secondary | ICD-10-CM | POA: Diagnosis not present

## 2019-02-22 DIAGNOSIS — R278 Other lack of coordination: Secondary | ICD-10-CM | POA: Diagnosis not present

## 2019-02-23 DIAGNOSIS — M6281 Muscle weakness (generalized): Secondary | ICD-10-CM | POA: Diagnosis not present

## 2019-02-23 DIAGNOSIS — R293 Abnormal posture: Secondary | ICD-10-CM | POA: Diagnosis not present

## 2019-02-23 DIAGNOSIS — R278 Other lack of coordination: Secondary | ICD-10-CM | POA: Diagnosis not present

## 2019-02-24 DIAGNOSIS — R2689 Other abnormalities of gait and mobility: Secondary | ICD-10-CM | POA: Diagnosis not present

## 2019-02-24 DIAGNOSIS — M6281 Muscle weakness (generalized): Secondary | ICD-10-CM | POA: Diagnosis not present

## 2019-02-25 DIAGNOSIS — R278 Other lack of coordination: Secondary | ICD-10-CM | POA: Diagnosis not present

## 2019-02-25 DIAGNOSIS — R293 Abnormal posture: Secondary | ICD-10-CM | POA: Diagnosis not present

## 2019-02-25 DIAGNOSIS — M6281 Muscle weakness (generalized): Secondary | ICD-10-CM | POA: Diagnosis not present

## 2019-02-25 DIAGNOSIS — R2689 Other abnormalities of gait and mobility: Secondary | ICD-10-CM | POA: Diagnosis not present

## 2019-02-27 DIAGNOSIS — R2689 Other abnormalities of gait and mobility: Secondary | ICD-10-CM | POA: Diagnosis not present

## 2019-02-27 DIAGNOSIS — M6281 Muscle weakness (generalized): Secondary | ICD-10-CM | POA: Diagnosis not present

## 2019-03-02 DIAGNOSIS — R2689 Other abnormalities of gait and mobility: Secondary | ICD-10-CM | POA: Diagnosis not present

## 2019-03-02 DIAGNOSIS — R278 Other lack of coordination: Secondary | ICD-10-CM | POA: Diagnosis not present

## 2019-03-02 DIAGNOSIS — M6281 Muscle weakness (generalized): Secondary | ICD-10-CM | POA: Diagnosis not present

## 2019-03-02 DIAGNOSIS — R293 Abnormal posture: Secondary | ICD-10-CM | POA: Diagnosis not present

## 2019-03-04 DIAGNOSIS — R2689 Other abnormalities of gait and mobility: Secondary | ICD-10-CM | POA: Diagnosis not present

## 2019-03-04 DIAGNOSIS — M6281 Muscle weakness (generalized): Secondary | ICD-10-CM | POA: Diagnosis not present

## 2019-03-04 DIAGNOSIS — R293 Abnormal posture: Secondary | ICD-10-CM | POA: Diagnosis not present

## 2019-03-04 DIAGNOSIS — R278 Other lack of coordination: Secondary | ICD-10-CM | POA: Diagnosis not present

## 2019-03-06 DIAGNOSIS — R2689 Other abnormalities of gait and mobility: Secondary | ICD-10-CM | POA: Diagnosis not present

## 2019-03-06 DIAGNOSIS — M6281 Muscle weakness (generalized): Secondary | ICD-10-CM | POA: Diagnosis not present

## 2019-03-09 DIAGNOSIS — R293 Abnormal posture: Secondary | ICD-10-CM | POA: Diagnosis not present

## 2019-03-09 DIAGNOSIS — R278 Other lack of coordination: Secondary | ICD-10-CM | POA: Diagnosis not present

## 2019-03-09 DIAGNOSIS — R2689 Other abnormalities of gait and mobility: Secondary | ICD-10-CM | POA: Diagnosis not present

## 2019-03-09 DIAGNOSIS — M6281 Muscle weakness (generalized): Secondary | ICD-10-CM | POA: Diagnosis not present

## 2019-03-10 DIAGNOSIS — M6281 Muscle weakness (generalized): Secondary | ICD-10-CM | POA: Diagnosis not present

## 2019-03-10 DIAGNOSIS — R278 Other lack of coordination: Secondary | ICD-10-CM | POA: Diagnosis not present

## 2019-03-10 DIAGNOSIS — R293 Abnormal posture: Secondary | ICD-10-CM | POA: Diagnosis not present

## 2019-03-11 DIAGNOSIS — D501 Sideropenic dysphagia: Secondary | ICD-10-CM | POA: Diagnosis not present

## 2019-03-11 DIAGNOSIS — R2689 Other abnormalities of gait and mobility: Secondary | ICD-10-CM | POA: Diagnosis not present

## 2019-03-11 DIAGNOSIS — M6281 Muscle weakness (generalized): Secondary | ICD-10-CM | POA: Diagnosis not present

## 2019-03-11 DIAGNOSIS — I10 Essential (primary) hypertension: Secondary | ICD-10-CM | POA: Diagnosis not present

## 2019-03-11 DIAGNOSIS — G894 Chronic pain syndrome: Secondary | ICD-10-CM | POA: Diagnosis not present

## 2019-03-11 DIAGNOSIS — Z79899 Other long term (current) drug therapy: Secondary | ICD-10-CM | POA: Diagnosis not present

## 2019-03-11 DIAGNOSIS — F419 Anxiety disorder, unspecified: Secondary | ICD-10-CM | POA: Diagnosis not present

## 2019-03-13 DIAGNOSIS — M6281 Muscle weakness (generalized): Secondary | ICD-10-CM | POA: Diagnosis not present

## 2019-03-13 DIAGNOSIS — R2689 Other abnormalities of gait and mobility: Secondary | ICD-10-CM | POA: Diagnosis not present

## 2019-03-16 DIAGNOSIS — R293 Abnormal posture: Secondary | ICD-10-CM | POA: Diagnosis not present

## 2019-03-16 DIAGNOSIS — R278 Other lack of coordination: Secondary | ICD-10-CM | POA: Diagnosis not present

## 2019-03-16 DIAGNOSIS — M6281 Muscle weakness (generalized): Secondary | ICD-10-CM | POA: Diagnosis not present

## 2019-03-16 DIAGNOSIS — R2689 Other abnormalities of gait and mobility: Secondary | ICD-10-CM | POA: Diagnosis not present

## 2019-03-18 DIAGNOSIS — M6281 Muscle weakness (generalized): Secondary | ICD-10-CM | POA: Diagnosis not present

## 2019-03-18 DIAGNOSIS — I10 Essential (primary) hypertension: Secondary | ICD-10-CM | POA: Diagnosis not present

## 2019-03-18 DIAGNOSIS — R293 Abnormal posture: Secondary | ICD-10-CM | POA: Diagnosis not present

## 2019-03-18 DIAGNOSIS — R278 Other lack of coordination: Secondary | ICD-10-CM | POA: Diagnosis not present

## 2019-03-18 DIAGNOSIS — G894 Chronic pain syndrome: Secondary | ICD-10-CM | POA: Diagnosis not present

## 2019-03-18 DIAGNOSIS — Z79899 Other long term (current) drug therapy: Secondary | ICD-10-CM | POA: Diagnosis not present

## 2019-03-18 DIAGNOSIS — R609 Edema, unspecified: Secondary | ICD-10-CM | POA: Diagnosis not present

## 2019-03-18 DIAGNOSIS — F0390 Unspecified dementia without behavioral disturbance: Secondary | ICD-10-CM | POA: Diagnosis not present

## 2019-03-18 DIAGNOSIS — R2689 Other abnormalities of gait and mobility: Secondary | ICD-10-CM | POA: Diagnosis not present

## 2019-03-18 DIAGNOSIS — Z993 Dependence on wheelchair: Secondary | ICD-10-CM | POA: Diagnosis not present

## 2019-03-19 DIAGNOSIS — I10 Essential (primary) hypertension: Secondary | ICD-10-CM | POA: Diagnosis not present

## 2019-03-19 DIAGNOSIS — R609 Edema, unspecified: Secondary | ICD-10-CM | POA: Diagnosis not present

## 2019-03-19 DIAGNOSIS — Z79899 Other long term (current) drug therapy: Secondary | ICD-10-CM | POA: Diagnosis not present

## 2019-03-20 DIAGNOSIS — M6281 Muscle weakness (generalized): Secondary | ICD-10-CM | POA: Diagnosis not present

## 2019-03-20 DIAGNOSIS — R2689 Other abnormalities of gait and mobility: Secondary | ICD-10-CM | POA: Diagnosis not present

## 2019-03-23 DIAGNOSIS — R293 Abnormal posture: Secondary | ICD-10-CM | POA: Diagnosis not present

## 2019-03-23 DIAGNOSIS — R278 Other lack of coordination: Secondary | ICD-10-CM | POA: Diagnosis not present

## 2019-03-23 DIAGNOSIS — M6281 Muscle weakness (generalized): Secondary | ICD-10-CM | POA: Diagnosis not present

## 2019-03-23 DIAGNOSIS — R2689 Other abnormalities of gait and mobility: Secondary | ICD-10-CM | POA: Diagnosis not present

## 2019-03-25 DIAGNOSIS — R2689 Other abnormalities of gait and mobility: Secondary | ICD-10-CM | POA: Diagnosis not present

## 2019-03-25 DIAGNOSIS — R293 Abnormal posture: Secondary | ICD-10-CM | POA: Diagnosis not present

## 2019-03-25 DIAGNOSIS — R278 Other lack of coordination: Secondary | ICD-10-CM | POA: Diagnosis not present

## 2019-03-25 DIAGNOSIS — M6281 Muscle weakness (generalized): Secondary | ICD-10-CM | POA: Diagnosis not present

## 2019-03-27 DIAGNOSIS — R2689 Other abnormalities of gait and mobility: Secondary | ICD-10-CM | POA: Diagnosis not present

## 2019-03-27 DIAGNOSIS — M6281 Muscle weakness (generalized): Secondary | ICD-10-CM | POA: Diagnosis not present

## 2019-03-30 DIAGNOSIS — R278 Other lack of coordination: Secondary | ICD-10-CM | POA: Diagnosis not present

## 2019-03-30 DIAGNOSIS — R293 Abnormal posture: Secondary | ICD-10-CM | POA: Diagnosis not present

## 2019-03-30 DIAGNOSIS — R2689 Other abnormalities of gait and mobility: Secondary | ICD-10-CM | POA: Diagnosis not present

## 2019-03-30 DIAGNOSIS — M6281 Muscle weakness (generalized): Secondary | ICD-10-CM | POA: Diagnosis not present

## 2019-04-01 DIAGNOSIS — R2689 Other abnormalities of gait and mobility: Secondary | ICD-10-CM | POA: Diagnosis not present

## 2019-04-01 DIAGNOSIS — M6281 Muscle weakness (generalized): Secondary | ICD-10-CM | POA: Diagnosis not present

## 2019-04-01 DIAGNOSIS — D509 Iron deficiency anemia, unspecified: Secondary | ICD-10-CM | POA: Diagnosis not present

## 2019-04-01 DIAGNOSIS — Z79899 Other long term (current) drug therapy: Secondary | ICD-10-CM | POA: Diagnosis not present

## 2019-04-01 DIAGNOSIS — I10 Essential (primary) hypertension: Secondary | ICD-10-CM | POA: Diagnosis not present

## 2019-04-01 DIAGNOSIS — G894 Chronic pain syndrome: Secondary | ICD-10-CM | POA: Diagnosis not present

## 2019-04-01 DIAGNOSIS — F419 Anxiety disorder, unspecified: Secondary | ICD-10-CM | POA: Diagnosis not present

## 2019-04-03 DIAGNOSIS — M6281 Muscle weakness (generalized): Secondary | ICD-10-CM | POA: Diagnosis not present

## 2019-04-03 DIAGNOSIS — R2689 Other abnormalities of gait and mobility: Secondary | ICD-10-CM | POA: Diagnosis not present

## 2019-04-04 DIAGNOSIS — R278 Other lack of coordination: Secondary | ICD-10-CM | POA: Diagnosis not present

## 2019-04-04 DIAGNOSIS — R293 Abnormal posture: Secondary | ICD-10-CM | POA: Diagnosis not present

## 2019-04-04 DIAGNOSIS — M6281 Muscle weakness (generalized): Secondary | ICD-10-CM | POA: Diagnosis not present

## 2019-04-06 DIAGNOSIS — R2689 Other abnormalities of gait and mobility: Secondary | ICD-10-CM | POA: Diagnosis not present

## 2019-04-06 DIAGNOSIS — R278 Other lack of coordination: Secondary | ICD-10-CM | POA: Diagnosis not present

## 2019-04-06 DIAGNOSIS — R293 Abnormal posture: Secondary | ICD-10-CM | POA: Diagnosis not present

## 2019-04-06 DIAGNOSIS — M6281 Muscle weakness (generalized): Secondary | ICD-10-CM | POA: Diagnosis not present

## 2019-04-08 DIAGNOSIS — I10 Essential (primary) hypertension: Secondary | ICD-10-CM | POA: Diagnosis not present

## 2019-04-08 DIAGNOSIS — Z79899 Other long term (current) drug therapy: Secondary | ICD-10-CM | POA: Diagnosis not present

## 2019-04-09 DIAGNOSIS — R2689 Other abnormalities of gait and mobility: Secondary | ICD-10-CM | POA: Diagnosis not present

## 2019-04-09 DIAGNOSIS — M6281 Muscle weakness (generalized): Secondary | ICD-10-CM | POA: Diagnosis not present

## 2019-04-10 DIAGNOSIS — R2689 Other abnormalities of gait and mobility: Secondary | ICD-10-CM | POA: Diagnosis not present

## 2019-04-10 DIAGNOSIS — M6281 Muscle weakness (generalized): Secondary | ICD-10-CM | POA: Diagnosis not present

## 2019-04-13 DIAGNOSIS — M6281 Muscle weakness (generalized): Secondary | ICD-10-CM | POA: Diagnosis not present

## 2019-04-13 DIAGNOSIS — R278 Other lack of coordination: Secondary | ICD-10-CM | POA: Diagnosis not present

## 2019-04-13 DIAGNOSIS — R293 Abnormal posture: Secondary | ICD-10-CM | POA: Diagnosis not present

## 2019-04-14 DIAGNOSIS — R2689 Other abnormalities of gait and mobility: Secondary | ICD-10-CM | POA: Diagnosis not present

## 2019-04-14 DIAGNOSIS — M6281 Muscle weakness (generalized): Secondary | ICD-10-CM | POA: Diagnosis not present

## 2019-04-15 DIAGNOSIS — R278 Other lack of coordination: Secondary | ICD-10-CM | POA: Diagnosis not present

## 2019-04-15 DIAGNOSIS — R2689 Other abnormalities of gait and mobility: Secondary | ICD-10-CM | POA: Diagnosis not present

## 2019-04-15 DIAGNOSIS — R293 Abnormal posture: Secondary | ICD-10-CM | POA: Diagnosis not present

## 2019-04-15 DIAGNOSIS — M6281 Muscle weakness (generalized): Secondary | ICD-10-CM | POA: Diagnosis not present

## 2019-04-16 DIAGNOSIS — R293 Abnormal posture: Secondary | ICD-10-CM | POA: Diagnosis not present

## 2019-04-16 DIAGNOSIS — M6281 Muscle weakness (generalized): Secondary | ICD-10-CM | POA: Diagnosis not present

## 2019-04-16 DIAGNOSIS — R278 Other lack of coordination: Secondary | ICD-10-CM | POA: Diagnosis not present

## 2019-04-16 DIAGNOSIS — R2689 Other abnormalities of gait and mobility: Secondary | ICD-10-CM | POA: Diagnosis not present

## 2019-04-21 DIAGNOSIS — R278 Other lack of coordination: Secondary | ICD-10-CM | POA: Diagnosis not present

## 2019-04-21 DIAGNOSIS — R2689 Other abnormalities of gait and mobility: Secondary | ICD-10-CM | POA: Diagnosis not present

## 2019-04-21 DIAGNOSIS — R293 Abnormal posture: Secondary | ICD-10-CM | POA: Diagnosis not present

## 2019-04-21 DIAGNOSIS — M6281 Muscle weakness (generalized): Secondary | ICD-10-CM | POA: Diagnosis not present

## 2019-04-22 DIAGNOSIS — R2689 Other abnormalities of gait and mobility: Secondary | ICD-10-CM | POA: Diagnosis not present

## 2019-04-22 DIAGNOSIS — M6281 Muscle weakness (generalized): Secondary | ICD-10-CM | POA: Diagnosis not present

## 2019-04-23 DIAGNOSIS — R278 Other lack of coordination: Secondary | ICD-10-CM | POA: Diagnosis not present

## 2019-04-23 DIAGNOSIS — M6281 Muscle weakness (generalized): Secondary | ICD-10-CM | POA: Diagnosis not present

## 2019-04-23 DIAGNOSIS — R2689 Other abnormalities of gait and mobility: Secondary | ICD-10-CM | POA: Diagnosis not present

## 2019-04-23 DIAGNOSIS — R293 Abnormal posture: Secondary | ICD-10-CM | POA: Diagnosis not present

## 2019-04-25 DIAGNOSIS — R293 Abnormal posture: Secondary | ICD-10-CM | POA: Diagnosis not present

## 2019-04-25 DIAGNOSIS — M6281 Muscle weakness (generalized): Secondary | ICD-10-CM | POA: Diagnosis not present

## 2019-04-25 DIAGNOSIS — R278 Other lack of coordination: Secondary | ICD-10-CM | POA: Diagnosis not present

## 2019-04-27 DIAGNOSIS — F0391 Unspecified dementia with behavioral disturbance: Secondary | ICD-10-CM | POA: Diagnosis not present

## 2019-04-27 DIAGNOSIS — R269 Unspecified abnormalities of gait and mobility: Secondary | ICD-10-CM | POA: Diagnosis not present

## 2019-04-27 DIAGNOSIS — I10 Essential (primary) hypertension: Secondary | ICD-10-CM | POA: Diagnosis not present

## 2019-04-27 DIAGNOSIS — M6281 Muscle weakness (generalized): Secondary | ICD-10-CM | POA: Diagnosis not present

## 2019-04-27 DIAGNOSIS — R2689 Other abnormalities of gait and mobility: Secondary | ICD-10-CM | POA: Diagnosis not present

## 2019-04-27 DIAGNOSIS — G894 Chronic pain syndrome: Secondary | ICD-10-CM | POA: Diagnosis not present

## 2019-04-27 DIAGNOSIS — F419 Anxiety disorder, unspecified: Secondary | ICD-10-CM | POA: Diagnosis not present

## 2019-04-27 DIAGNOSIS — R278 Other lack of coordination: Secondary | ICD-10-CM | POA: Diagnosis not present

## 2019-04-27 DIAGNOSIS — R293 Abnormal posture: Secondary | ICD-10-CM | POA: Diagnosis not present

## 2019-04-29 DIAGNOSIS — R278 Other lack of coordination: Secondary | ICD-10-CM | POA: Diagnosis not present

## 2019-04-29 DIAGNOSIS — M6281 Muscle weakness (generalized): Secondary | ICD-10-CM | POA: Diagnosis not present

## 2019-04-29 DIAGNOSIS — R2689 Other abnormalities of gait and mobility: Secondary | ICD-10-CM | POA: Diagnosis not present

## 2019-04-29 DIAGNOSIS — R293 Abnormal posture: Secondary | ICD-10-CM | POA: Diagnosis not present

## 2019-05-01 DIAGNOSIS — R2689 Other abnormalities of gait and mobility: Secondary | ICD-10-CM | POA: Diagnosis not present

## 2019-05-01 DIAGNOSIS — R278 Other lack of coordination: Secondary | ICD-10-CM | POA: Diagnosis not present

## 2019-05-01 DIAGNOSIS — R293 Abnormal posture: Secondary | ICD-10-CM | POA: Diagnosis not present

## 2019-05-01 DIAGNOSIS — M6281 Muscle weakness (generalized): Secondary | ICD-10-CM | POA: Diagnosis not present

## 2019-05-04 DIAGNOSIS — M6281 Muscle weakness (generalized): Secondary | ICD-10-CM | POA: Diagnosis not present

## 2019-05-04 DIAGNOSIS — R293 Abnormal posture: Secondary | ICD-10-CM | POA: Diagnosis not present

## 2019-05-04 DIAGNOSIS — R278 Other lack of coordination: Secondary | ICD-10-CM | POA: Diagnosis not present

## 2019-05-05 DIAGNOSIS — R2689 Other abnormalities of gait and mobility: Secondary | ICD-10-CM | POA: Diagnosis not present

## 2019-05-05 DIAGNOSIS — M6281 Muscle weakness (generalized): Secondary | ICD-10-CM | POA: Diagnosis not present

## 2019-05-06 DIAGNOSIS — M6281 Muscle weakness (generalized): Secondary | ICD-10-CM | POA: Diagnosis not present

## 2019-05-06 DIAGNOSIS — R2689 Other abnormalities of gait and mobility: Secondary | ICD-10-CM | POA: Diagnosis not present

## 2019-05-06 DIAGNOSIS — R278 Other lack of coordination: Secondary | ICD-10-CM | POA: Diagnosis not present

## 2019-05-06 DIAGNOSIS — R293 Abnormal posture: Secondary | ICD-10-CM | POA: Diagnosis not present

## 2019-05-08 DIAGNOSIS — R278 Other lack of coordination: Secondary | ICD-10-CM | POA: Diagnosis not present

## 2019-05-08 DIAGNOSIS — R2689 Other abnormalities of gait and mobility: Secondary | ICD-10-CM | POA: Diagnosis not present

## 2019-05-08 DIAGNOSIS — M6281 Muscle weakness (generalized): Secondary | ICD-10-CM | POA: Diagnosis not present

## 2019-05-08 DIAGNOSIS — R293 Abnormal posture: Secondary | ICD-10-CM | POA: Diagnosis not present

## 2019-05-11 DIAGNOSIS — R278 Other lack of coordination: Secondary | ICD-10-CM | POA: Diagnosis not present

## 2019-05-11 DIAGNOSIS — R2689 Other abnormalities of gait and mobility: Secondary | ICD-10-CM | POA: Diagnosis not present

## 2019-05-11 DIAGNOSIS — M6281 Muscle weakness (generalized): Secondary | ICD-10-CM | POA: Diagnosis not present

## 2019-05-11 DIAGNOSIS — R293 Abnormal posture: Secondary | ICD-10-CM | POA: Diagnosis not present

## 2019-05-13 DIAGNOSIS — M6281 Muscle weakness (generalized): Secondary | ICD-10-CM | POA: Diagnosis not present

## 2019-05-13 DIAGNOSIS — R2689 Other abnormalities of gait and mobility: Secondary | ICD-10-CM | POA: Diagnosis not present

## 2019-05-15 DIAGNOSIS — M6281 Muscle weakness (generalized): Secondary | ICD-10-CM | POA: Diagnosis not present

## 2019-05-15 DIAGNOSIS — R2689 Other abnormalities of gait and mobility: Secondary | ICD-10-CM | POA: Diagnosis not present

## 2019-05-18 DIAGNOSIS — M6281 Muscle weakness (generalized): Secondary | ICD-10-CM | POA: Diagnosis not present

## 2019-05-18 DIAGNOSIS — R2689 Other abnormalities of gait and mobility: Secondary | ICD-10-CM | POA: Diagnosis not present

## 2019-05-20 DIAGNOSIS — M6281 Muscle weakness (generalized): Secondary | ICD-10-CM | POA: Diagnosis not present

## 2019-05-20 DIAGNOSIS — R2689 Other abnormalities of gait and mobility: Secondary | ICD-10-CM | POA: Diagnosis not present

## 2019-05-25 DIAGNOSIS — F419 Anxiety disorder, unspecified: Secondary | ICD-10-CM | POA: Diagnosis not present

## 2019-05-25 DIAGNOSIS — R269 Unspecified abnormalities of gait and mobility: Secondary | ICD-10-CM | POA: Diagnosis not present

## 2019-05-25 DIAGNOSIS — R197 Diarrhea, unspecified: Secondary | ICD-10-CM | POA: Diagnosis not present

## 2019-05-25 DIAGNOSIS — F0391 Unspecified dementia with behavioral disturbance: Secondary | ICD-10-CM | POA: Diagnosis not present

## 2019-05-25 DIAGNOSIS — I10 Essential (primary) hypertension: Secondary | ICD-10-CM | POA: Diagnosis not present

## 2019-05-25 DIAGNOSIS — G894 Chronic pain syndrome: Secondary | ICD-10-CM | POA: Diagnosis not present

## 2019-05-27 DIAGNOSIS — R197 Diarrhea, unspecified: Secondary | ICD-10-CM | POA: Diagnosis not present

## 2019-05-27 DIAGNOSIS — Z79899 Other long term (current) drug therapy: Secondary | ICD-10-CM | POA: Diagnosis not present

## 2019-06-01 DIAGNOSIS — Z79899 Other long term (current) drug therapy: Secondary | ICD-10-CM | POA: Diagnosis not present

## 2019-06-01 DIAGNOSIS — R269 Unspecified abnormalities of gait and mobility: Secondary | ICD-10-CM | POA: Diagnosis not present

## 2019-06-01 DIAGNOSIS — I10 Essential (primary) hypertension: Secondary | ICD-10-CM | POA: Diagnosis not present

## 2019-06-01 DIAGNOSIS — R197 Diarrhea, unspecified: Secondary | ICD-10-CM | POA: Diagnosis not present

## 2019-06-01 DIAGNOSIS — G894 Chronic pain syndrome: Secondary | ICD-10-CM | POA: Diagnosis not present

## 2019-06-01 DIAGNOSIS — F0391 Unspecified dementia with behavioral disturbance: Secondary | ICD-10-CM | POA: Diagnosis not present

## 2019-06-01 DIAGNOSIS — R1110 Vomiting, unspecified: Secondary | ICD-10-CM | POA: Diagnosis not present

## 2019-06-01 DIAGNOSIS — R109 Unspecified abdominal pain: Secondary | ICD-10-CM | POA: Diagnosis not present

## 2019-06-04 DIAGNOSIS — R1110 Vomiting, unspecified: Secondary | ICD-10-CM | POA: Diagnosis not present

## 2019-06-04 DIAGNOSIS — R197 Diarrhea, unspecified: Secondary | ICD-10-CM | POA: Diagnosis not present

## 2019-06-04 DIAGNOSIS — Z79899 Other long term (current) drug therapy: Secondary | ICD-10-CM | POA: Diagnosis not present

## 2019-06-08 DIAGNOSIS — R1110 Vomiting, unspecified: Secondary | ICD-10-CM | POA: Diagnosis not present

## 2019-06-08 DIAGNOSIS — R197 Diarrhea, unspecified: Secondary | ICD-10-CM | POA: Diagnosis not present

## 2019-06-08 DIAGNOSIS — I10 Essential (primary) hypertension: Secondary | ICD-10-CM | POA: Diagnosis not present

## 2019-06-08 DIAGNOSIS — F0391 Unspecified dementia with behavioral disturbance: Secondary | ICD-10-CM | POA: Diagnosis not present

## 2019-06-08 DIAGNOSIS — F419 Anxiety disorder, unspecified: Secondary | ICD-10-CM | POA: Diagnosis not present

## 2019-06-08 DIAGNOSIS — R269 Unspecified abnormalities of gait and mobility: Secondary | ICD-10-CM | POA: Diagnosis not present

## 2019-06-10 DIAGNOSIS — R269 Unspecified abnormalities of gait and mobility: Secondary | ICD-10-CM | POA: Diagnosis not present

## 2019-06-10 DIAGNOSIS — G894 Chronic pain syndrome: Secondary | ICD-10-CM | POA: Diagnosis not present

## 2019-06-10 DIAGNOSIS — Z79899 Other long term (current) drug therapy: Secondary | ICD-10-CM | POA: Diagnosis not present

## 2019-06-10 DIAGNOSIS — I10 Essential (primary) hypertension: Secondary | ICD-10-CM | POA: Diagnosis not present

## 2019-06-15 DIAGNOSIS — F0391 Unspecified dementia with behavioral disturbance: Secondary | ICD-10-CM | POA: Diagnosis not present

## 2019-06-15 DIAGNOSIS — R634 Abnormal weight loss: Secondary | ICD-10-CM | POA: Diagnosis not present

## 2019-06-15 DIAGNOSIS — F419 Anxiety disorder, unspecified: Secondary | ICD-10-CM | POA: Diagnosis not present

## 2019-06-15 DIAGNOSIS — I10 Essential (primary) hypertension: Secondary | ICD-10-CM | POA: Diagnosis not present

## 2019-06-15 DIAGNOSIS — R197 Diarrhea, unspecified: Secondary | ICD-10-CM | POA: Diagnosis not present

## 2019-06-15 DIAGNOSIS — G894 Chronic pain syndrome: Secondary | ICD-10-CM | POA: Diagnosis not present

## 2019-06-15 DIAGNOSIS — R269 Unspecified abnormalities of gait and mobility: Secondary | ICD-10-CM | POA: Diagnosis not present

## 2019-06-20 ENCOUNTER — Emergency Department
Admission: EM | Admit: 2019-06-20 | Discharge: 2019-06-20 | Disposition: A | Discharge status: Skilled Nursing Facility | Payer: PPO | Source: Home / Self Care | Attending: Emergency Medicine | Admitting: Emergency Medicine

## 2019-06-20 ENCOUNTER — Emergency Department: Payer: PPO

## 2019-06-20 ENCOUNTER — Other Ambulatory Visit: Payer: Self-pay

## 2019-06-20 DIAGNOSIS — Z23 Encounter for immunization: Secondary | ICD-10-CM | POA: Insufficient documentation

## 2019-06-20 DIAGNOSIS — Y9389 Activity, other specified: Secondary | ICD-10-CM | POA: Insufficient documentation

## 2019-06-20 DIAGNOSIS — Z743 Need for continuous supervision: Secondary | ICD-10-CM | POA: Diagnosis not present

## 2019-06-20 DIAGNOSIS — F0281 Dementia in other diseases classified elsewhere with behavioral disturbance: Secondary | ICD-10-CM | POA: Insufficient documentation

## 2019-06-20 DIAGNOSIS — Y999 Unspecified external cause status: Secondary | ICD-10-CM | POA: Insufficient documentation

## 2019-06-20 DIAGNOSIS — S51012A Laceration without foreign body of left elbow, initial encounter: Secondary | ICD-10-CM | POA: Diagnosis not present

## 2019-06-20 DIAGNOSIS — S61412A Laceration without foreign body of left hand, initial encounter: Secondary | ICD-10-CM | POA: Insufficient documentation

## 2019-06-20 DIAGNOSIS — W050XXA Fall from non-moving wheelchair, initial encounter: Secondary | ICD-10-CM | POA: Insufficient documentation

## 2019-06-20 DIAGNOSIS — M79632 Pain in left forearm: Secondary | ICD-10-CM | POA: Diagnosis not present

## 2019-06-20 DIAGNOSIS — E039 Hypothyroidism, unspecified: Secondary | ICD-10-CM | POA: Insufficient documentation

## 2019-06-20 DIAGNOSIS — I10 Essential (primary) hypertension: Secondary | ICD-10-CM | POA: Insufficient documentation

## 2019-06-20 DIAGNOSIS — Z79899 Other long term (current) drug therapy: Secondary | ICD-10-CM | POA: Insufficient documentation

## 2019-06-20 DIAGNOSIS — Z7982 Long term (current) use of aspirin: Secondary | ICD-10-CM | POA: Insufficient documentation

## 2019-06-20 DIAGNOSIS — G301 Alzheimer's disease with late onset: Secondary | ICD-10-CM | POA: Insufficient documentation

## 2019-06-20 DIAGNOSIS — Y9289 Other specified places as the place of occurrence of the external cause: Secondary | ICD-10-CM | POA: Insufficient documentation

## 2019-06-20 DIAGNOSIS — R279 Unspecified lack of coordination: Secondary | ICD-10-CM | POA: Diagnosis not present

## 2019-06-20 DIAGNOSIS — M79642 Pain in left hand: Secondary | ICD-10-CM | POA: Diagnosis not present

## 2019-06-20 DIAGNOSIS — R404 Transient alteration of awareness: Secondary | ICD-10-CM | POA: Diagnosis not present

## 2019-06-20 DIAGNOSIS — R0902 Hypoxemia: Secondary | ICD-10-CM | POA: Diagnosis not present

## 2019-06-20 DIAGNOSIS — W19XXXA Unspecified fall, initial encounter: Secondary | ICD-10-CM

## 2019-06-20 DIAGNOSIS — S6992XA Unspecified injury of left wrist, hand and finger(s), initial encounter: Secondary | ICD-10-CM | POA: Diagnosis not present

## 2019-06-20 DIAGNOSIS — R609 Edema, unspecified: Secondary | ICD-10-CM | POA: Diagnosis not present

## 2019-06-20 MED ORDER — ACETAMINOPHEN 325 MG PO TABS
650.0000 mg | ORAL_TABLET | Freq: Once | ORAL | Status: AC
  Administered 2019-06-20: 16:00:00 650 mg via ORAL
  Filled 2019-06-20: qty 2

## 2019-06-20 MED ORDER — TETANUS-DIPHTH-ACELL PERTUSSIS 5-2.5-18.5 LF-MCG/0.5 IM SUSP
0.5000 mL | Freq: Once | INTRAMUSCULAR | Status: AC
  Administered 2019-06-20: 0.5 mL via INTRAMUSCULAR
  Filled 2019-06-20: qty 0.5

## 2019-06-20 NOTE — ED Notes (Signed)
This RN attempted to call daughter to inform of impending D/C with no answer. Will attempt to call back later.

## 2019-06-20 NOTE — ED Provider Notes (Signed)
[facility] Emergency Department Provider Note  ____________________________________________   First MD Initiated Contact with Patient 06/20/19 1507     (approximate)  I have reviewed the triage vital signs and the nursing notes.   HISTORY  Chief Complaint Fall    HPI Kerri Rangel is a 85 y.o. female  Here with fall. History somewhat limited 2/2 dementia.  Per report, patient had a witnessed fall from her wheelchair.  She slid forward, suffering a skin tear on her left hand.  There was no head injury.  She has a history of recurrent falls but on my assessment, she denies any pain.  Remainder of history limited secondary to dementia.  Level 5 caveat invoked as remainder of history, ROS, and physical exam limited due to patient's dementia.         Past Medical History:  Diagnosis Date  . Anemia   . Anxiety 08/07/2016  . Cancer ([place])    colon ca  . Common bile duct (CBD) obstruction 08/01/2016   Overview:  Possible stone noted 07/26/16 - refer to GI. LFTs nml but does have chronic abd pain   . Dementia ([place])   . Depression 08/07/2016  . Dizzy 03/02/2014  . Edema 03/02/2014  . Gallstone 08/01/2016  . GERD (gastroesophageal reflux disease)   . Heart disease   . Hyperlipidemia, unspecified 08/07/2016  . Hypertension 08/07/2016  . Hypothyroidism 08/07/2016  . Late onset Alzheimer's disease with behavioral disturbance ([place]) 03/02/2015  . Osteoporosis   . Vitamin D deficiency     Patient Active Problem List   Diagnosis Date Noted  . Pressure ulcer, heel 11/08/2017  . Closed left hip fracture ([place]) 09/25/2017  . Carotid artery disease ([place]) 11/06/2016  . Anxiety 08/07/2016  . Depression 08/07/2016  . Hyperlipidemia, unspecified 08/07/2016  . Hypertension 08/07/2016  . Hypothyroidism 08/07/2016  . Vitamin D deficiency   . Dementia ([place])   . Common bile duct (CBD) obstruction 08/01/2016  . Gallstone 08/01/2016  . Late onset Alzheimer's disease  with behavioral disturbance ([place]) 03/02/2015  . Dizzy 03/02/2014  . Edema 03/02/2014    Past Surgical History:  Procedure Laterality Date  . ABDOMINAL HYSTERECTOMY    . APPENDECTOMY    . Back sugery  03/2002   lumbar disk and fusion. Dr. Mauri Pole   . BREAST EXCISIONAL BIOPSY     Benign. Dr. Pat Patrick  . BREAST LUMPECTOMY N/A 1971   unsure  . CATARACT EXTRACTION Bilateral   . colon cancer resection  1990s   By Dr. Pat Patrick  . ERCP N/A 08/28/2016   Procedure: ENDOSCOPIC RETROGRADE CHOLANGIOPANCREATOGRAPHY (ERCP);  Surgeon: Lucilla Lame, MD;  Location: [facility] ENDOSCOPY;  Service: Endoscopy;  Laterality: N/A;  . HERNIA REPAIR  1995   ventral  . INTRAMEDULLARY (IM) NAIL INTERTROCHANTERIC Left 09/26/2017   Procedure: INTRAMEDULLARY (IM) NAIL INTERTROCHANTRIC;  Surgeon: Lovell Sheehan, MD;  Location: [facility] ORS;  Service: Orthopedics;  Laterality: Left;  . JOINT REPLACEMENT    . OOPHORECTOMY    . SPINE SURGERY      Prior to Admission medications   Medication Sig Start Date End Date Taking? Authorizing Provider  aspirin EC 81 MG EC tablet Take 1 tablet (81 mg total) by mouth daily. 09/30/17  Yes [name], MD  acetaminophen (TYLENOL) 325 MG tablet Take 650 mg by mouth every 4 (four) hours as needed for mild pain or moderate pain.    [provider]  acetaminophen (TYLENOL) 500 MG tablet Take 500 mg by mouth  every 6 (six) hours as needed for mild pain, fever or headache.    [provider]  aluminum-magnesium hydroxide-simethicone (MAALOX) [id number] MG/5ML SUSP Take 30 mLs by mouth every 6 (six) hours as needed (for heartburn and indigestion).    [provider]  atenolol (TENORMIN) 50 MG tablet Take 50 mg by mouth daily.     [provider]  busPIRone (BUSPAR) 15 MG tablet Take 15 mg by mouth 2 (two) times daily. 05/29/19   [provider]  docusate sodium (COLACE) 100 MG capsule Take 1 capsule (100 mg total) by mouth 2 (two) times daily. 09/29/17   [name], MD  donepezil (ARICEPT) 5 MG tablet Take 5 mg by mouth every morning.     [provider]  fentaNYL (DURAGESIC - DOSED MCG/HR) 12 MCG/HR Place 1 patch (12.5 mcg total) onto the skin every 3 (three) days. 09/29/17   [name], MD  levothyroxine (SYNTHROID, LEVOTHROID) 50 MCG tablet Take 50 mcg by mouth daily before breakfast.    [provider]  loperamide (IMODIUM) 2 MG capsule Take 2 mg by mouth as needed for diarrhea or loose stools.    [provider]  LORazepam (ATIVAN) 0.5 MG tablet Take 0.5 mg by mouth daily as needed for anxiety.    [provider]  magnesium hydroxide (MILK OF MAGNESIA) 400 MG/5ML suspension Take 30 mLs by mouth at bedtime as needed for mild constipation or moderate constipation.    [provider]  mirtazapine (REMERON) 30 MG tablet Take 30 mg by mouth at bedtime.     [provider]  neomycin-bacitracin-polymyxin (NEOSPORIN) [phone] ointment Apply 1 application topically as needed (for wound care).    [provider]  pantoprazole (PROTONIX) 40 MG tablet Take 40 mg by mouth daily.    [provider]  sertraline (ZOLOFT) 25 MG tablet Take 25 mg by mouth daily.    [provider]  vitamin B-12 (CYANOCOBALAMIN) 1000 MCG tablet Take 1,000 mcg by mouth daily.    [provider]    Allergies Patient has no known allergies.  Family History  Problem Relation Age of Onset  . Diabetes Mother   . Heart attack Mother   . Cancer Sister   . Diabetes Sister   . Alcohol abuse Brother   . Diabetes Brother   . Heart attack Brother   . Stroke Brother     Social History Social History   Tobacco Use  . Smoking status: Never Smoker  . Smokeless tobacco: Never Used  Substance Use Topics  . Alcohol use: No  . Drug use: No    Review of Systems  Review of Systems  Unable to perform ROS: Dementia  Endocrine: Positive for cold intolerance.      ____________________________________________  PHYSICAL EXAM:      VITAL SIGNS: ED Triage Vitals  Enc Vitals Group     BP 06/20/19 1449 (!) 154/43     Pulse Rate 06/20/19 1452 (!) 56     Resp 06/20/19 1449 14     Temp 06/20/19 1449 98.2 F (36.8 C)     Temp src --      SpO2 06/20/19 1452 94 %     Weight --      Height --      Head Circumference --      Peak Flow --      Pain Score --      Pain Loc --      Pain  Edu? --      Excl. in [place]? --      Physical Exam Vitals signs and nursing note reviewed.  Constitutional:      General: She is not in acute distress.    Appearance: She is well-developed.  HENT:     Head: Normocephalic. No contusion.     Comments: No apparent head trauma.  No bruising.  No hematoma.  No periorbital or postauricular ecchymoses. Eyes:     Conjunctiva/sclera: Conjunctivae normal.  Neck:     Musculoskeletal: Neck supple.  Cardiovascular:     Rate and Rhythm: Normal rate and regular rhythm.     Heart sounds: Normal heart sounds. No murmur. No friction rub.  Pulmonary:     Effort: Pulmonary effort is normal. No respiratory distress.     Breath sounds: Normal breath sounds. No wheezing or rales.  Abdominal:     General: There is no distension.     Palpations: Abdomen is soft.     Tenderness: There is no abdominal tenderness.  Musculoskeletal:     Comments: No apparent tenderness to palpation bilateral upper or lower extremities.  Bruising noted to the dorsum of the left hand with superficial, approximately 1 x 2 cm skin tear with no deep lacerations.  Distal sensation fully intact.  No obvious deformity.  No active bleeding.  Skin:    General: Skin is warm.     Capillary Refill: Capillary refill takes less than 2 seconds.  Neurological:     Mental Status: She is alert.     Motor: No abnormal muscle tone.     Comments: Oriented to person only, which is patient's baseline.  Moves all extremities.  Normal sensation to light touch.        ____________________________________________   LABS (all labs ordered are listed, but only abnormal results are displayed)  Labs Reviewed - No data to display  ____________________________________________  EKG: None ________________________________________  RADIOLOGY All imaging, including plain films, CT scans, and ultrasounds, independently reviewed by me, and interpretations confirmed via formal radiology reads.  ED MD interpretation:   Plain film: No acute fracture, chronic arthritis  Official radiology report(s): Dg Hand Complete Left  Result Date: 06/20/2019 CLINICAL DATA:  Left hand injury EXAM: LEFT HAND - COMPLETE 3+ VIEW COMPARISON:  None. FINDINGS: Suboptimal radiographs due to flexed positioning of the fingers, best obtainable due to patient related factors (patient with dementia). No evidence of fracture or dislocation. Chondrocalcinosis in the TFCC. Moderate first carpometacarpal joint osteoarthritis. Degenerative changes in the distal radioulnar joint and ulnocarpal joint. No radiopaque foreign body. No suspicious focal osseous lesions. IMPRESSION: 1. No evidence of fracture or dislocation in the left hand on these limited views, see comments. 2. Chondrocalcinosis in the TFCC, suggesting CPPD arthropathy. 3. 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 Electronically Signed   By: Ilona Sorrel M.D.   On: 06/20/2019 15:57    ____________________________________________  PROCEDURES   Procedure(s) performed (including Critical Care):  Procedures  ____________________________________________  INITIAL IMPRESSION / MDM / [place] / ED COURSE  As part of my medical decision making, I reviewed the following data within the [place] was evaluated in Emergency Department on 06/20/2019 for the symptoms described in the history of present illness. She was evaluated in the context of the global  COVID-19 pandemic, which necessitated consideration that the patient might be at risk for infection with the SARS-CoV-2 virus  that causes COVID-19. Institutional protocols and algorithms that pertain to the evaluation of patients at risk for COVID-19 are in a state of rapid change based on information released by regulatory bodies including the CDC and federal and state organizations. These policies and algorithms were followed during the patient's care in the ED.  Some ED evaluations and interventions may be delayed as a result of limited staffing during the pandemic.*      Medical Decision Making: 85 year old female here with superficial skin tear to the left hand.  No bruising.  No deformity.  No other area of trauma and the fall was witnessed.  Plain films negative.  The wound was cleaned, dressed, and tetanus updated.  Discharged with local wound care for skin tear.  ____________________________________________  FINAL CLINICAL IMPRESSION(S) / ED DIAGNOSES  Final diagnoses:  Skin tear of left elbow without complication, initial encounter  Fall, initial encounter     MEDICATIONS GIVEN DURING THIS VISIT:  Medications  acetaminophen (TYLENOL) tablet 650 mg (650 mg Oral Given 06/20/19 1536)  Tdap (BOOSTRIX) injection 0.5 mL (0.5 mLs Intramuscular Given 06/20/19 1537)     ED Discharge Orders    None       Note:  This document was prepared using Dragon voice recognition software and may include unintentional dictation errors.   Duffy Bruce, MD 06/20/19 [phone]

## 2019-06-20 NOTE — ED Notes (Signed)
Patient transported to X-ray 

## 2019-06-20 NOTE — ED Triage Notes (Signed)
Pt presents via EMS s/o fall out of wheelchair. Reports skin tear to left hand per EMS. Hx dementia. No acute neuro changes per EMS

## 2019-06-20 NOTE — ED Notes (Signed)
This RN attempted to call report to [facility] with no answer. This RN will attempt to call report again.

## 2019-06-20 NOTE — ED Notes (Signed)
Skin tear noted to left hand. Area cleaned and dressing applied per MD Isaacs orders.

## 2019-06-20 NOTE — ED Notes (Signed)
Pt unable to sign for self at D/C. D/C instructions discussed with Theadora Rama, RN at care facility. No further questions at this time. Pt in NAD at time of D/C.

## 2019-06-20 NOTE — Discharge Instructions (Signed)
Apply a nonstick dressing to the skin tear, once daily, until healed.  Keep the area clean.

## 2019-06-22 ENCOUNTER — Emergency Department: Payer: PPO

## 2019-06-22 ENCOUNTER — Inpatient Hospital Stay
Admission: EM | Admit: 2019-06-22 | Discharge: 2019-07-10 | DRG: 065 | Disposition: A | Discharge status: Hospice | Payer: PPO | Source: Skilled Nursing Facility | Attending: Internal Medicine | Admitting: Internal Medicine

## 2019-06-22 ENCOUNTER — Encounter: Payer: Self-pay | Admitting: Emergency Medicine

## 2019-06-22 ENCOUNTER — Other Ambulatory Visit: Payer: Self-pay

## 2019-06-22 ENCOUNTER — Observation Stay: Payer: PPO

## 2019-06-22 DIAGNOSIS — S299XXA Unspecified injury of thorax, initial encounter: Secondary | ICD-10-CM | POA: Diagnosis not present

## 2019-06-22 DIAGNOSIS — Z7401 Bed confinement status: Secondary | ICD-10-CM | POA: Diagnosis not present

## 2019-06-22 DIAGNOSIS — K2100 Gastro-esophageal reflux disease with esophagitis, without bleeding: Secondary | ICD-10-CM | POA: Diagnosis not present

## 2019-06-22 DIAGNOSIS — I441 Atrioventricular block, second degree: Secondary | ICD-10-CM | POA: Diagnosis present

## 2019-06-22 DIAGNOSIS — F02818 Dementia in other diseases classified elsewhere, unspecified severity, with other behavioral disturbance: Secondary | ICD-10-CM | POA: Diagnosis present

## 2019-06-22 DIAGNOSIS — Z515 Encounter for palliative care: Secondary | ICD-10-CM | POA: Diagnosis not present

## 2019-06-22 DIAGNOSIS — I10 Essential (primary) hypertension: Secondary | ICD-10-CM

## 2019-06-22 DIAGNOSIS — Z66 Do not resuscitate: Secondary | ICD-10-CM | POA: Diagnosis not present

## 2019-06-22 DIAGNOSIS — R29898 Other symptoms and signs involving the musculoskeletal system: Secondary | ICD-10-CM | POA: Diagnosis not present

## 2019-06-22 DIAGNOSIS — S3993XA Unspecified injury of pelvis, initial encounter: Secondary | ICD-10-CM | POA: Diagnosis not present

## 2019-06-22 DIAGNOSIS — I63411 Cerebral infarction due to embolism of right middle cerebral artery: Principal | ICD-10-CM | POA: Diagnosis present

## 2019-06-22 DIAGNOSIS — Z90710 Acquired absence of both cervix and uterus: Secondary | ICD-10-CM

## 2019-06-22 DIAGNOSIS — F0281 Dementia in other diseases classified elsewhere with behavioral disturbance: Secondary | ICD-10-CM | POA: Diagnosis not present

## 2019-06-22 DIAGNOSIS — S59912A Unspecified injury of left forearm, initial encounter: Secondary | ICD-10-CM | POA: Diagnosis not present

## 2019-06-22 DIAGNOSIS — Y92099 Unspecified place in other non-institutional residence as the place of occurrence of the external cause: Secondary | ICD-10-CM | POA: Diagnosis not present

## 2019-06-22 DIAGNOSIS — G894 Chronic pain syndrome: Secondary | ICD-10-CM | POA: Diagnosis not present

## 2019-06-22 DIAGNOSIS — Z9660 Presence of unspecified orthopedic joint implant: Secondary | ICD-10-CM | POA: Diagnosis present

## 2019-06-22 DIAGNOSIS — Z85038 Personal history of other malignant neoplasm of large intestine: Secondary | ICD-10-CM

## 2019-06-22 DIAGNOSIS — R1310 Dysphagia, unspecified: Secondary | ICD-10-CM | POA: Diagnosis present

## 2019-06-22 DIAGNOSIS — F419 Anxiety disorder, unspecified: Secondary | ICD-10-CM | POA: Diagnosis not present

## 2019-06-22 DIAGNOSIS — G8929 Other chronic pain: Secondary | ICD-10-CM | POA: Diagnosis not present

## 2019-06-22 DIAGNOSIS — E039 Hypothyroidism, unspecified: Secondary | ICD-10-CM | POA: Diagnosis not present

## 2019-06-22 DIAGNOSIS — R001 Bradycardia, unspecified: Secondary | ICD-10-CM | POA: Diagnosis not present

## 2019-06-22 DIAGNOSIS — E785 Hyperlipidemia, unspecified: Secondary | ICD-10-CM | POA: Diagnosis present

## 2019-06-22 DIAGNOSIS — Z79891 Long term (current) use of opiate analgesic: Secondary | ICD-10-CM

## 2019-06-22 DIAGNOSIS — Z9049 Acquired absence of other specified parts of digestive tract: Secondary | ICD-10-CM

## 2019-06-22 DIAGNOSIS — G301 Alzheimer's disease with late onset: Secondary | ICD-10-CM | POA: Diagnosis not present

## 2019-06-22 DIAGNOSIS — F329 Major depressive disorder, single episode, unspecified: Secondary | ICD-10-CM | POA: Diagnosis not present

## 2019-06-22 DIAGNOSIS — Z823 Family history of stroke: Secondary | ICD-10-CM

## 2019-06-22 DIAGNOSIS — Z7982 Long term (current) use of aspirin: Secondary | ICD-10-CM

## 2019-06-22 DIAGNOSIS — W050XXA Fall from non-moving wheelchair, initial encounter: Secondary | ICD-10-CM | POA: Diagnosis present

## 2019-06-22 DIAGNOSIS — I959 Hypotension, unspecified: Secondary | ICD-10-CM | POA: Diagnosis not present

## 2019-06-22 DIAGNOSIS — Z79899 Other long term (current) drug therapy: Secondary | ICD-10-CM | POA: Diagnosis not present

## 2019-06-22 DIAGNOSIS — Z981 Arthrodesis status: Secondary | ICD-10-CM

## 2019-06-22 DIAGNOSIS — S61412A Laceration without foreign body of left hand, initial encounter: Secondary | ICD-10-CM | POA: Diagnosis present

## 2019-06-22 DIAGNOSIS — Z20828 Contact with and (suspected) exposure to other viral communicable diseases: Secondary | ICD-10-CM | POA: Diagnosis not present

## 2019-06-22 DIAGNOSIS — F0391 Unspecified dementia with behavioral disturbance: Secondary | ICD-10-CM | POA: Diagnosis not present

## 2019-06-22 DIAGNOSIS — Z23 Encounter for immunization: Secondary | ICD-10-CM

## 2019-06-22 DIAGNOSIS — S6992XA Unspecified injury of left wrist, hand and finger(s), initial encounter: Secondary | ICD-10-CM | POA: Diagnosis not present

## 2019-06-22 DIAGNOSIS — W19XXXA Unspecified fall, initial encounter: Secondary | ICD-10-CM | POA: Diagnosis not present

## 2019-06-22 DIAGNOSIS — R404 Transient alteration of awareness: Secondary | ICD-10-CM | POA: Diagnosis not present

## 2019-06-22 DIAGNOSIS — Z9841 Cataract extraction status, right eye: Secondary | ICD-10-CM

## 2019-06-22 DIAGNOSIS — R609 Edema, unspecified: Secondary | ICD-10-CM

## 2019-06-22 DIAGNOSIS — Z993 Dependence on wheelchair: Secondary | ICD-10-CM | POA: Diagnosis not present

## 2019-06-22 DIAGNOSIS — R269 Unspecified abnormalities of gait and mobility: Secondary | ICD-10-CM | POA: Diagnosis not present

## 2019-06-22 DIAGNOSIS — R600 Localized edema: Secondary | ICD-10-CM | POA: Diagnosis not present

## 2019-06-22 DIAGNOSIS — Z79890 Hormone replacement therapy (postmenopausal): Secondary | ICD-10-CM

## 2019-06-22 DIAGNOSIS — M810 Age-related osteoporosis without current pathological fracture: Secondary | ICD-10-CM | POA: Diagnosis present

## 2019-06-22 DIAGNOSIS — Z743 Need for continuous supervision: Secondary | ICD-10-CM | POA: Diagnosis not present

## 2019-06-22 DIAGNOSIS — S61412D Laceration without foreign body of left hand, subsequent encounter: Secondary | ICD-10-CM | POA: Diagnosis not present

## 2019-06-22 DIAGNOSIS — I639 Cerebral infarction, unspecified: Secondary | ICD-10-CM | POA: Diagnosis present

## 2019-06-22 DIAGNOSIS — S0990XA Unspecified injury of head, initial encounter: Secondary | ICD-10-CM | POA: Diagnosis not present

## 2019-06-22 DIAGNOSIS — G8324 Monoplegia of upper limb affecting left nondominant side: Secondary | ICD-10-CM | POA: Diagnosis present

## 2019-06-22 DIAGNOSIS — Z9842 Cataract extraction status, left eye: Secondary | ICD-10-CM

## 2019-06-22 DIAGNOSIS — S4992XA Unspecified injury of left shoulder and upper arm, initial encounter: Secondary | ICD-10-CM | POA: Diagnosis not present

## 2019-06-22 DIAGNOSIS — S199XXA Unspecified injury of neck, initial encounter: Secondary | ICD-10-CM | POA: Diagnosis not present

## 2019-06-22 DIAGNOSIS — R29707 NIHSS score 7: Secondary | ICD-10-CM | POA: Diagnosis present

## 2019-06-22 DIAGNOSIS — R8271 Bacteriuria: Secondary | ICD-10-CM | POA: Diagnosis not present

## 2019-06-22 DIAGNOSIS — M25532 Pain in left wrist: Secondary | ICD-10-CM | POA: Diagnosis present

## 2019-06-22 DIAGNOSIS — F0390 Unspecified dementia without behavioral disturbance: Secondary | ICD-10-CM | POA: Diagnosis present

## 2019-06-22 DIAGNOSIS — Z7189 Other specified counseling: Secondary | ICD-10-CM | POA: Diagnosis not present

## 2019-06-22 DIAGNOSIS — Z8249 Family history of ischemic heart disease and other diseases of the circulatory system: Secondary | ICD-10-CM

## 2019-06-22 DIAGNOSIS — I340 Nonrheumatic mitral (valve) insufficiency: Secondary | ICD-10-CM | POA: Diagnosis not present

## 2019-06-22 DIAGNOSIS — R531 Weakness: Secondary | ICD-10-CM | POA: Diagnosis not present

## 2019-06-22 DIAGNOSIS — Z833 Family history of diabetes mellitus: Secondary | ICD-10-CM

## 2019-06-22 DIAGNOSIS — I63233 Cerebral infarction due to unspecified occlusion or stenosis of bilateral carotid arteries: Secondary | ICD-10-CM | POA: Diagnosis not present

## 2019-06-22 DIAGNOSIS — R197 Diarrhea, unspecified: Secondary | ICD-10-CM | POA: Diagnosis not present

## 2019-06-22 DIAGNOSIS — M6281 Muscle weakness (generalized): Secondary | ICD-10-CM | POA: Diagnosis not present

## 2019-06-22 DIAGNOSIS — R279 Unspecified lack of coordination: Secondary | ICD-10-CM | POA: Diagnosis not present

## 2019-06-22 LAB — CBC WITH DIFFERENTIAL/PLATELET
Abs Immature Granulocytes: 0.04 10*3/uL (ref 0.00–0.07)
Basophils Absolute: 0.1 10*3/uL (ref 0.0–0.1)
Basophils Relative: 1 %
Eosinophils Absolute: 0.1 10*3/uL (ref 0.0–0.5)
Eosinophils Relative: 1 %
HCT: 39 % (ref 36.0–46.0)
Hemoglobin: 12.1 g/dL (ref 12.0–15.0)
Immature Granulocytes: 0 %
Lymphocytes Relative: 13 %
Lymphs Abs: 1.2 10*3/uL (ref 0.7–4.0)
MCH: 30.3 pg (ref 26.0–34.0)
MCHC: 31 g/dL (ref 30.0–36.0)
MCV: 97.5 fL (ref 80.0–100.0)
Monocytes Absolute: 0.8 10*3/uL (ref 0.1–1.0)
Monocytes Relative: 9 %
Neutro Abs: 7.2 10*3/uL (ref 1.7–7.7)
Neutrophils Relative %: 76 %
Platelets: 212 10*3/uL (ref 150–400)
RBC: 4 MIL/uL (ref 3.87–5.11)
RDW: 13 % (ref 11.5–15.5)
WBC: 9.5 10*3/uL (ref 4.0–10.5)
nRBC: 0 % (ref 0.0–0.2)

## 2019-06-22 LAB — BASIC METABOLIC PANEL
Anion gap: 9 (ref 5–15)
BUN: 21 mg/dL (ref 8–23)
CO2: 22 mmol/L (ref 22–32)
Calcium: 10.5 mg/dL — ABNORMAL HIGH (ref 8.9–10.3)
Chloride: 111 mmol/L (ref 98–111)
Creatinine, Ser: 0.89 mg/dL (ref 0.44–1.00)
GFR calc Af Amer: >60 mL/min (ref >60)
GFR calc non Af Amer: 56 mL/min — ABNORMAL LOW (ref >60)
Glucose, Bld: 172 mg/dL — ABNORMAL HIGH (ref 70–99)
Potassium: 3.7 mmol/L (ref 3.5–5.1)
Sodium: 142 mmol/L (ref 135–145)

## 2019-06-22 MED ORDER — BUSPIRONE HCL 10 MG PO TABS
15.0000 mg | ORAL_TABLET | Freq: Two times a day (BID) | ORAL | Status: DC
  Administered 2019-06-23 – 2019-07-05 (×23): 15 mg via ORAL
  Filled 2019-06-22: qty 1
  Filled 2019-06-22: qty 2
  Filled 2019-06-22 (×2): qty 1
  Filled 2019-06-22: qty 2
  Filled 2019-06-22 (×21): qty 1

## 2019-06-22 MED ORDER — STROKE: EARLY STAGES OF RECOVERY BOOK
Freq: Once | Status: AC
  Administered 2019-06-22: 21:00:00

## 2019-06-22 MED ORDER — LEVOTHYROXINE SODIUM 50 MCG PO TABS
50.0000 ug | ORAL_TABLET | Freq: Every day | ORAL | Status: DC
  Administered 2019-06-24 – 2019-07-10 (×14): 50 ug via ORAL
  Filled 2019-06-22 (×15): qty 1

## 2019-06-22 MED ORDER — LOPERAMIDE HCL 2 MG PO CAPS
2.0000 mg | ORAL_CAPSULE | ORAL | Status: DC | PRN
  Filled 2019-06-22: qty 1

## 2019-06-22 MED ORDER — MAGNESIUM HYDROXIDE 400 MG/5ML PO SUSP
30.0000 mL | Freq: Every evening | ORAL | Status: DC | PRN
  Filled 2019-06-22: qty 30

## 2019-06-22 MED ORDER — ATENOLOL 50 MG PO TABS
50.0000 mg | ORAL_TABLET | Freq: Every day | ORAL | Status: DC
  Administered 2019-06-25 – 2019-06-27 (×3): 50 mg via ORAL
  Filled 2019-06-22 (×5): qty 1

## 2019-06-22 MED ORDER — MIRTAZAPINE 15 MG PO TABS
30.0000 mg | ORAL_TABLET | Freq: Every day | ORAL | Status: DC
  Administered 2019-06-23 – 2019-07-09 (×15): 30 mg via ORAL
  Filled 2019-06-22 (×15): qty 2

## 2019-06-22 MED ORDER — ONDANSETRON HCL 4 MG PO TABS: 4.0000 mg | ORAL_TABLET | Freq: Four times a day (QID) | ORAL | Status: DC | PRN

## 2019-06-22 MED ORDER — ASPIRIN 300 MG RE SUPP
300.0000 mg | Freq: Once | RECTAL | Status: AC
  Administered 2019-06-22: 300 mg via RECTAL
  Filled 2019-06-22: qty 1

## 2019-06-22 MED ORDER — ACETAMINOPHEN 325 MG PO TABS
650.0000 mg | ORAL_TABLET | Freq: Four times a day (QID) | ORAL | Status: DC | PRN
  Administered 2019-06-27 – 2019-06-29 (×2): 650 mg via ORAL
  Filled 2019-06-22 (×2): qty 2

## 2019-06-22 MED ORDER — SERTRALINE HCL 50 MG PO TABS
25.0000 mg | ORAL_TABLET | Freq: Every day | ORAL | Status: DC
  Administered 2019-06-23 – 2019-07-05 (×13): 25 mg via ORAL
  Filled 2019-06-22 (×14): qty 1

## 2019-06-22 MED ORDER — ASPIRIN EC 81 MG PO TBEC
81.0000 mg | DELAYED_RELEASE_TABLET | Freq: Every day | ORAL | Status: DC
  Administered 2019-06-23 – 2019-07-02 (×10): 81 mg via ORAL
  Filled 2019-06-22 (×20): qty 1

## 2019-06-22 MED ORDER — DONEPEZIL HCL 5 MG PO TABS
5.0000 mg | ORAL_TABLET | Freq: Every morning | ORAL | Status: DC
  Administered 2019-06-23 – 2019-06-28 (×6): 5 mg via ORAL
  Filled 2019-06-22 (×6): qty 1

## 2019-06-22 MED ORDER — LORAZEPAM 0.5 MG PO TABS
0.5000 mg | ORAL_TABLET | Freq: Every day | ORAL | Status: DC | PRN
  Administered 2019-07-01: 23:00:00 0.5 mg via ORAL
  Filled 2019-06-22: qty 1

## 2019-06-22 MED ORDER — ONDANSETRON HCL 4 MG/2ML IJ SOLN: 4.0000 mg | Freq: Four times a day (QID) | INTRAMUSCULAR | Status: DC | PRN

## 2019-06-22 MED ORDER — ASPIRIN 81 MG PO CHEW: 324.0000 mg | CHEWABLE_TABLET | Freq: Once | ORAL | Status: DC

## 2019-06-22 MED ORDER — ALUM & MAG HYDROXIDE-SIMETH 200-200-20 MG/5ML PO SUSP: 30.0000 mL | Freq: Four times a day (QID) | ORAL | Status: DC | PRN

## 2019-06-22 MED ORDER — PANTOPRAZOLE SODIUM 40 MG PO TBEC
40.0000 mg | DELAYED_RELEASE_TABLET | Freq: Every day | ORAL | Status: DC
  Administered 2019-06-23 – 2019-07-05 (×13): 40 mg via ORAL
  Filled 2019-06-22 (×14): qty 1

## 2019-06-22 MED ORDER — SACCHAROMYCES BOULARDII 250 MG PO CAPS
250.0000 mg | ORAL_CAPSULE | Freq: Two times a day (BID) | ORAL | Status: DC
  Administered 2019-06-23 – 2019-07-02 (×20): 250 mg via ORAL
  Filled 2019-06-22 (×24): qty 1

## 2019-06-22 MED ORDER — DOCUSATE SODIUM 100 MG PO CAPS
100.0000 mg | ORAL_CAPSULE | Freq: Two times a day (BID) | ORAL | Status: DC
  Administered 2019-06-23 – 2019-06-30 (×12): 100 mg via ORAL
  Filled 2019-06-22 (×13): qty 1

## 2019-06-22 MED ORDER — FENTANYL 12 MCG/HR TD PT72
1.0000 | MEDICATED_PATCH | TRANSDERMAL | Status: DC
  Administered 2019-06-25 – 2019-07-07 (×5): 1 via TRANSDERMAL
  Filled 2019-06-22 (×6): qty 1

## 2019-06-22 MED ORDER — VITAMIN B-12 1000 MCG PO TABS
1000.0000 ug | ORAL_TABLET | Freq: Every day | ORAL | Status: DC
  Administered 2019-06-23 – 2019-07-02 (×10): 1000 ug via ORAL
  Filled 2019-06-22 (×10): qty 1

## 2019-06-22 MED ORDER — FERROUS SULFATE 325 (65 FE) MG PO TABS
325.0000 mg | ORAL_TABLET | Freq: Every day | ORAL | Status: DC
  Administered 2019-06-23 – 2019-07-02 (×10): 325 mg via ORAL
  Filled 2019-06-22 (×10): qty 1

## 2019-06-22 MED ORDER — ENOXAPARIN SODIUM 40 MG/0.4ML ~~LOC~~ SOLN
40.0000 mg | SUBCUTANEOUS | Status: DC
  Administered 2019-06-23 – 2019-07-02 (×10): 40 mg via SUBCUTANEOUS
  Filled 2019-06-22 (×11): qty 0.4

## 2019-06-22 MED ORDER — ACETAMINOPHEN 650 MG RE SUPP: 650.0000 mg | Freq: Four times a day (QID) | RECTAL | Status: DC | PRN

## 2019-06-22 NOTE — ED Notes (Signed)
Dr. Jari Pigg informed pt failed swallow screen

## 2019-06-22 NOTE — ED Notes (Signed)
Attempted IV access x 2 without success.

## 2019-06-22 NOTE — ED Notes (Signed)
Blood draw attempt x 3 by this RN

## 2019-06-22 NOTE — ED Provider Notes (Signed)
[facility] Emergency Department Provider Note  ____________________________________________   First MD Initiated Contact with Patient 06/22/19 1310     (approximate)  I have reviewed the triage vital signs and the nursing notes.   HISTORY  Chief Complaint Arm Problem    HPI Kerri Rangel is a 85 y.o. female with dementia who presents for left arm injury.  Patient resides at the [facility].  Patient had a fall on 10/24 and had a skin tear to the left hand.  At that time x-rays of the wrist were negative.  Patient has not been moving her left arm and wrist. The left wrist appears swollen.  There also concern that she might of had another fall today.   History is limited due to patient's baseline dementia.      Past Medical History:  Diagnosis Date   Anemia    Anxiety 08/07/2016   Cancer ([place])    colon ca   Common bile duct (CBD) obstruction 08/01/2016   Overview:  Possible stone noted 07/26/16 - refer to GI. LFTs nml but does have chronic abd pain    Dementia ([place])    Depression 08/07/2016   Dizzy 03/02/2014   Edema 03/02/2014   Gallstone 08/01/2016   GERD (gastroesophageal reflux disease)    Heart disease    Hyperlipidemia, unspecified 08/07/2016   Hypertension 08/07/2016   Hypothyroidism 08/07/2016   Late onset Alzheimer's disease with behavioral disturbance ([place]) 03/02/2015   Osteoporosis    Vitamin D deficiency     Patient Active Problem List   Diagnosis Date Noted   Pressure ulcer, heel 11/08/2017   Closed left hip fracture ([place]) 09/25/2017   Carotid artery disease ([place]) 11/06/2016   Anxiety 08/07/2016   Depression 08/07/2016   Hyperlipidemia, unspecified 08/07/2016   Hypertension 08/07/2016   Hypothyroidism 08/07/2016   Vitamin D deficiency    Dementia ([place])    Common bile duct (CBD) obstruction 08/01/2016   Gallstone 08/01/2016   Late onset Alzheimer's disease with behavioral disturbance  ([place]) 03/02/2015   Dizzy 03/02/2014   Edema 03/02/2014    Past Surgical History:  Procedure Laterality Date   ABDOMINAL HYSTERECTOMY     APPENDECTOMY     Back sugery  03/2002   lumbar disk and fusion. Dr. Mauri Pole    BREAST EXCISIONAL BIOPSY     Benign. Dr. Pat Patrick   BREAST LUMPECTOMY N/A 1971   unsure   CATARACT EXTRACTION Bilateral    colon cancer resection  1990s   By Dr. Pat Patrick   ERCP N/A 08/28/2016   Procedure: ENDOSCOPIC RETROGRADE CHOLANGIOPANCREATOGRAPHY (ERCP);  Surgeon: Lucilla Lame, MD;  Location: [facility] ENDOSCOPY;  Service: Endoscopy;  Laterality: N/A;   HERNIA REPAIR  1995   ventral   INTRAMEDULLARY (IM) NAIL INTERTROCHANTERIC Left 09/26/2017   Procedure: INTRAMEDULLARY (IM) NAIL INTERTROCHANTRIC;  Surgeon: Lovell Sheehan, MD;  Location: [facility] ORS;  Service: Orthopedics;  Laterality: Left;   JOINT REPLACEMENT     OOPHORECTOMY     SPINE SURGERY      Prior to Admission medications   Medication Sig Start Date End Date Taking? Authorizing Provider  acetaminophen (TYLENOL) 325 MG tablet Take 650 mg by mouth every 4 (four) hours as needed for mild pain or moderate pain.    [provider]  acetaminophen (TYLENOL) 500 MG tablet Take 500 mg by mouth every 6 (six) hours as needed for mild pain, fever or headache.    [provider]  aluminum-magnesium hydroxide-simethicone ([place]) [id number] MG/5ML SUSP  Take 30 mLs by mouth every 6 (six) hours as needed (for heartburn and indigestion).    [provider]  aspirin EC 81 MG EC tablet Take 1 tablet (81 mg total) by mouth daily. 09/30/17   [name], MD  atenolol (TENORMIN) 50 MG tablet Take 50 mg by mouth daily.     [provider]  busPIRone (BUSPAR) 15 MG tablet Take 15 mg by mouth 2 (two) times daily. 05/29/19   [provider]  docusate sodium (COLACE) 100 MG capsule Take 1 capsule (100 mg total) by mouth 2 (two) times daily. 09/29/17   [name], MD  donepezil  (ARICEPT) 5 MG tablet Take 5 mg by mouth every morning.     [provider]  fentaNYL (DURAGESIC - DOSED MCG/HR) 12 MCG/HR Place 1 patch (12.5 mcg total) onto the skin every 3 (three) days. 09/29/17   [name], MD  levothyroxine (SYNTHROID, LEVOTHROID) 50 MCG tablet Take 50 mcg by mouth daily before breakfast.    [provider]  loperamide (IMODIUM) 2 MG capsule Take 2 mg by mouth as needed for diarrhea or loose stools.    [provider]  LORazepam (ATIVAN) 0.5 MG tablet Take 0.5 mg by mouth daily as needed for anxiety.    [provider]  magnesium hydroxide (MILK OF MAGNESIA) 400 MG/5ML suspension Take 30 mLs by mouth at bedtime as needed for mild constipation or moderate constipation.    [provider]  mirtazapine (REMERON) 30 MG tablet Take 30 mg by mouth at bedtime.     [provider]  neomycin-bacitracin-polymyxin (NEOSPORIN) [phone] ointment Apply 1 application topically as needed (for wound care).    [provider]  pantoprazole (PROTONIX) 40 MG tablet Take 40 mg by mouth daily.    [provider]  sertraline (ZOLOFT) 25 MG tablet Take 25 mg by mouth daily.    [provider]  vitamin B-12 (CYANOCOBALAMIN) 1000 MCG tablet Take 1,000 mcg by mouth daily.    [provider]    Allergies Patient has no known allergies.  Family History  Problem Relation Age of Onset   Diabetes Mother    Heart attack Mother    Cancer Sister    Diabetes Sister    Alcohol abuse Brother    Diabetes Brother    Heart attack Brother    Stroke Brother     Social History Social History   Tobacco Use   Smoking status: Never Smoker   Smokeless tobacco: Never Used  Substance Use Topics   Alcohol use: No   Drug use: No      Review of Systems Unable to get full review of systems due to patient's baseline dementia. ____________________________________________   PHYSICAL EXAM:  VITAL  SIGNS: ED Triage Vitals  Enc Vitals Group     BP 06/22/19 1309 (!) 129/48     Pulse Rate 06/22/19 1309 67     Resp 06/22/19 1309 16     Temp 06/22/19 1309 97.8 F (36.6 C)     Temp Source 06/22/19 1309 Oral     SpO2 06/22/19 1309 100 %     Weight 06/22/19 1310 150 lb (68 kg)     Height 06/22/19 1310 5\' 5"  (1.651 m)     Head Circumference --      Peak Flow --      Pain Score --      Pain Loc --      Pain Edu? --  Excl. in GC? --     Constitutional: Alert and oriented x1  Eyes: Conjunctivae are normal. EOMI. Head: Atraumatic. Nose: No congestion/rhinnorhea. Mouth/Throat: Mucous membranes are moist.   Neck: No stridor. Trachea Midline. FROM Cardiovascular: Normal rate, regular rhythm. Grossly normal heart sounds.  Good peripheral circulation. Respiratory: Normal respiratory effort.  No retractions. Lungs CTAB. Gastrointestinal: Soft and nontender. No distention. No abdominal bruits.  Musculoskeletal:   Skin tear to the dorsum of the left hand with some swelling noted.  Good distal pulse. Neurologic:  Normal speech and language. No gross focal neurologic deficits are appreciated. Able to move her left arm but maybe not as well as the right arm.  Skin:  Skin is warm, dry and intact. No rash noted. Psychiatric: Unable to fully assess due to baseline dementia. GU: Deferred   ____________________________________________   LABS (all labs ordered are listed, but only abnormal results are displayed)  Labs Reviewed  BASIC METABOLIC PANEL - Abnormal; Notable for the following components:      Result Value   Glucose, Bld 172 (*)    Calcium 10.5 (*)    GFR calc non Af Amer 56 (*)    All other components within normal limits  SARS CORONAVIRUS 2 (TAT 6-24 HRS)  CBC WITH DIFFERENTIAL/PLATELET   ____________________________________________   ED ECG REPORT I, Vanessa [place], the attending physician, personally viewed and interpreted this ECG.  EKG wandering baseline but looks  sinus rate of 68, no ST elevations, no T wave inversions, type I AV block ____________________________________________  RADIOLOGY Robert Bellow, personally viewed and evaluated these images (plain radiographs) as part of my medical decision making, as well as reviewing the written report by the radiologist.  ED MD interpretation: X-rays are reviewed and are negative.  Official radiology report(s): Dg Chest 1 View  Result Date: 06/22/2019 CLINICAL DATA:  Fall. EXAM: CHEST  1 VIEW COMPARISON:  September 29, 2017. FINDINGS: Stable cardiomediastinal silhouette. Atherosclerosis of thoracic aorta is noted. No pneumothorax or pleural effusion is noted. Bony thorax is unremarkable. No acute pulmonary disease is noted. IMPRESSION: No active disease. Aortic Atherosclerosis (ICD10-I70.0). Electronically Signed   By: Marijo Conception M.D.   On: 06/22/2019 15:04   Dg Forearm Left  Result Date: 06/22/2019 CLINICAL DATA:  Fall. EXAM: LEFT FOREARM - 2 VIEW COMPARISON:  None. FINDINGS: There is no evidence of fracture or other focal bone lesions. Soft tissues are unremarkable. IMPRESSION: Negative. Electronically Signed   By: Marijo Conception M.D.   On: 06/22/2019 15:08   Dg Wrist Complete Left  Result Date: 06/22/2019 CLINICAL DATA:  Fall. EXAM: LEFT WRIST - COMPLETE 3+ VIEW COMPARISON:  None. FINDINGS: No definite fracture or dislocation is noted. There appears to be extensive degenerative change seen involving the radiocarpal and ulnar carpal joints, with subchondral cyst formation. Degenerative changes are seen involving several intercarpal joints as well as the first carpometacarpal joint. Soft tissues are unremarkable. IMPRESSION: Extensive degenerative changes as described above. No definite acute abnormality seen in the left wrist. Electronically Signed   By: Marijo Conception M.D.   On: 06/22/2019 15:10   Ct Head Wo Contrast  Result Date: 06/22/2019 CLINICAL DATA:  Minor head trauma. EXAM: CT HEAD  WITHOUT CONTRAST CT CERVICAL SPINE WITHOUT CONTRAST TECHNIQUE: Multidetector CT imaging of the head and cervical spine was performed following the standard protocol without intravenous contrast. Multiplanar CT image reconstructions of the cervical spine were also generated. COMPARISON:  Head CT 06/09/2018 and cervical  spine CT 05/26/2008 FINDINGS: CT HEAD FINDINGS Brain: Mild prominence of the ventricles, cisterns and other CSF spaces compatible with atrophy. Moderate chronic ischemic microvascular disease is present. Small old lacunar infarct over the left basal ganglia. Small old infarct over the right occipital region. Hazy region of low-attenuation over the gray-white matter of the posterior right parietal region measuring approximately 4.4 x 5.2 cm likely acute to subacute infarct. There is minimal local mass effect. No evidence of midline shift. No evidence of focal mass or acute hemorrhage. Bilateral basal ganglia calcifications are present. Vascular: No hyperdense vessel or unexpected calcification. Skull: Normal. Negative for fracture or focal lesion. Sinuses/Orbits: No acute finding. Other: None. CT CERVICAL SPINE FINDINGS Alignment: Normal. Skull base and vertebrae: Vertebral body heights are normal. There is mild spondylosis throughout the cervical spine. Atlantoaxial articulation is notable for degenerative changes. There is uncovertebral joint spurring and facet arthropathy. No acute fracture or subluxation. Right-sided neural foraminal narrowing at the C4-5 and C6-7 levels with moderate right-sided neural foraminal narrowing at the C5-6 level. Soft tissues and spinal canal: No prevertebral fluid or swelling. No visible canal hematoma. Disc levels:  Minimal disc space narrowing at the C5-6 level. Upper chest: Negative. Other: Mild esophageal wall thickening over the upper esophagus which may be due to reflux esophagitis. IMPRESSION: 1. Hazy attenuation over the gray-white matter of the right posterior  parietal region likely acute to subacute watershed infarction. Minimal associated local mass effect. No midline shift or acute hemorrhage. 2. Age related atrophic change with small vessel chronic ischemic disease and small old infarcts as described. 3.  No acute cervical spine injury. 4. Mild spondylosis throughout the cervical spine with disc disease at the C5-6 level. Multilevel bilateral neural foraminal narrowing worse at the C5-6 level. 5. Circumferential wall thickening of the upper esophagus likely due to reflux esophagitis. Recommend clinical correlation. Electronically Signed   By: Marin Olp M.D.   On: 06/22/2019 14:38   Ct Cervical Spine Wo Contrast  Result Date: 06/22/2019 CLINICAL DATA:  Minor head trauma. EXAM: CT HEAD WITHOUT CONTRAST CT CERVICAL SPINE WITHOUT CONTRAST TECHNIQUE: Multidetector CT imaging of the head and cervical spine was performed following the standard protocol without intravenous contrast. Multiplanar CT image reconstructions of the cervical spine were also generated. COMPARISON:  Head CT 06/09/2018 and cervical spine CT 05/26/2008 FINDINGS: CT HEAD FINDINGS Brain: Mild prominence of the ventricles, cisterns and other CSF spaces compatible with atrophy. Moderate chronic ischemic microvascular disease is present. Small old lacunar infarct over the left basal ganglia. Small old infarct over the right occipital region. Hazy region of low-attenuation over the gray-white matter of the posterior right parietal region measuring approximately 4.4 x 5.2 cm likely acute to subacute infarct. There is minimal local mass effect. No evidence of midline shift. No evidence of focal mass or acute hemorrhage. Bilateral basal ganglia calcifications are present. Vascular: No hyperdense vessel or unexpected calcification. Skull: Normal. Negative for fracture or focal lesion. Sinuses/Orbits: No acute finding. Other: None. CT CERVICAL SPINE FINDINGS Alignment: Normal. Skull base and vertebrae:  Vertebral body heights are normal. There is mild spondylosis throughout the cervical spine. Atlantoaxial articulation is notable for degenerative changes. There is uncovertebral joint spurring and facet arthropathy. No acute fracture or subluxation. Right-sided neural foraminal narrowing at the C4-5 and C6-7 levels with moderate right-sided neural foraminal narrowing at the C5-6 level. Soft tissues and spinal canal: No prevertebral fluid or swelling. No visible canal hematoma. Disc levels:  Minimal disc space narrowing at the  C5-6 level. Upper chest: Negative. Other: Mild esophageal wall thickening over the upper esophagus which may be due to reflux esophagitis. IMPRESSION: 1. Hazy attenuation over the gray-white matter of the right posterior parietal region likely acute to subacute watershed infarction. Minimal associated local mass effect. No midline shift or acute hemorrhage. 2. Age related atrophic change with small vessel chronic ischemic disease and small old infarcts as described. 3.  No acute cervical spine injury. 4. Mild spondylosis throughout the cervical spine with disc disease at the C5-6 level. Multilevel bilateral neural foraminal narrowing worse at the C5-6 level. 5. Circumferential wall thickening of the upper esophagus likely due to reflux esophagitis. Recommend clinical correlation. Electronically Signed   By: Marin Olp M.D.   On: 06/22/2019 14:38   Dg Pelvis Portable  Result Date: 06/22/2019 CLINICAL DATA:  Fall. EXAM: PORTABLE PELVIS 1-2 VIEWS COMPARISON:  September 26, 2017. FINDINGS: Status post surgical internal fixation of proximal left femoral intertrochanteric fracture. Right hip is unremarkable. No acute fracture or dislocation is noted. IMPRESSION: Postsurgical changes as described above.  No acute abnormality seen. Electronically Signed   By: Marijo Conception M.D.   On: 06/22/2019 15:06   Dg Hand 2 View Left  Result Date: 06/22/2019 CLINICAL DATA:  Fall. EXAM: LEFT HAND - 2  VIEW COMPARISON:  None. FINDINGS: There is no evidence of fracture or dislocation. Extensive degenerative changes are seen involving the radiocarpal, intercarpal and first carpometacarpal joints. Soft tissues are unremarkable. IMPRESSION: Extensive degenerative changes as described above. No acute abnormality seen in the left hand. Electronically Signed   By: Marijo Conception M.D.   On: 06/22/2019 15:12   Dg Humerus Left  Result Date: 06/22/2019 CLINICAL DATA:  Fall. EXAM: LEFT HUMERUS - 2+ VIEW COMPARISON:  None. FINDINGS: There is no evidence of fracture or other focal bone lesions. Soft tissues are unremarkable. IMPRESSION: Negative. Electronically Signed   By: Marijo Conception M.D.   On: 06/22/2019 15:11    ____________________________________________   PROCEDURES  Procedure(s) performed (including Critical Care):  Procedures   ____________________________________________   INITIAL IMPRESSION / ASSESSMENT AND PLAN / ED COURSE  MELVIS PEDLEY was evaluated in Emergency Department on 06/22/2019 for the symptoms described in the history of present illness. She was evaluated in the context of the global COVID-19 pandemic, which necessitated consideration that the patient might be at risk for infection with the SARS-CoV-2 virus that causes COVID-19. Institutional protocols and algorithms that pertain to the evaluation of patients at risk for COVID-19 are in a state of rapid change based on information released by regulatory bodies including the CDC and federal and state organizations. These policies and algorithms were followed during the patient's care in the ED.    Patient is a 85 year old who presents with fall.  History is significantly limited due to patient's dementia.  Unclear if she even fell today.  Given patient's not a great historian will get CT head to evaluate for intracranial hemorrhage.  CT cervical to evaluate for cervical fracture.  Also get repeat x-rays of the left arm to  ensure there is no missed fracture.  Patient has good good pulse no evidence of vascular injury.  She is a very small skin tear.  Her tetanus was already updated.  Will get some basic labs to evaluate for electrolyte abnormalities that could have caused the fall.  Labs reviewed and unremarkable.  Chest x-ray no obvious rib fractures.  Pelvis x-rays no obvious pelvic fracture.  CT scan showed gray-white matter attenuation concerning for acute to subacute watershed infarction with localized mass-effect.  D/w Dr. Marguerita Beards about what she wants to do about new infarction on CT head. She is discussing with family   D/w Dr. Doy Mince from neuro and this area could cause some weakness on the left arm. Recommend d/w POA to see if they want full workup. If they do would admit to hospital for stroke workup.   D/w family and they do want to have further workup for what is causing stroke.  Will d/w hospital for admission.   Patient failed the swallow study so given rectal aspirin.  Patient admitted to the hospital team for stroke work-up    ____________________________________________   FINAL CLINICAL IMPRESSION(S) / ED DIAGNOSES   Final diagnoses:  Cerebrovascular accident (CVA), unspecified mechanism ([place])      MEDICATIONS GIVEN DURING THIS VISIT:  Medications  aspirin suppository 300 mg (has no administration in time range)     ED Discharge Orders    None       Note:  This document was prepared using Dragon voice recognition software and may include unintentional dictation errors.   Vanessa [place], MD 06/22/19 [phone]

## 2019-06-22 NOTE — ED Notes (Signed)
Attempted to call report but was placed on hold

## 2019-06-22 NOTE — ED Notes (Signed)
ED TO INPATIENT HANDOFF REPORT  ED Nurse Name and Phone #: Joelene Millin [phone]  S Name/Age/Gender Kerri Rangel 85 y.o. female Room/Bed: ED18A/ED18A  Code Status   Code Status: Prior  Home/SNF/Other Nursing Home Patient oriented to: self Is this baseline? Yes   Triage Complete: Triage complete  Chief Complaint Arm Weakness   Triage Note Patient presents to the ED via EMS from [place].  Patient has dressing to her left wrist.  EMS states they were called out due to patient not moving her left arm/wrist well.  Left wrist appears swollen.  EMS states Patient has full range of motion to her left arm.  Patient fell two days ago, EMS was unsure if patient fell again today.  Patient has dementia at baseline.  Patient is oriented to self only.     Allergies No Known Allergies  Level of Care/Admitting Diagnosis ED Disposition    ED Disposition Condition [facility] Area: [place] [[id number]]  Level of Care: Med-Surg [16]  Covid Evaluation: Asymptomatic Screening Protocol (No Symptoms)  Diagnosis: CVA (cerebral vascular accident) [facility]) [id number]  Admitting Physician: Henreitta Leber [id number]  Attending Physician: Henreitta Leber [id number]  PT Class (Do Not Modify): Observation [104]  PT Acc Code (Do Not Modify): Observation [10022]       B Medical/Surgery History Past Medical History:  Diagnosis Date  . Anemia   . Anxiety 08/07/2016  . Cancer ([place])    colon ca  . Common bile duct (CBD) obstruction 08/01/2016   Overview:  Possible stone noted 07/26/16 - refer to GI. LFTs nml but does have chronic abd pain   . Dementia ([place])   . Depression 08/07/2016  . Dizzy 03/02/2014  . Edema 03/02/2014  . Gallstone 08/01/2016  . GERD (gastroesophageal reflux disease)   . Heart disease   . Hyperlipidemia, unspecified 08/07/2016  . Hypertension 08/07/2016  . Hypothyroidism 08/07/2016  . Late onset Alzheimer's disease with behavioral  disturbance ([place]) 03/02/2015  . Osteoporosis   . Vitamin D deficiency    Past Surgical History:  Procedure Laterality Date  . ABDOMINAL HYSTERECTOMY    . APPENDECTOMY    . Back sugery  03/2002   lumbar disk and fusion. Dr. Mauri Pole   . BREAST EXCISIONAL BIOPSY     Benign. Dr. Pat Patrick  . BREAST LUMPECTOMY N/A 1971   unsure  . CATARACT EXTRACTION Bilateral   . colon cancer resection  1990s   By Dr. Pat Patrick  . ERCP N/A 08/28/2016   Procedure: ENDOSCOPIC RETROGRADE CHOLANGIOPANCREATOGRAPHY (ERCP);  Surgeon: Lucilla Lame, MD;  Location: [facility] ENDOSCOPY;  Service: Endoscopy;  Laterality: N/A;  . HERNIA REPAIR  1995   ventral  . INTRAMEDULLARY (IM) NAIL INTERTROCHANTERIC Left 09/26/2017   Procedure: INTRAMEDULLARY (IM) NAIL INTERTROCHANTRIC;  Surgeon: Lovell Sheehan, MD;  Location: [facility] ORS;  Service: Orthopedics;  Laterality: Left;  . JOINT REPLACEMENT    . OOPHORECTOMY    . SPINE SURGERY       A IV Location/Drains/Wounds Patient Lines/Drains/Airways Status   Active Line/Drains/Airways    Name:   Placement date:   Placement time:   Site:   Days:   External Urinary Catheter   09/26/17    0030    -   634   Incision (Closed) 09/26/17 Hip Left   09/26/17    1652     634   Wound / Incision (Open or Dehisced) 09/26/17 Laceration   09/26/17    0005    -  634   Wound / Incision (Open or Dehisced) Face Anterior;Right;Upper   -    -    Face             Intake/Output Last 24 hours No intake or output data in the 24 hours ending 06/22/19 2005  Labs/Imaging Results for orders placed or performed during the hospital encounter of 06/22/19 (from the past 48 hour(s))  CBC with Differential     Status: None   Collection Time: 06/22/19  1:35 PM  Result Value Ref Range   WBC 9.5 4.0 - 10.5 K/uL   RBC 4.00 3.87 - 5.11 MIL/uL   Hemoglobin 12.1 12.0 - 15.0 g/dL   HCT 39.0 36.0 - 46.0 %   MCV 97.5 80.0 - 100.0 fL   MCH 30.3 26.0 - 34.0 pg   MCHC 31.0 30.0 - 36.0 g/dL   RDW 13.0 11.5 - 15.5 %   Platelets  212 150 - 400 K/uL   nRBC 0.0 0.0 - 0.2 %   Neutrophils Relative % 76 %   Neutro Abs 7.2 1.7 - 7.7 K/uL   Lymphocytes Relative 13 %   Lymphs Abs 1.2 0.7 - 4.0 K/uL   Monocytes Relative 9 %   Monocytes Absolute 0.8 0.1 - 1.0 K/uL   Eosinophils Relative 1 %   Eosinophils Absolute 0.1 0.0 - 0.5 K/uL   Basophils Relative 1 %   Basophils Absolute 0.1 0.0 - 0.1 K/uL   Immature Granulocytes 0 %   Abs Immature Granulocytes 0.04 0.00 - 0.07 K/uL    Comment: Performed at [facility], [address] [place], [place] [id number]  Basic metabolic panel     Status: Abnormal   Collection Time: 06/22/19  1:35 PM  Result Value Ref Range   Sodium 142 135 - 145 mmol/L   Potassium 3.7 3.5 - 5.1 mmol/L   Chloride 111 98 - 111 mmol/L   CO2 22 22 - 32 mmol/L   Glucose, Bld 172 (H) 70 - 99 mg/dL   BUN 21 8 - 23 mg/dL   Creatinine, Ser 0.89 0.44 - 1.00 mg/dL   Calcium 10.5 (H) 8.9 - 10.3 mg/dL   GFR calc non Af Amer 56 (L) >60 mL/min   GFR calc Af Amer >60 >60 mL/min   Anion gap 9 5 - 15    Comment: Performed at [facility], [address], [place], [place] [address]   Dg Chest 1 View  Result Date: 06/22/2019 CLINICAL DATA:  Fall. EXAM: CHEST  1 VIEW COMPARISON:  September 29, 2017. FINDINGS: Stable cardiomediastinal silhouette. Atherosclerosis of thoracic aorta is noted. No pneumothorax or pleural effusion is noted. Bony thorax is unremarkable. No acute pulmonary disease is noted. IMPRESSION: No active disease. Aortic Atherosclerosis (ICD10-I70.0). Electronically Signed   By: Marijo Conception M.D.   On: 06/22/2019 15:04   Dg Forearm Left  Result Date: 06/22/2019 CLINICAL DATA:  Fall. EXAM: LEFT FOREARM - 2 VIEW COMPARISON:  None. FINDINGS: There is no evidence of fracture or other focal bone lesions. Soft tissues are unremarkable. IMPRESSION: Negative. Electronically Signed   By: Marijo Conception M.D.   On: 06/22/2019 15:08   Dg Wrist Complete Left  Result Date:  06/22/2019 CLINICAL DATA:  Fall. EXAM: LEFT WRIST - COMPLETE 3+ VIEW COMPARISON:  None. FINDINGS: No definite fracture or dislocation is noted. There appears to be extensive degenerative change seen involving the radiocarpal and ulnar carpal joints, with subchondral cyst formation. Degenerative changes are seen  involving several intercarpal joints as well as the first carpometacarpal joint. Soft tissues are unremarkable. IMPRESSION: Extensive degenerative changes as described above. No definite acute abnormality seen in the left wrist. Electronically Signed   By: Marijo Conception M.D.   On: 06/22/2019 15:10   Ct Head Wo Contrast  Result Date: 06/22/2019 CLINICAL DATA:  Minor head trauma. EXAM: CT HEAD WITHOUT CONTRAST CT CERVICAL SPINE WITHOUT CONTRAST TECHNIQUE: Multidetector CT imaging of the head and cervical spine was performed following the standard protocol without intravenous contrast. Multiplanar CT image reconstructions of the cervical spine were also generated. COMPARISON:  Head CT 06/09/2018 and cervical spine CT 05/26/2008 FINDINGS: CT HEAD FINDINGS Brain: Mild prominence of the ventricles, cisterns and other CSF spaces compatible with atrophy. Moderate chronic ischemic microvascular disease is present. Small old lacunar infarct over the left basal ganglia. Small old infarct over the right occipital region. Hazy region of low-attenuation over the gray-white matter of the posterior right parietal region measuring approximately 4.4 x 5.2 cm likely acute to subacute infarct. There is minimal local mass effect. No evidence of midline shift. No evidence of focal mass or acute hemorrhage. Bilateral basal ganglia calcifications are present. Vascular: No hyperdense vessel or unexpected calcification. Skull: Normal. Negative for fracture or focal lesion. Sinuses/Orbits: No acute finding. Other: None. CT CERVICAL SPINE FINDINGS Alignment: Normal. Skull base and vertebrae: Vertebral body heights are normal.  There is mild spondylosis throughout the cervical spine. Atlantoaxial articulation is notable for degenerative changes. There is uncovertebral joint spurring and facet arthropathy. No acute fracture or subluxation. Right-sided neural foraminal narrowing at the C4-5 and C6-7 levels with moderate right-sided neural foraminal narrowing at the C5-6 level. Soft tissues and spinal canal: No prevertebral fluid or swelling. No visible canal hematoma. Disc levels:  Minimal disc space narrowing at the C5-6 level. Upper chest: Negative. Other: Mild esophageal wall thickening over the upper esophagus which may be due to reflux esophagitis. IMPRESSION: 1. Hazy attenuation over the gray-white matter of the right posterior parietal region likely acute to subacute watershed infarction. Minimal associated local mass effect. No midline shift or acute hemorrhage. 2. Age related atrophic change with small vessel chronic ischemic disease and small old infarcts as described. 3.  No acute cervical spine injury. 4. Mild spondylosis throughout the cervical spine with disc disease at the C5-6 level. Multilevel bilateral neural foraminal narrowing worse at the C5-6 level. 5. Circumferential wall thickening of the upper esophagus likely due to reflux esophagitis. Recommend clinical correlation. Electronically Signed   By: Marin Olp M.D.   On: 06/22/2019 14:38   Ct Cervical Spine Wo Contrast  Result Date: 06/22/2019 CLINICAL DATA:  Minor head trauma. EXAM: CT HEAD WITHOUT CONTRAST CT CERVICAL SPINE WITHOUT CONTRAST TECHNIQUE: Multidetector CT imaging of the head and cervical spine was performed following the standard protocol without intravenous contrast. Multiplanar CT image reconstructions of the cervical spine were also generated. COMPARISON:  Head CT 06/09/2018 and cervical spine CT 05/26/2008 FINDINGS: CT HEAD FINDINGS Brain: Mild prominence of the ventricles, cisterns and other CSF spaces compatible with atrophy. Moderate chronic  ischemic microvascular disease is present. Small old lacunar infarct over the left basal ganglia. Small old infarct over the right occipital region. Hazy region of low-attenuation over the gray-white matter of the posterior right parietal region measuring approximately 4.4 x 5.2 cm likely acute to subacute infarct. There is minimal local mass effect. No evidence of midline shift. No evidence of focal mass or acute hemorrhage. Bilateral basal ganglia calcifications  are present. Vascular: No hyperdense vessel or unexpected calcification. Skull: Normal. Negative for fracture or focal lesion. Sinuses/Orbits: No acute finding. Other: None. CT CERVICAL SPINE FINDINGS Alignment: Normal. Skull base and vertebrae: Vertebral body heights are normal. There is mild spondylosis throughout the cervical spine. Atlantoaxial articulation is notable for degenerative changes. There is uncovertebral joint spurring and facet arthropathy. No acute fracture or subluxation. Right-sided neural foraminal narrowing at the C4-5 and C6-7 levels with moderate right-sided neural foraminal narrowing at the C5-6 level. Soft tissues and spinal canal: No prevertebral fluid or swelling. No visible canal hematoma. Disc levels:  Minimal disc space narrowing at the C5-6 level. Upper chest: Negative. Other: Mild esophageal wall thickening over the upper esophagus which may be due to reflux esophagitis. IMPRESSION: 1. Hazy attenuation over the gray-white matter of the right posterior parietal region likely acute to subacute watershed infarction. Minimal associated local mass effect. No midline shift or acute hemorrhage. 2. Age related atrophic change with small vessel chronic ischemic disease and small old infarcts as described. 3.  No acute cervical spine injury. 4. Mild spondylosis throughout the cervical spine with disc disease at the C5-6 level. Multilevel bilateral neural foraminal narrowing worse at the C5-6 level. 5. Circumferential wall thickening  of the upper esophagus likely due to reflux esophagitis. Recommend clinical correlation. Electronically Signed   By: Marin Olp M.D.   On: 06/22/2019 14:38   Dg Pelvis Portable  Result Date: 06/22/2019 CLINICAL DATA:  Fall. EXAM: PORTABLE PELVIS 1-2 VIEWS COMPARISON:  September 26, 2017. FINDINGS: Status post surgical internal fixation of proximal left femoral intertrochanteric fracture. Right hip is unremarkable. No acute fracture or dislocation is noted. IMPRESSION: Postsurgical changes as described above.  No acute abnormality seen. Electronically Signed   By: Marijo Conception M.D.   On: 06/22/2019 15:06   Dg Hand 2 View Left  Result Date: 06/22/2019 CLINICAL DATA:  Fall. EXAM: LEFT HAND - 2 VIEW COMPARISON:  None. FINDINGS: There is no evidence of fracture or dislocation. Extensive degenerative changes are seen involving the radiocarpal, intercarpal and first carpometacarpal joints. Soft tissues are unremarkable. IMPRESSION: Extensive degenerative changes as described above. No acute abnormality seen in the left hand. Electronically Signed   By: Marijo Conception M.D.   On: 06/22/2019 15:12   Dg Humerus Left  Result Date: 06/22/2019 CLINICAL DATA:  Fall. EXAM: LEFT HUMERUS - 2+ VIEW COMPARISON:  None. FINDINGS: There is no evidence of fracture or other focal bone lesions. Soft tissues are unremarkable. IMPRESSION: Negative. Electronically Signed   By: Marijo Conception M.D.   On: 06/22/2019 15:11    Pending Labs Unresulted Labs (From admission, onward)    Start     Ordered   06/23/19 0500  Hemoglobin A1c  Tomorrow morning,   STAT     06/22/19 1906   06/23/19 0500  Lipid panel  Tomorrow morning,   STAT    Comments: Fasting    06/22/19 1906   06/22/19 1703  SARS CORONAVIRUS 2 (TAT 6-24 HRS) Nasopharyngeal Nasopharyngeal Swab  (Asymptomatic/Tier 2 Patients Labs)  Once,   STAT    Question Answer Comment  Is this test for diagnosis or screening Screening   Symptomatic for COVID-19 as defined  by CDC No   Hospitalized for COVID-19 No   Admitted to ICU for COVID-19 No   Previously tested for COVID-19 No   Resident in a congregate (group) care setting Yes   Employed in healthcare setting No   Pregnant No  06/22/19 1702   Signed and Held  Basic metabolic panel  Tomorrow morning,   R     Signed and Held   Signed and Held  CBC  Tomorrow morning,   R     Signed and Held   Signed and Held  CBC  (enoxaparin (LOVENOX)    CrCl >/= 30 ml/min)  Once,   R    Comments: Baseline for enoxaparin therapy IF NOT ALREADY DRAWN.  Notify MD if PLT < 100 K.    Signed and Held   Signed and Held  Creatinine, serum  (enoxaparin (LOVENOX)    CrCl >/= 30 ml/min)  Once,   R    Comments: Baseline for enoxaparin therapy IF NOT ALREADY DRAWN.    Signed and Held   Signed and Held  Creatinine, serum  (enoxaparin (LOVENOX)    CrCl >/= 30 ml/min)  Weekly,   R    Comments: while on enoxaparin therapy    Signed and Held          Vitals/Pain Today's Vitals   06/22/19 1800 06/22/19 1900 06/22/19 1915 06/22/19 1930  BP: (!) 122/51 (!) 148/61  (!) 130/47  Pulse:      Resp: (!) 21 16 (!) 22 (!) 22  Temp:      TempSrc:      SpO2:      Weight:      Height:        Isolation Precautions No active isolations  Medications Medications   stroke: mapping our early stages of recovery book (has no administration in time range)  aspirin suppository 300 mg (300 mg Rectal Given 06/22/19 1838)    Mobility non-ambulatory High fall risk   Focused Assessments Neuro Assessment Handoff:  Swallow screen pass? No   NIH Stroke Scale ( + Modified Stroke Scale Criteria)  LOC Questions (1b. )   +: Answers neither question correctly LOC Commands (1c. )   + : Performs neither task correctly Best Gaze (2. )  +: Partial gaze palsy Visual (3. )  +: No visual loss Motor Arm, Left (5a. )   +: No effort against gravity Motor Arm, Right (5b. )   +: Drift Motor Leg, Left (6a. )   +: Drift Motor Leg, Right (6b.  )   +: Drift Sensory (8. )   +: Normal, no sensory loss Best Language (9. )   +: No aphasia Extinction/Inattention (11.)   +: No Abnormality Modified SS Total  +: 11     Neuro Assessment:   Neuro Checks:      Last Documented NIHSS Modified Score: 11 (06/22/19 1724) Has TPA been given? No If patient is a Neuro Trauma and patient is going to OR before floor call report to [place] nurse: [phone] or [phone]     R Recommendations: See Admitting Provider Note  Report given to:   Additional Notes:

## 2019-06-22 NOTE — H&P (Signed)
[facility] [place] NAME: Kerri Rangel    MR#:  [id number]  DATE OF BIRTH:  [birth year]  DATE OF ADMISSION:  06/22/2019  PRIMARY CARE PHYSICIAN: [facility]   REQUESTING/REFERRING PHYSICIAN: Dr. Marjean Donna  CHIEF COMPLAINT:   Chief Complaint  Patient presents with   Arm Problem  left upper Ext. weakness  HISTORY OF PRESENT ILLNESS:  Kerri Rangel  is a 85 y.o. female with a known history of colon cancer, anxiety, depression, history of dementia, hypertension hyperlipidemia, hypothyroidism who presents to the hospital due to left upper extremity weakness.  Patient had a fall a few days back and had some injury to her left arm.  Her left wrist was somewhat swollen but today the staff at the assisted living noticed that she was not moving her left arm very well.  She was therefore sent to the ER for further evaluation.  Patient underwent a CT of the head here in the ER which showed a possible sided parietal infarct.  ER physician spoke to patient's family/daughter who recommended further work-up for the stroke.  Patient herself is a very poor historian therefore most of the history obtained from the ER physician and from the family.  Patient's COVID-19 test is still pending.  She is clinically asymptomatic.  PAST MEDICAL HISTORY:   Past Medical History:  Diagnosis Date   Anemia    Anxiety 08/07/2016   Cancer ([place])    colon ca   Common bile duct (CBD) obstruction 08/01/2016   Overview:  Possible stone noted 07/26/16 - refer to GI. LFTs nml but does have chronic abd pain    Dementia ([place])    Depression 08/07/2016   Dizzy 03/02/2014   Edema 03/02/2014   Gallstone 08/01/2016   GERD (gastroesophageal reflux disease)    Heart disease    Hyperlipidemia, unspecified 08/07/2016   Hypertension 08/07/2016   Hypothyroidism 08/07/2016   Late onset Alzheimer's disease with behavioral disturbance ([place]) 03/02/2015    Osteoporosis    Vitamin D deficiency     PAST SURGICAL HISTORY:   Past Surgical History:  Procedure Laterality Date   ABDOMINAL HYSTERECTOMY     APPENDECTOMY     Back sugery  03/2002   lumbar disk and fusion. Dr. Mauri Pole    BREAST EXCISIONAL BIOPSY     Benign. Dr. Pat Patrick   BREAST LUMPECTOMY N/A 1971   unsure   CATARACT EXTRACTION Bilateral    colon cancer resection  1990s   By Dr. Pat Patrick   ERCP N/A 08/28/2016   Procedure: ENDOSCOPIC RETROGRADE CHOLANGIOPANCREATOGRAPHY (ERCP);  Surgeon: Lucilla Lame, MD;  Location: [facility] ENDOSCOPY;  Service: Endoscopy;  Laterality: N/A;   HERNIA REPAIR  1995   ventral   INTRAMEDULLARY (IM) NAIL INTERTROCHANTERIC Left 09/26/2017   Procedure: INTRAMEDULLARY (IM) NAIL INTERTROCHANTRIC;  Surgeon: Lovell Sheehan, MD;  Location: [facility] ORS;  Service: Orthopedics;  Laterality: Left;   JOINT REPLACEMENT     OOPHORECTOMY     SPINE SURGERY      SOCIAL HISTORY:   Social History   Tobacco Use   Smoking status: Never Smoker   Smokeless tobacco: Never Used  Substance Use Topics   Alcohol use: No    FAMILY HISTORY:   Family History  Problem Relation Age of Onset   Diabetes Mother    Heart attack Mother    Cancer Sister    Diabetes Sister    Alcohol abuse Brother    Diabetes Brother  Heart attack Brother    Stroke Brother     DRUG ALLERGIES:  No Known Allergies  REVIEW OF SYSTEMS:   Review of Systems  Unable to perform ROS: Mental acuity    MEDICATIONS AT HOME:   Prior to Admission medications   Medication Sig Start Date End Date Taking? Authorizing Provider  aspirin EC 81 MG EC tablet Take 1 tablet (81 mg total) by mouth daily. 09/30/17  Yes Wieting, Richard, MD  atenolol (TENORMIN) 50 MG tablet Take 50 mg by mouth daily.    Yes [provider]  busPIRone (BUSPAR) 15 MG tablet Take 15 mg by mouth 2 (two) times daily. 05/29/19  Yes [provider]  docusate sodium (COLACE) 100 MG capsule Take 1  capsule (100 mg total) by mouth 2 (two) times daily. 09/29/17  Yes Wieting, Richard, MD  donepezil (ARICEPT) 5 MG tablet Take 5 mg by mouth every morning.    Yes [provider]  fentaNYL (DURAGESIC - DOSED MCG/HR) 12 MCG/HR Place 1 patch (12.5 mcg total) onto the skin every 3 (three) days. 09/29/17  Yes Wieting, Richard, MD  ferrous sulfate 325 (65 FE) MG tablet Take 325 mg by mouth daily with breakfast.   Yes [provider]  levothyroxine (SYNTHROID, LEVOTHROID) 50 MCG tablet Take 50 mcg by mouth daily before breakfast.   Yes [provider]  LORazepam (ATIVAN) 0.5 MG tablet Take 0.5 mg by mouth daily as needed for anxiety.   Yes [provider]  mirtazapine (REMERON) 30 MG tablet Take 30 mg by mouth at bedtime.    Yes [provider]  pantoprazole (PROTONIX) 40 MG tablet Take 40 mg by mouth daily.   Yes [provider]  saccharomyces boulardii (FLORASTOR) 250 MG capsule Take 250 mg by mouth 2 (two) times daily.   Yes [provider]  sertraline (ZOLOFT) 25 MG tablet Take 25 mg by mouth daily.   Yes [provider]  vitamin B-12 (CYANOCOBALAMIN) 1000 MCG tablet Take 1,000 mcg by mouth daily.   Yes [provider]  acetaminophen (TYLENOL) 325 MG tablet Take 650 mg by mouth every 4 (four) hours as needed for mild pain or moderate pain.    [provider]  acetaminophen (TYLENOL) 500 MG tablet Take 500 mg by mouth every 6 (six) hours as needed for mild pain, fever or headache.    [provider]  aluminum-magnesium hydroxide-simethicone (MAALOX) [id number] MG/5ML SUSP Take 30 mLs by mouth every 6 (six) hours as needed (for heartburn and indigestion).    [provider]  loperamide (IMODIUM) 2 MG capsule Take 2 mg by mouth as needed for diarrhea or loose stools.    [provider]  magnesium hydroxide (MILK OF MAGNESIA) 400 MG/5ML suspension Take 30 mLs by mouth at bedtime as needed for mild  constipation or moderate constipation.    [provider]  neomycin-bacitracin-polymyxin (NEOSPORIN) [phone] ointment Apply 1 application topically as needed (for wound care).    [provider]      VITAL SIGNS:  Blood pressure (!) 122/44, pulse 67, temperature 97.8 F (36.6 C), temperature source Oral, resp. rate 17, height 5\' 5"  (1.651 m), weight 68 kg, SpO2 100 %.  PHYSICAL EXAMINATION:  Physical Exam  GENERAL:  85 y.o.-year-old patient lying in the bed with no acute distress.  EYES: Pupils equal, round, reactive to light and accommodation. No scleral icterus. Extraocular muscles intact.  HEENT: Head atraumatic, normocephalic. Oropharynx and nasopharynx clear. No oropharyngeal erythema, moist  oral mucosa  NECK:  Supple, no jugular venous distention. No thyroid enlargement, no tenderness.  LUNGS: Normal breath sounds bilaterally, no wheezing, rales, rhonchi. No use of accessory muscles of respiration.  CARDIOVASCULAR: S1, S2 RRR. No murmurs, rubs, gallops, clicks.  ABDOMEN: Soft, nontender, nondistended. Bowel sounds present. No organomegaly or mass.  EXTREMITIES: No pedal edema, cyanosis, or clubbing. + 2 pedal & radial pulses b/l.   NEUROLOGIC: Cranial nerves II through XII are intact. No focal Motor or sensory deficits appreciated b/l. Globally weak.  PSYCHIATRIC: The patient is alert and oriented x 1.  SKIN: No obvious rash, lesion, or ulcer.   LABORATORY PANEL:   CBC Recent Labs  Lab 06/22/19 1335  WBC 9.5  HGB 12.1  HCT 39.0  PLT 212   ------------------------------------------------------------------------------------------------------------------  Chemistries  Recent Labs  Lab 06/22/19 1335  NA 142  K 3.7  CL 111  CO2 22  GLUCOSE 172*  BUN 21  CREATININE 0.89  CALCIUM 10.5*   ------------------------------------------------------------------------------------------------------------------  Cardiac Enzymes No results for input(s):  TROPONINI in the last 168 hours. ------------------------------------------------------------------------------------------------------------------  RADIOLOGY:  Dg Chest 1 View  Result Date: 06/22/2019 CLINICAL DATA:  Fall. EXAM: CHEST  1 VIEW COMPARISON:  September 29, 2017. FINDINGS: Stable cardiomediastinal silhouette. Atherosclerosis of thoracic aorta is noted. No pneumothorax or pleural effusion is noted. Bony thorax is unremarkable. No acute pulmonary disease is noted. IMPRESSION: No active disease. Aortic Atherosclerosis (ICD10-I70.0). Electronically Signed   By: Marijo Conception M.D.   On: 06/22/2019 15:04   Dg Forearm Left  Result Date: 06/22/2019 CLINICAL DATA:  Fall. EXAM: LEFT FOREARM - 2 VIEW COMPARISON:  None. FINDINGS: There is no evidence of fracture or other focal bone lesions. Soft tissues are unremarkable. IMPRESSION: Negative. Electronically Signed   By: Marijo Conception M.D.   On: 06/22/2019 15:08   Dg Wrist Complete Left  Result Date: 06/22/2019 CLINICAL DATA:  Fall. EXAM: LEFT WRIST - COMPLETE 3+ VIEW COMPARISON:  None. FINDINGS: No definite fracture or dislocation is noted. There appears to be extensive degenerative change seen involving the radiocarpal and ulnar carpal joints, with subchondral cyst formation. Degenerative changes are seen involving several intercarpal joints as well as the first carpometacarpal joint. Soft tissues are unremarkable. IMPRESSION: Extensive degenerative changes as described above. No definite acute abnormality seen in the left wrist. Electronically Signed   By: Marijo Conception M.D.   On: 06/22/2019 15:10   Ct Head Wo Contrast  Result Date: 06/22/2019 CLINICAL DATA:  Minor head trauma. EXAM: CT HEAD WITHOUT CONTRAST CT CERVICAL SPINE WITHOUT CONTRAST TECHNIQUE: Multidetector CT imaging of the head and cervical spine was performed following the standard protocol without intravenous contrast. Multiplanar CT image reconstructions of the cervical  spine were also generated. COMPARISON:  Head CT 06/09/2018 and cervical spine CT 05/26/2008 FINDINGS: CT HEAD FINDINGS Brain: Mild prominence of the ventricles, cisterns and other CSF spaces compatible with atrophy. Moderate chronic ischemic microvascular disease is present. Small old lacunar infarct over the left basal ganglia. Small old infarct over the right occipital region. Hazy region of low-attenuation over the gray-white matter of the posterior right parietal region measuring approximately 4.4 x 5.2 cm likely acute to subacute infarct. There is minimal local mass effect. No evidence of midline shift. No evidence of focal mass or acute hemorrhage. Bilateral basal ganglia calcifications are present. Vascular: No hyperdense vessel or unexpected calcification. Skull: Normal. Negative for fracture or focal lesion. Sinuses/Orbits: No acute finding. Other: None. CT CERVICAL SPINE FINDINGS  Alignment: Normal. Skull base and vertebrae: Vertebral body heights are normal. There is mild spondylosis throughout the cervical spine. Atlantoaxial articulation is notable for degenerative changes. There is uncovertebral joint spurring and facet arthropathy. No acute fracture or subluxation. Right-sided neural foraminal narrowing at the C4-5 and C6-7 levels with moderate right-sided neural foraminal narrowing at the C5-6 level. Soft tissues and spinal canal: No prevertebral fluid or swelling. No visible canal hematoma. Disc levels:  Minimal disc space narrowing at the C5-6 level. Upper chest: Negative. Other: Mild esophageal wall thickening over the upper esophagus which may be due to reflux esophagitis. IMPRESSION: 1. Hazy attenuation over the gray-white matter of the right posterior parietal region likely acute to subacute watershed infarction. Minimal associated local mass effect. No midline shift or acute hemorrhage. 2. Age related atrophic change with small vessel chronic ischemic disease and small old infarcts as  described. 3.  No acute cervical spine injury. 4. Mild spondylosis throughout the cervical spine with disc disease at the C5-6 level. Multilevel bilateral neural foraminal narrowing worse at the C5-6 level. 5. Circumferential wall thickening of the upper esophagus likely due to reflux esophagitis. Recommend clinical correlation. Electronically Signed   By: Marin Olp M.D.   On: 06/22/2019 14:38   Ct Cervical Spine Wo Contrast  Result Date: 06/22/2019 CLINICAL DATA:  Minor head trauma. EXAM: CT HEAD WITHOUT CONTRAST CT CERVICAL SPINE WITHOUT CONTRAST TECHNIQUE: Multidetector CT imaging of the head and cervical spine was performed following the standard protocol without intravenous contrast. Multiplanar CT image reconstructions of the cervical spine were also generated. COMPARISON:  Head CT 06/09/2018 and cervical spine CT 05/26/2008 FINDINGS: CT HEAD FINDINGS Brain: Mild prominence of the ventricles, cisterns and other CSF spaces compatible with atrophy. Moderate chronic ischemic microvascular disease is present. Small old lacunar infarct over the left basal ganglia. Small old infarct over the right occipital region. Hazy region of low-attenuation over the gray-white matter of the posterior right parietal region measuring approximately 4.4 x 5.2 cm likely acute to subacute infarct. There is minimal local mass effect. No evidence of midline shift. No evidence of focal mass or acute hemorrhage. Bilateral basal ganglia calcifications are present. Vascular: No hyperdense vessel or unexpected calcification. Skull: Normal. Negative for fracture or focal lesion. Sinuses/Orbits: No acute finding. Other: None. CT CERVICAL SPINE FINDINGS Alignment: Normal. Skull base and vertebrae: Vertebral body heights are normal. There is mild spondylosis throughout the cervical spine. Atlantoaxial articulation is notable for degenerative changes. There is uncovertebral joint spurring and facet arthropathy. No acute fracture or  subluxation. Right-sided neural foraminal narrowing at the C4-5 and C6-7 levels with moderate right-sided neural foraminal narrowing at the C5-6 level. Soft tissues and spinal canal: No prevertebral fluid or swelling. No visible canal hematoma. Disc levels:  Minimal disc space narrowing at the C5-6 level. Upper chest: Negative. Other: Mild esophageal wall thickening over the upper esophagus which may be due to reflux esophagitis. IMPRESSION: 1. Hazy attenuation over the gray-white matter of the right posterior parietal region likely acute to subacute watershed infarction. Minimal associated local mass effect. No midline shift or acute hemorrhage. 2. Age related atrophic change with small vessel chronic ischemic disease and small old infarcts as described. 3.  No acute cervical spine injury. 4. Mild spondylosis throughout the cervical spine with disc disease at the C5-6 level. Multilevel bilateral neural foraminal narrowing worse at the C5-6 level. 5. Circumferential wall thickening of the upper esophagus likely due to reflux esophagitis. Recommend clinical correlation. Electronically Signed  By: Marin Olp M.D.   On: 06/22/2019 14:38   Dg Pelvis Portable  Result Date: 06/22/2019 CLINICAL DATA:  Fall. EXAM: PORTABLE PELVIS 1-2 VIEWS COMPARISON:  September 26, 2017. FINDINGS: Status post surgical internal fixation of proximal left femoral intertrochanteric fracture. Right hip is unremarkable. No acute fracture or dislocation is noted. IMPRESSION: Postsurgical changes as described above.  No acute abnormality seen. Electronically Signed   By: Marijo Conception M.D.   On: 06/22/2019 15:06   Dg Hand 2 View Left  Result Date: 06/22/2019 CLINICAL DATA:  Fall. EXAM: LEFT HAND - 2 VIEW COMPARISON:  None. FINDINGS: There is no evidence of fracture or dislocation. Extensive degenerative changes are seen involving the radiocarpal, intercarpal and first carpometacarpal joints. Soft tissues are unremarkable. IMPRESSION:  Extensive degenerative changes as described above. No acute abnormality seen in the left hand. Electronically Signed   By: Marijo Conception M.D.   On: 06/22/2019 15:12   Dg Humerus Left  Result Date: 06/22/2019 CLINICAL DATA:  Fall. EXAM: LEFT HUMERUS - 2+ VIEW COMPARISON:  None. FINDINGS: There is no evidence of fracture or other focal bone lesions. Soft tissues are unremarkable. IMPRESSION: Negative. Electronically Signed   By: Marijo Conception M.D.   On: 06/22/2019 15:11     IMPRESSION AND PLAN:   85 y.o. female with a known history of colon cancer, anxiety, depression, history of dementia, hypertension hyperlipidemia, hypothyroidism who presents to the hospital due to left upper extremity weakness.  1.  CVA/TIA-patient presents to the hospital with left upper extremity weakness.  Patient CT head suggestive of a right parietal subacute CVA.   -We will get MRI of the brain, carotid duplex, echocardiogram. -Continue aspirin, get a neurology consult. -Get physical therapy evaluation but patient is nonambulatory at baseline.  2.  Essential hypertension-continue atenolol.  3.  Hypothyroidism-continue Synthroid.   4.  GERD-continue Protonix.  5.  Depression-continue Zoloft.  6.  Dementia-continue Aricept.  Discussed plan of care with patient's daughter at bedside.  All the records are reviewed and case discussed with ED provider. Management plans discussed with the patient, family and they are in agreement.  CODE STATUS: DNR  TOTAL TIME TAKING CARE OF THIS PATIENT: 40 minutes.    Henreitta Leber M.D on 06/22/2019 at 6:58 PM  Between 7am to 6pm - Pager - [phone]  After 6pm go to [email or web address] - password [email or web address] [facility]  [facility]  Office  [phone]  CC: Primary care physician; [facility]

## 2019-06-22 NOTE — ED Notes (Signed)
Pt otf for imaging 

## 2019-06-22 NOTE — ED Notes (Signed)
Report given to Sylvia, RN

## 2019-06-22 NOTE — ED Notes (Signed)
Daughter at bedside, brief that was soiled with urine was changed and patient cleaned up

## 2019-06-22 NOTE — ED Notes (Signed)
Pt brief and pants soiled with stool, pt cleaned up and fresh brief and linens provided

## 2019-06-22 NOTE — ED Triage Notes (Signed)
Patient presents to the ED via EMS from [place].  Patient has dressing to her left wrist.  EMS states they were called out due to patient not moving her left arm/wrist well.  Left wrist appears swollen.  EMS states Patient has full range of motion to her left arm.  Patient fell two days ago, EMS was unsure if patient fell again today.  Patient has dementia at baseline.  Patient is oriented to self only.

## 2019-06-23 ENCOUNTER — Inpatient Hospital Stay (HOSPITAL_COMMUNITY)
Admit: 2019-06-23 | Discharge: 2019-06-23 | Disposition: A | Discharge status: Home / Self Care | Payer: PPO | Attending: Specialist | Admitting: Specialist

## 2019-06-23 ENCOUNTER — Inpatient Hospital Stay: Payer: PPO

## 2019-06-23 DIAGNOSIS — K2100 Gastro-esophageal reflux disease with esophagitis, without bleeding: Secondary | ICD-10-CM | POA: Diagnosis present

## 2019-06-23 DIAGNOSIS — R531 Weakness: Secondary | ICD-10-CM | POA: Diagnosis present

## 2019-06-23 DIAGNOSIS — Z993 Dependence on wheelchair: Secondary | ICD-10-CM | POA: Diagnosis not present

## 2019-06-23 DIAGNOSIS — I10 Essential (primary) hypertension: Secondary | ICD-10-CM | POA: Diagnosis present

## 2019-06-23 DIAGNOSIS — I441 Atrioventricular block, second degree: Secondary | ICD-10-CM | POA: Diagnosis present

## 2019-06-23 DIAGNOSIS — I639 Cerebral infarction, unspecified: Secondary | ICD-10-CM | POA: Diagnosis not present

## 2019-06-23 DIAGNOSIS — S61412A Laceration without foreign body of left hand, initial encounter: Secondary | ICD-10-CM | POA: Diagnosis present

## 2019-06-23 DIAGNOSIS — W050XXA Fall from non-moving wheelchair, initial encounter: Secondary | ICD-10-CM | POA: Diagnosis present

## 2019-06-23 DIAGNOSIS — I340 Nonrheumatic mitral (valve) insufficiency: Secondary | ICD-10-CM | POA: Diagnosis not present

## 2019-06-23 DIAGNOSIS — Z7401 Bed confinement status: Secondary | ICD-10-CM | POA: Diagnosis not present

## 2019-06-23 DIAGNOSIS — R29707 NIHSS score 7: Secondary | ICD-10-CM | POA: Diagnosis present

## 2019-06-23 DIAGNOSIS — G8929 Other chronic pain: Secondary | ICD-10-CM | POA: Diagnosis present

## 2019-06-23 DIAGNOSIS — G301 Alzheimer's disease with late onset: Secondary | ICD-10-CM | POA: Diagnosis present

## 2019-06-23 DIAGNOSIS — R001 Bradycardia, unspecified: Secondary | ICD-10-CM | POA: Diagnosis not present

## 2019-06-23 DIAGNOSIS — Z7189 Other specified counseling: Secondary | ICD-10-CM | POA: Diagnosis not present

## 2019-06-23 DIAGNOSIS — F0281 Dementia in other diseases classified elsewhere with behavioral disturbance: Secondary | ICD-10-CM | POA: Diagnosis present

## 2019-06-23 DIAGNOSIS — M25532 Pain in left wrist: Secondary | ICD-10-CM | POA: Diagnosis present

## 2019-06-23 DIAGNOSIS — M810 Age-related osteoporosis without current pathological fracture: Secondary | ICD-10-CM | POA: Diagnosis present

## 2019-06-23 DIAGNOSIS — R1310 Dysphagia, unspecified: Secondary | ICD-10-CM | POA: Diagnosis present

## 2019-06-23 DIAGNOSIS — R8271 Bacteriuria: Secondary | ICD-10-CM | POA: Diagnosis not present

## 2019-06-23 DIAGNOSIS — Z515 Encounter for palliative care: Secondary | ICD-10-CM | POA: Diagnosis not present

## 2019-06-23 DIAGNOSIS — E039 Hypothyroidism, unspecified: Secondary | ICD-10-CM | POA: Diagnosis present

## 2019-06-23 DIAGNOSIS — G8324 Monoplegia of upper limb affecting left nondominant side: Secondary | ICD-10-CM | POA: Diagnosis present

## 2019-06-23 DIAGNOSIS — Y92099 Unspecified place in other non-institutional residence as the place of occurrence of the external cause: Secondary | ICD-10-CM | POA: Diagnosis not present

## 2019-06-23 DIAGNOSIS — Z20828 Contact with and (suspected) exposure to other viral communicable diseases: Secondary | ICD-10-CM | POA: Diagnosis present

## 2019-06-23 DIAGNOSIS — F0390 Unspecified dementia without behavioral disturbance: Secondary | ICD-10-CM | POA: Diagnosis not present

## 2019-06-23 DIAGNOSIS — I63411 Cerebral infarction due to embolism of right middle cerebral artery: Secondary | ICD-10-CM | POA: Diagnosis present

## 2019-06-23 DIAGNOSIS — F419 Anxiety disorder, unspecified: Secondary | ICD-10-CM | POA: Diagnosis present

## 2019-06-23 DIAGNOSIS — E785 Hyperlipidemia, unspecified: Secondary | ICD-10-CM | POA: Diagnosis present

## 2019-06-23 DIAGNOSIS — Z23 Encounter for immunization: Secondary | ICD-10-CM | POA: Diagnosis present

## 2019-06-23 DIAGNOSIS — F329 Major depressive disorder, single episode, unspecified: Secondary | ICD-10-CM | POA: Diagnosis present

## 2019-06-23 DIAGNOSIS — Z66 Do not resuscitate: Secondary | ICD-10-CM | POA: Diagnosis present

## 2019-06-23 LAB — BASIC METABOLIC PANEL
Anion gap: 6 (ref 5–15)
BUN: 19 mg/dL (ref 8–23)
CO2: 23 mmol/L (ref 22–32)
Calcium: 10.1 mg/dL (ref 8.9–10.3)
Chloride: 114 mmol/L — ABNORMAL HIGH (ref 98–111)
Creatinine, Ser: 0.65 mg/dL (ref 0.44–1.00)
GFR calc Af Amer: >60 mL/min (ref >60)
GFR calc non Af Amer: >60 mL/min (ref >60)
Glucose, Bld: 92 mg/dL (ref 70–99)
Potassium: 3.5 mmol/L (ref 3.5–5.1)
Sodium: 143 mmol/L (ref 135–145)

## 2019-06-23 LAB — MRSA PCR SCREENING: MRSA by PCR: NEGATIVE

## 2019-06-23 LAB — HEMOGLOBIN A1C
Hgb A1c MFr Bld: 4.9 % (ref 4.8–5.6)
Mean Plasma Glucose: 93.93 mg/dL

## 2019-06-23 LAB — CBC
HCT: 35.3 % — ABNORMAL LOW (ref 36.0–46.0)
Hemoglobin: 11.3 g/dL — ABNORMAL LOW (ref 12.0–15.0)
MCH: 30.5 pg (ref 26.0–34.0)
MCHC: 32 g/dL (ref 30.0–36.0)
MCV: 95.4 fL (ref 80.0–100.0)
Platelets: 190 10*3/uL (ref 150–400)
RBC: 3.7 MIL/uL — ABNORMAL LOW (ref 3.87–5.11)
RDW: 13.1 % (ref 11.5–15.5)
WBC: 7.8 10*3/uL (ref 4.0–10.5)
nRBC: 0 % (ref 0.0–0.2)

## 2019-06-23 LAB — LIPID PANEL
Cholesterol: 172 mg/dL (ref 0–200)
HDL: 39 mg/dL — ABNORMAL LOW (ref >40)
LDL Cholesterol: 111 mg/dL — ABNORMAL HIGH (ref 0–99)
Total CHOL/HDL Ratio: 4.4 RATIO
Triglycerides: 110 mg/dL (ref <150)
VLDL: 22 mg/dL (ref 0–40)

## 2019-06-23 LAB — URINALYSIS, COMPLETE (UACMP) WITH MICROSCOPIC
Bilirubin Urine: NEGATIVE
Glucose, UA: NEGATIVE mg/dL
Hgb urine dipstick: NEGATIVE
Ketones, ur: NEGATIVE mg/dL
Nitrite: POSITIVE — AB
Protein, ur: 100 mg/dL — AB
Specific Gravity, Urine: 1.023 (ref 1.005–1.030)
WBC, UA: >50 WBC/hpf — ABNORMAL HIGH (ref 0–5)
pH: 6 (ref 5.0–8.0)

## 2019-06-23 LAB — ECHOCARDIOGRAM COMPLETE
Height: 65 in
Weight: 2400 oz

## 2019-06-23 LAB — SARS CORONAVIRUS 2 (TAT 6-24 HRS): SARS Coronavirus 2: NEGATIVE

## 2019-06-23 MED ORDER — SODIUM CHLORIDE 0.9 % IV SOLN
INTRAVENOUS | Status: DC
  Administered 2019-06-23 – 2019-06-25 (×7): via INTRAVENOUS

## 2019-06-23 MED ORDER — SODIUM CHLORIDE 0.9 % IV SOLN
1.0000 g | INTRAVENOUS | Status: AC
  Administered 2019-06-23 – 2019-06-25 (×3): 1 g via INTRAVENOUS
  Filled 2019-06-23 (×3): qty 1

## 2019-06-23 MED ORDER — ATORVASTATIN CALCIUM 20 MG PO TABS
40.0000 mg | ORAL_TABLET | Freq: Every day | ORAL | Status: DC
  Administered 2019-06-23 – 2019-07-02 (×10): 40 mg via ORAL
  Filled 2019-06-23 (×9): qty 2

## 2019-06-23 NOTE — Progress Notes (Signed)
*  PRELIMINARY RESULTS* Echocardiogram 2D Echocardiogram has been performed.  Kerri Rangel Kerri Rangel 06/23/2019, 11:39 AM

## 2019-06-23 NOTE — Plan of Care (Signed)
  Problem: Nutrition: Goal: Risk of aspiration will decrease Outcome: Progressing   Problem: Ischemic Stroke/TIA Tissue Perfusion: Goal: Complications of ischemic stroke/TIA will be minimized Outcome: Progressing   Problem: Clinical Measurements: Goal: Ability to maintain clinical measurements within normal limits will improve Outcome: Progressing Goal: Will remain free from infection Outcome: Progressing   Problem: Activity: Goal: Risk for activity intolerance will decrease Outcome: Progressing   Problem: Coping: Goal: Level of anxiety will decrease Outcome: Progressing   Problem: Elimination: Goal: Will not experience complications related to urinary retention Outcome: Progressing   Problem: Pain Managment: Goal: General experience of comfort will improve Outcome: Progressing   Problem: Safety: Goal: Ability to remain free from injury will improve Outcome: Progressing   Problem: Skin Integrity: Goal: Risk for impaired skin integrity will decrease Outcome: Progressing

## 2019-06-23 NOTE — Evaluation (Signed)
Physical Therapy Evaluation Patient Details Name: Kerri Rangel MRN: [id number] DOB: [birth year] Today's Date: 06/23/2019   History of Present Illness  pt is a 85 yo female admitted for stroke work up after presenting to ED from [place] with left arm weakness, head CT showed possible sided parietal infarct. PMH: colon cancer, anxiety, depression, dementia, HTN, HLD, hypothyroidism  Clinical Impression  Pt is a 85 yo female admitted for stroke. Pt in bed upon arrival. Pt confused and disoriented, oriented to self only. Per chart review, pt is a poor historian and with confusion it is unclear her exact PLOF. Pt denied any visual changes. Pt presents with severe pain in LUE and L knee. Pt also presents with swelling and bruising on left hand and wrist. Pt able to follow one step commands inconsistently with increased time. Pt performed LE therex in supine with frequent vc/tc for correct performance. Pt required max encouragement and assist to get EOB. Pt presents with strong posterior lean in sitting and appears to have little awareness of posterior lean. Pt presents with pain, decreased strength, ROM, balance and activity tolerance limiting functional mobility. Pt will benefit from skilled acute therapy to improve deficits. Recommendation for SNF following hospital discharge to improve deficits, increase independence, decrease fall risk and caregiver burden.     Follow Up Recommendations SNF    Equipment Recommendations  Other (comment)(TBD)    Recommendations for Other Services       Precautions / Restrictions Precautions Precautions: Fall Restrictions Weight Bearing Restrictions: No      Mobility  Bed Mobility Overal bed mobility: Needs Assistance Bed Mobility: Supine to Sit     Supine to sit: Max assist     General bed mobility comments: pt required max A to get EOB, x2 trials first trial did not achieve full upright sitting secondary to pt pain and stating she needed to go back in  the bed, second trial able to get fully EOB, pt required cuing for initiation and sequencing  Transfers                 General transfer comment: pt refused  Ambulation/Gait             General Gait Details: pt refused  Stairs            Wheelchair Mobility    Modified Rankin (Stroke Patients Only) Modified Rankin (Stroke Patients Only) Pre-Morbid Rankin Score: Moderate disability Modified Rankin: Severe disability     Balance Overall balance assessment: Needs assistance;History of Falls Sitting-balance support: Feet supported Sitting balance-Leahy Scale: Zero Sitting balance - Comments: pt reuired max A to maintain sitting EOB, pt with strong posterior lean requiring max A Postural control: Posterior lean                                   Pertinent Vitals/Pain Pain Assessment: Faces Faces Pain Scale: Hurts worst Pain Location: left knee, left wrist and hand Pain Descriptors / Indicators: Sharp;Grimacing;Moaning;Aching Pain Intervention(s): Limited activity within patient's tolerance;Monitored during session;Repositioned    Home Living Family/patient expects to be discharged to:: Assisted living                 Additional Comments: pt denied having any DME    Prior Function Level of Independence: Needs assistance         Comments: pt is poort historian per chart review and disoriented during session, unclear how much  assist she requires at baseline, pt reported independent with dressing and bathing     Hand Dominance        Extremity/Trunk Assessment   Upper Extremity Assessment Upper Extremity Assessment: Generalized weakness;LUE deficits/detail LUE Deficits / Details: unable to fully assess secondary to severe pain, LUE mostly flaccid during eval LUE: Unable to fully assess due to pain    Lower Extremity Assessment Lower Extremity Assessment: Generalized weakness(difficult to assess secondary to pt cognition, pt  able to perform AP, SLR without assistance)       Communication   Communication: No difficulties  Cognition Arousal/Alertness: Awake/alert Behavior During Therapy: WFL for tasks assessed/performed Overall Cognitive Status: History of cognitive impairments - at baseline                                        General Comments General comments (skin integrity, edema, etc.): increased swelling LUE and LLE, LUE with a lot of bruising and appears to have injury in left wrist/hand    Exercises Total Joint Exercises Ankle Circles/Pumps: AROM;Both;10 reps Heel Slides: AROM;Both;10 reps Straight Leg Raises: AROM;Both;10 reps   Assessment/Plan    PT Assessment Patient needs continued PT services  PT Problem List Decreased strength;Decreased safety awareness;Decreased mobility;Decreased range of motion;Decreased activity tolerance;Decreased balance;Pain;Decreased knowledge of use of DME       PT Treatment Interventions DME instruction;Therapeutic exercise;Gait training;Balance training;Stair training;Neuromuscular re-education;Functional mobility training;Therapeutic activities;Patient/family education;Cognitive remediation    PT Goals (Current goals can be found in the Care Plan section)  Acute Rehab PT Goals PT Goal Formulation: Patient unable to participate in goal setting    Frequency 7X/week   Barriers to discharge        Co-evaluation               AM-PAC PT "6 Clicks" Mobility  Outcome Measure Help needed turning from your back to your side while in a flat bed without using bedrails?: A Lot Help needed moving from lying on your back to sitting on the side of a flat bed without using bedrails?: A Lot Help needed moving to and from a bed to a chair (including a wheelchair)?: Total Help needed standing up from a chair using your arms (e.g., wheelchair or bedside chair)?: Total Help needed to walk in hospital room?: Total Help needed climbing 3-5 steps  with a railing? : Total 6 Click Score: 8    End of Session Equipment Utilized During Treatment: Gait belt Activity Tolerance: Patient tolerated treatment well;Patient limited by pain Patient left: in bed;with call bell/phone within reach Nurse Communication: Mobility status PT Visit Diagnosis: Muscle weakness (generalized) (M62.81);Difficulty in walking, not elsewhere classified (R26.2);Pain Pain - Right/Left: Left Pain - part of body: Leg;Arm;Hand    Time: [id number] PT Time Calculation (min) (ACUTE ONLY): 24 min   Charges:   PT Evaluation $PT Eval Low Complexity: 1 Low         Sherril Heyward PT, DPT 1:16 PM,06/23/19 [phone]

## 2019-06-23 NOTE — Progress Notes (Signed)
OT Cancellation Note  Patient Details Name: Kerri Rangel MRN: [id number] DOB: 10-19-1923   Cancelled Treatment:    Reason Eval/Treat Not Completed: Fatigue/lethargy limiting ability to participate. Thank you for the OT consult. Order received and chart reviewed. Per SLP and PT, who had seen pt prior to OT arrival to unit, pt is lethargic, pain limited, and has difficulty following 1-step VC's. OT entered pt room to introduce self, and pt was noted to stare straight ahead with limited recognition that OT had entered room. Pt states "not too good" when asked how she is feeling this AM, but otherwise does not respond to this therapist. Pt unable to meaningfully participate in OT evaluation at this time. Will follow acutely and re-attempt at a later time/date as available and pt appropriate for OT evaluation.   Shara Blazing, M.S., OTR/L Ascom: [phone] 06/23/19, 10:37 AM

## 2019-06-23 NOTE — Evaluation (Addendum)
Clinical/Bedside Swallow Evaluation Patient Details  Name: Kerri Rangel MRN: [id number] Date of Birth: 1923-11-25  Today's Date: 06/23/2019 Time: SLP Start Time (ACUTE ONLY): 0933 SLP Stop Time (ACUTE ONLY): 1030 SLP Time Calculation (min) (ACUTE ONLY): 57 min  Past Medical History:  Past Medical History:  Diagnosis Date  . Anemia   . Anxiety 08/07/2016  . Cancer ([place])    colon ca  . Common bile duct (CBD) obstruction 08/01/2016   Overview:  Possible stone noted 07/26/16 - refer to GI. LFTs nml but does have chronic abd pain   . Dementia ([place])   . Depression 08/07/2016  . Dizzy 03/02/2014  . Edema 03/02/2014  . Gallstone 08/01/2016  . GERD (gastroesophageal reflux disease)   . Heart disease   . Hyperlipidemia, unspecified 08/07/2016  . Hypertension 08/07/2016  . Hypothyroidism 08/07/2016  . Late onset Alzheimer's disease with behavioral disturbance ([place]) 03/02/2015  . Osteoporosis   . Vitamin D deficiency    Past Surgical History:  Past Surgical History:  Procedure Laterality Date  . ABDOMINAL HYSTERECTOMY    . APPENDECTOMY    . Back sugery  03/2002   lumbar disk and fusion. Dr. Mauri Pole   . BREAST EXCISIONAL BIOPSY     Benign. Dr. Pat Patrick  . BREAST LUMPECTOMY N/A 1971   unsure  . CATARACT EXTRACTION Bilateral   . colon cancer resection  1990s   By Dr. Pat Patrick  . ERCP N/A 08/28/2016   Procedure: ENDOSCOPIC RETROGRADE CHOLANGIOPANCREATOGRAPHY (ERCP);  Surgeon: Lucilla Lame, MD;  Location: [facility] ENDOSCOPY;  Service: Endoscopy;  Laterality: N/A;  . HERNIA REPAIR  1995   ventral  . INTRAMEDULLARY (IM) NAIL INTERTROCHANTERIC Left 09/26/2017   Procedure: INTRAMEDULLARY (IM) NAIL INTERTROCHANTRIC;  Surgeon: Lovell Sheehan, MD;  Location: [facility] ORS;  Service: Orthopedics;  Laterality: Left;  . JOINT REPLACEMENT    . OOPHORECTOMY    . SPINE SURGERY     HPI:  Pt is an 85 y.o. female with a history of Dementia, HTN and HLD who presents with left sided weakness.  Patient unable to provide  any history therefore all history obtained from the chart.  Patient had a fall a few days back and had injury to her left arm.  Her left wrist was somewhat swollen but the staff at the assisted living noticed that she was not moving her left arm very well.  Patient was brought in for evaluation.  MRI: "Large acute posterior right MCA territory infarct without, generalized atrophy".  CXR: no acute disease.    Assessment / Plan / Recommendation Clinical Impression  Pt appears to present w/ grossly adequate oropharyngeal phase swallow function w/ adequate bolus management and swallowing -- No immediate, overt clinical s/s of aspiration during the few trials consumed/accepted(purees, thin liquids). Pt only accepted a few trials total w/ this SLP. She was easily distracted and appeared fatigued. She appeared to be guarding her Left hand. She answered a few Basic questions re: self but was minimally verbal w/ SLP. She followed Basic instructions w/ guidance from SLP during self-feeding tasks - required Mod+ cues. Pt does have a Baseline of Dementia and in light of a new CVA(R MCA) and need for feeding support at meals, she is at increased risk for aspiration. During the few trials of ice chips, thin liquids and purees accepted, pt exhibited No overt s/s of aspiration - no coughing or decline in vocal quality, respiratory effort. Laryngeal excursion WFL. During the oral phase, pt exhibited good bolus control and management;  timely A-P transfer and oral clearing w/ all trials. OM exam revealed no unilateral weakness; no drift/pull during speech or w/ bolus management. Loose U/L Dentures. Feeding Support necessary but pt was able to Hold Cup to drink herself(increases safety).  Recommend a pureed diet w/ thin liquids via Cup at this time; aspiration precautions; Pills in Puree for safer swallowing; support at all meals. Must make sure Dentures are secure in mouth w/ adhesive or do not use.   Recommend holding on any  Cognitive evaluation until pt returns to living setting in order to assess if any changes from her baseline and need for Cognitive evaluation(pt does have a baseline of Dementia). Any assessment done in her "home" setting would give more information as staff would be available for information as well.  SLP Visit Diagnosis: Dysphagia, unspecified (R13.10)    Aspiration Risk  Mild aspiration risk;Risk for inadequate nutrition/hydration(reduced w/ precautions)    Diet Recommendation  Dysphagia level 1 (puree) w/ thin liquids VIA CUP only - help pt support to hold when drinking; aspiration precautions; feeding assistance at all meals d/t sore LUE; Dementia.  Medication Administration: Crushed with puree(for safer swallowing)    Other  Recommendations Recommended Consults: (Dietician f/u for support) Oral Care Recommendations: Oral care BID;Staff/trained caregiver to provide oral care Other Recommendations: (n/a)   Follow up Recommendations (TBD)      Frequency and Duration min 3x week  2 weeks       Prognosis Prognosis for Safe Diet Advancement: Fair Barriers to Reach Goals: Cognitive deficits;Time post onset;Severity of deficits(Loose Dentures)      Swallow Study   General Date of Onset: 06/22/19 HPI: Pt is an 85 y.o. female with a history of Dementia, HTN and HLD who presents with left sided weakness.  Patient unable to provide any history therefore all history obtained from the chart.  Patient had a fall a few days back and had injury to her left arm.  Her left wrist was somewhat swollen but the staff at the assisted living noticed that she was not moving her left arm very well.  Patient was brought in for evaluation.  MRI: "Large acute posterior right MCA territory infarct without, generalized atrophy".  CXR: no acute disease.  Type of Study: Bedside Swallow Evaluation Previous Swallow Assessment: none reported Diet Prior to this Study: NPO Temperature Spikes Noted: No(wbc  7.8) Respiratory Status: Room air History of Recent Intubation: No Behavior/Cognition: Alert;Cooperative;Pleasant mood;Confused;Distractible;Requires cueing(min verbal) Oral Cavity Assessment: Within Functional Limits Oral Care Completed by SLP: Yes Oral Cavity - Dentition: Dentures, top;Dentures, bottom(loose) Vision: Functional for self-feeding(to hold cup to drink) Self-Feeding Abilities: Able to feed self;Needs assist;Needs set up;Total assist(to hold cup to drink) Patient Positioning: Upright in bed(needed positioning) Baseline Vocal Quality: Low vocal intensity Volitional Cough: Cognitively unable to elicit Volitional Swallow: Unable to elicit    Oral/Motor/Sensory Function Overall Oral Motor/Sensory Function: Within functional limits(no unilateral weakness; pull/drift during intake/speech)   Ice Chips Ice chips: Within functional limits Presentation: Spoon(fed; 5 trials) Other Comments: chewed the ice chips   Thin Liquid Thin Liquid: Within functional limits Presentation: Cup;Self Fed(supported; 6 trials only) Other Comments: pt declined further    Nectar Thick Nectar Thick Liquid: Not tested   Honey Thick Honey Thick Liquid: Not tested   Puree Puree: Within functional limits Presentation: Spoon(fed; 6 trials) Other Comments: pt declined further   Solid     Solid: Not tested Other Comments: loose dentures        Orinda Kenner, MS,  CCC-SLP Watson,Katherine 06/23/2019,3:17 PM

## 2019-06-23 NOTE — Consult Note (Signed)
Referring Physician: Stark Jock    Chief Complaint: LUE weakness  HPI: Kerri Rangel is an 85 y.o. female with a history of dementia, HTN and HLD who presents with left sided weakness.  Patient unable to provide any history therefore all history obtained from the chart.  Patient had a fall a few days back and had some injury to her left arm.  Her left wrist was somewhat swollen but today the staff at the assisted living noticed that she was not moving her left arm very well.  Patient was brought in for evaluation.  Initial NIHSS of 7.  Date last known well: Unable to determine Time last known well: Unable to determine tPA Given: No: Unable to determine LKW  Past Medical History:  Diagnosis Date  . Anemia   . Anxiety 08/07/2016  . Cancer ([place])    colon ca  . Common bile duct (CBD) obstruction 08/01/2016   Overview:  Possible stone noted 07/26/16 - refer to GI. LFTs nml but does have chronic abd pain   . Dementia ([place])   . Depression 08/07/2016  . Dizzy 03/02/2014  . Edema 03/02/2014  . Gallstone 08/01/2016  . GERD (gastroesophageal reflux disease)   . Heart disease   . Hyperlipidemia, unspecified 08/07/2016  . Hypertension 08/07/2016  . Hypothyroidism 08/07/2016  . Late onset Alzheimer's disease with behavioral disturbance ([place]) 03/02/2015  . Osteoporosis   . Vitamin D deficiency     Past Surgical History:  Procedure Laterality Date  . ABDOMINAL HYSTERECTOMY    . APPENDECTOMY    . Back sugery  03/2002   lumbar disk and fusion. Dr. Mauri Pole   . BREAST EXCISIONAL BIOPSY     Benign. Dr. Pat Patrick  . BREAST LUMPECTOMY N/A 1971   unsure  . CATARACT EXTRACTION Bilateral   . colon cancer resection  1990s   By Dr. Pat Patrick  . ERCP N/A 08/28/2016   Procedure: ENDOSCOPIC RETROGRADE CHOLANGIOPANCREATOGRAPHY (ERCP);  Surgeon: Lucilla Lame, MD;  Location: [facility] ENDOSCOPY;  Service: Endoscopy;  Laterality: N/A;  . HERNIA REPAIR  1995   ventral  . INTRAMEDULLARY (IM) NAIL INTERTROCHANTERIC Left 09/26/2017    Procedure: INTRAMEDULLARY (IM) NAIL INTERTROCHANTRIC;  Surgeon: Lovell Sheehan, MD;  Location: [facility] ORS;  Service: Orthopedics;  Laterality: Left;  . JOINT REPLACEMENT    . OOPHORECTOMY    . SPINE SURGERY      Family History  Problem Relation Age of Onset  . Diabetes Mother   . Heart attack Mother   . Cancer Sister   . Diabetes Sister   . Alcohol abuse Brother   . Diabetes Brother   . Heart attack Brother   . Stroke Brother    Social History:  reports that she has never smoked. She has never used smokeless tobacco. She reports that she does not drink alcohol or use drugs.  Allergies: No Known Allergies  Medications:  I have reviewed the patient's current medications. Prior to Admission:  Medications Prior to Admission  Medication Sig Dispense Refill Last Dose  . aspirin EC 81 MG EC tablet Take 1 tablet (81 mg total) by mouth daily. 30 tablet 0 06/22/2019 at 0912  . atenolol (TENORMIN) 50 MG tablet Take 50 mg by mouth daily.    06/21/2019 at 0855  . busPIRone (BUSPAR) 15 MG tablet Take 15 mg by mouth 2 (two) times daily.   06/22/2019 at 0912  . docusate sodium (COLACE) 100 MG capsule Take 1 capsule (100 mg total) by mouth 2 (two) times daily.  60 capsule 0 06/22/2019 at 0912  . donepezil (ARICEPT) 5 MG tablet Take 5 mg by mouth every morning.    06/21/2019 at 1544  . fentaNYL (DURAGESIC - DOSED MCG/HR) 12 MCG/HR Place 1 patch (12.5 mcg total) onto the skin every 3 (three) days. 10 patch 0 06/22/2019 at 0958  . ferrous sulfate 325 (65 FE) MG tablet Take 325 mg by mouth daily with breakfast.   06/22/2019 at 0912  . levothyroxine (SYNTHROID, LEVOTHROID) 50 MCG tablet Take 50 mcg by mouth daily before breakfast.   06/22/2019 at 0516  . LORazepam (ATIVAN) 0.5 MG tablet Take 0.5 mg by mouth daily as needed for anxiety.   06/21/2019 at 0758  . mirtazapine (REMERON) 30 MG tablet Take 30 mg by mouth at bedtime.    06/21/2019 at [place]  . pantoprazole (PROTONIX) 40 MG tablet Take 40 mg by  mouth daily.   06/22/2019 at 0911  . saccharomyces boulardii (FLORASTOR) 250 MG capsule Take 250 mg by mouth 2 (two) times daily.   06/22/2019 at 0912  . sertraline (ZOLOFT) 25 MG tablet Take 25 mg by mouth daily.   06/22/2019 at 0912  . vitamin B-12 (CYANOCOBALAMIN) 1000 MCG tablet Take 1,000 mcg by mouth daily.   06/22/2019 at 0912  . acetaminophen (TYLENOL) 325 MG tablet Take 650 mg by mouth every 4 (four) hours as needed for mild pain or moderate pain.   prn at prn  . acetaminophen (TYLENOL) 500 MG tablet Take 500 mg by mouth every 6 (six) hours as needed for mild pain, fever or headache.   prn at prn  . aluminum-magnesium hydroxide-simethicone (MAALOX) 200-200-20 MG/5ML SUSP Take 30 mLs by mouth every 6 (six) hours as needed (for heartburn and indigestion).   prn at prn  . loperamide (IMODIUM) 2 MG capsule Take 2 mg by mouth as needed for diarrhea or loose stools.   prn at prn  . magnesium hydroxide (MILK OF MAGNESIA) 400 MG/5ML suspension Take 30 mLs by mouth at bedtime as needed for mild constipation or moderate constipation.   prn at prn  . neomycin-bacitracin-polymyxin (NEOSPORIN) [phone] ointment Apply 1 application topically as needed (for wound care).   prn at prn   Scheduled: . aspirin  81 mg Oral Daily  . atenolol  50 mg Oral Daily  . atorvastatin  40 mg Oral q1800  . busPIRone  15 mg Oral BID  . docusate sodium  100 mg Oral BID  . donepezil  5 mg Oral q morning - 10a  . enoxaparin (LOVENOX) injection  40 mg Subcutaneous Q24H  . [START ON 06/25/2019] fentaNYL  1 patch Transdermal Q72H  . ferrous sulfate  325 mg Oral Q breakfast  . levothyroxine  50 mcg Oral QAC breakfast  . mirtazapine  30 mg Oral QHS  . pantoprazole  40 mg Oral Daily  . saccharomyces boulardii  250 mg Oral BID  . sertraline  25 mg Oral Daily  . vitamin B-12  1,000 mcg Oral Daily    ROS: Unable to obtain due to mental status  Physical Examination: Blood pressure (!) 122/37, pulse (!) 54, temperature  97.6 F (36.4 C), resp. rate 16, height 5\' 5"  (1.651 m), weight 68 kg, SpO2 100 %.  HEENT-  Normocephalic, no lesions, without obvious abnormality.  Normal external eye and conjunctiva.  Normal TM's bilaterally.  Normal auditory canals and external ears. Normal external nose, mucus membranes and septum.  Normal pharynx. Cardiovascular- S1, S2 normal, pulses palpable throughout   Lungs-  chest clear, no wheezing, rales, normal symmetric air entry Abdomen- soft, non-tender; bowel sounds normal; no masses,  no organomegaly Extremities- mild LE edema Lymph-no adenopathy palpable Musculoskeletal-no joint tenderness, deformity or swelling Skin-warm and dry, no hyperpigmentation, vitiligo, or suspicious lesions  Neurological Examination   Mental Status: Alert but keeps eyes closed.  Unintelligible speech.  Follows some commands but for the most part is uncooperative.   Cranial Nerves: II: Blinks to bilateral confrontation III,IV, VI: Intact oculocephalic maneuvers V,VII: face symmetric VIII: hearing normal bilaterally IX,X: gag reflex unable to be tested XI: bilateral shoulder shrug XII: midline tongue extension Motor: Although weakly moves the right upper and lower extremities. No movement noted of the LUE and LLE.   Sensory: Responds to noxious stimuli throughout Deep Tendon Reflexes: Symmetric throughout Plantars: Right: mute   Left: mute Cerebellar: Unable to perform Gait: not tested due to safety concerns    Laboratory Studies:  Basic Metabolic Panel: Recent Labs  Lab 06/22/19 1335 06/23/19 0452  NA 142 143  K 3.7 3.5  CL 111 114*  CO2 22 23  GLUCOSE 172* 92  BUN 21 19  CREATININE 0.89 0.65  CALCIUM 10.5* 10.1    Liver Function Tests: No results for input(s): AST, ALT, ALKPHOS, BILITOT, PROT, ALBUMIN in the last 168 hours. No results for input(s): LIPASE, AMYLASE in the last 168 hours. No results for input(s): AMMONIA in the last 168 hours.  CBC: Recent Labs  Lab  06/22/19 1335 06/23/19 0452  WBC 9.5 7.8  NEUTROABS 7.2  --   HGB 12.1 11.3*  HCT 39.0 35.3*  MCV 97.5 95.4  PLT 212 190    Cardiac Enzymes: No results for input(s): CKTOTAL, CKMB, CKMBINDEX, TROPONINI in the last 168 hours.  BNP: Invalid input(s): POCBNP  CBG: No results for input(s): GLUCAP in the last 168 hours.  Microbiology: Results for orders placed or performed during the hospital encounter of 06/22/19  SARS CORONAVIRUS 2 (TAT 6-24 HRS) Nasopharyngeal Nasopharyngeal Swab     Status: None   Collection Time: 06/22/19  5:29 PM   Specimen: Nasopharyngeal Swab  Result Value Ref Range Status   SARS Coronavirus 2 NEGATIVE NEGATIVE Final    Comment: (NOTE) SARS-CoV-2 target nucleic acids are NOT DETECTED. The SARS-CoV-2 RNA is generally detectable in upper and lower respiratory specimens during the acute phase of infection. Negative results do not preclude SARS-CoV-2 infection, do not rule out co-infections with other pathogens, and should not be used as the sole basis for treatment or other patient management decisions. Negative results must be combined with clinical observations, patient history, and epidemiological information. The expected result is Negative. Fact Sheet for Patients: SugarRoll.be Fact Sheet for Healthcare Providers: https://www.woods-mathews.com/ This test is not yet approved or cleared by the [place] FDA and  has been authorized for detection and/or diagnosis of SARS-CoV-2 by FDA under an Emergency Use Authorization (EUA). This EUA will remain  in effect (meaning this test can be used) for the duration of the COVID-19 declaration under Section 56 4(b)(1) of the Act, 21 U.S.C. section 360bbb-3(b)(1), unless the authorization is terminated or revoked sooner. Performed at [facility], [place] [address] [place], [place] [address]   MRSA PCR Screening     Status: None   Collection Time: 06/23/19  12:05 AM   Specimen: Nasopharyngeal  Result Value Ref Range Status   MRSA by PCR NEGATIVE NEGATIVE Final    Comment:        The GeneXpert MRSA Assay (FDA  approved for NASAL specimens only), is one component of a comprehensive MRSA colonization surveillance program. It is not intended to diagnose MRSA infection nor to guide or monitor treatment for MRSA infections. Performed at [facility], [address] [place],  [address]     Coagulation Studies: No results for input(s): LABPROT, INR in the last 72 hours.  Urinalysis:  Recent Labs  Lab 06/23/19 0539  COLORURINE AMBER*  LABSPEC 1.023  PHURINE 6.0  GLUCOSEU NEGATIVE  HGBUR NEGATIVE  BILIRUBINUR NEGATIVE  KETONESUR NEGATIVE  PROTEINUR 100*  NITRITE POSITIVE*  LEUKOCYTESUR MODERATE*    Lipid Panel:    Component Value Date/Time   CHOL 172 06/23/2019 0452   TRIG 110 06/23/2019 0452   HDL 39 (L) 06/23/2019 0452   CHOLHDL 4.4 06/23/2019 0452   VLDL 22 06/23/2019 0452   LDLCALC 111 (H) 06/23/2019 0452    HgbA1C:  Lab Results  Component Value Date   HGBA1C 4.9 06/23/2019    Urine Drug Screen:      Component Value Date/Time   LABOPIA POSITIVE 02/24/2013 0005   COCAINSCRNUR NEGATIVE 02/24/2013 0005   LABBENZ POSITIVE 02/24/2013 0005   AMPHETMU NEGATIVE 02/24/2013 0005   THCU NEGATIVE 02/24/2013 0005   LABBARB NEGATIVE 02/24/2013 0005    Alcohol Level: No results for input(s): ETH in the last 168 hours.  Other results: EKG: sinus rhythm at 68 bpm.  Imaging: Dg Chest 1 View  Result Date: 06/22/2019 CLINICAL DATA:  Fall. EXAM: CHEST  1 VIEW COMPARISON:  September 29, 2017. FINDINGS: Stable cardiomediastinal silhouette. Atherosclerosis of thoracic aorta is noted. No pneumothorax or pleural effusion is noted. Bony thorax is unremarkable. No acute pulmonary disease is noted. IMPRESSION: No active disease. Aortic Atherosclerosis (ICD10-I70.0). Electronically Signed   By: Marijo Conception M.D.    On: 06/22/2019 15:04   Dg Forearm Left  Result Date: 06/22/2019 CLINICAL DATA:  Fall. EXAM: LEFT FOREARM - 2 VIEW COMPARISON:  None. FINDINGS: There is no evidence of fracture or other focal bone lesions. Soft tissues are unremarkable. IMPRESSION: Negative. Electronically Signed   By: Marijo Conception M.D.   On: 06/22/2019 15:08   Dg Wrist Complete Left  Result Date: 06/22/2019 CLINICAL DATA:  Fall. EXAM: LEFT WRIST - COMPLETE 3+ VIEW COMPARISON:  None. FINDINGS: No definite fracture or dislocation is noted. There appears to be extensive degenerative change seen involving the radiocarpal and ulnar carpal joints, with subchondral cyst formation. Degenerative changes are seen involving several intercarpal joints as well as the first carpometacarpal joint. Soft tissues are unremarkable. IMPRESSION: Extensive degenerative changes as described above. No definite acute abnormality seen in the left wrist. Electronically Signed   By: Marijo Conception M.D.   On: 06/22/2019 15:10   Ct Head Wo Contrast  Result Date: 06/22/2019 CLINICAL DATA:  Minor head trauma. EXAM: CT HEAD WITHOUT CONTRAST CT CERVICAL SPINE WITHOUT CONTRAST TECHNIQUE: Multidetector CT imaging of the head and cervical spine was performed following the standard protocol without intravenous contrast. Multiplanar CT image reconstructions of the cervical spine were also generated. COMPARISON:  Head CT 06/09/2018 and cervical spine CT 05/26/2008 FINDINGS: CT HEAD FINDINGS Brain: Mild prominence of the ventricles, cisterns and other CSF spaces compatible with atrophy. Moderate chronic ischemic microvascular disease is present. Small old lacunar infarct over the left basal ganglia. Small old infarct over the right occipital region. Hazy region of low-attenuation over the gray-white matter of the posterior right parietal region measuring approximately 4.4 x 5.2 cm likely acute to  subacute infarct. There is minimal local mass effect. No evidence of  midline shift. No evidence of focal mass or acute hemorrhage. Bilateral basal ganglia calcifications are present. Vascular: No hyperdense vessel or unexpected calcification. Skull: Normal. Negative for fracture or focal lesion. Sinuses/Orbits: No acute finding. Other: None. CT CERVICAL SPINE FINDINGS Alignment: Normal. Skull base and vertebrae: Vertebral body heights are normal. There is mild spondylosis throughout the cervical spine. Atlantoaxial articulation is notable for degenerative changes. There is uncovertebral joint spurring and facet arthropathy. No acute fracture or subluxation. Right-sided neural foraminal narrowing at the C4-5 and C6-7 levels with moderate right-sided neural foraminal narrowing at the C5-6 level. Soft tissues and spinal canal: No prevertebral fluid or swelling. No visible canal hematoma. Disc levels:  Minimal disc space narrowing at the C5-6 level. Upper chest: Negative. Other: Mild esophageal wall thickening over the upper esophagus which may be due to reflux esophagitis. IMPRESSION: 1. Hazy attenuation over the gray-white matter of the right posterior parietal region likely acute to subacute watershed infarction. Minimal associated local mass effect. No midline shift or acute hemorrhage. 2. Age related atrophic change with small vessel chronic ischemic disease and small old infarcts as described. 3.  No acute cervical spine injury. 4. Mild spondylosis throughout the cervical spine with disc disease at the C5-6 level. Multilevel bilateral neural foraminal narrowing worse at the C5-6 level. 5. Circumferential wall thickening of the upper esophagus likely due to reflux esophagitis. Recommend clinical correlation. Electronically Signed   By: Marin Olp M.D.   On: 06/22/2019 14:38   Ct Cervical Spine Wo Contrast  Result Date: 06/22/2019 CLINICAL DATA:  Minor head trauma. EXAM: CT HEAD WITHOUT CONTRAST CT CERVICAL SPINE WITHOUT CONTRAST TECHNIQUE: Multidetector CT imaging of the  head and cervical spine was performed following the standard protocol without intravenous contrast. Multiplanar CT image reconstructions of the cervical spine were also generated. COMPARISON:  Head CT 06/09/2018 and cervical spine CT 05/26/2008 FINDINGS: CT HEAD FINDINGS Brain: Mild prominence of the ventricles, cisterns and other CSF spaces compatible with atrophy. Moderate chronic ischemic microvascular disease is present. Small old lacunar infarct over the left basal ganglia. Small old infarct over the right occipital region. Hazy region of low-attenuation over the gray-white matter of the posterior right parietal region measuring approximately 4.4 x 5.2 cm likely acute to subacute infarct. There is minimal local mass effect. No evidence of midline shift. No evidence of focal mass or acute hemorrhage. Bilateral basal ganglia calcifications are present. Vascular: No hyperdense vessel or unexpected calcification. Skull: Normal. Negative for fracture or focal lesion. Sinuses/Orbits: No acute finding. Other: None. CT CERVICAL SPINE FINDINGS Alignment: Normal. Skull base and vertebrae: Vertebral body heights are normal. There is mild spondylosis throughout the cervical spine. Atlantoaxial articulation is notable for degenerative changes. There is uncovertebral joint spurring and facet arthropathy. No acute fracture or subluxation. Right-sided neural foraminal narrowing at the C4-5 and C6-7 levels with moderate right-sided neural foraminal narrowing at the C5-6 level. Soft tissues and spinal canal: No prevertebral fluid or swelling. No visible canal hematoma. Disc levels:  Minimal disc space narrowing at the C5-6 level. Upper chest: Negative. Other: Mild esophageal wall thickening over the upper esophagus which may be due to reflux esophagitis. IMPRESSION: 1. Hazy attenuation over the gray-white matter of the right posterior parietal region likely acute to subacute watershed infarction. Minimal associated local mass  effect. No midline shift or acute hemorrhage. 2. Age related atrophic change with small vessel chronic ischemic disease and small old infarcts  as described. 3.  No acute cervical spine injury. 4. Mild spondylosis throughout the cervical spine with disc disease at the C5-6 level. Multilevel bilateral neural foraminal narrowing worse at the C5-6 level. 5. Circumferential wall thickening of the upper esophagus likely due to reflux esophagitis. Recommend clinical correlation. Electronically Signed   By: Marin Olp M.D.   On: 06/22/2019 14:38   Mr Brain Wo Contrast  Result Date: 06/22/2019 CLINICAL DATA:  Left upper extremity weakness EXAM: MRI HEAD WITHOUT CONTRAST TECHNIQUE: Multiplanar, multiecho pulse sequences of the brain and surrounding structures were obtained without intravenous contrast. COMPARISON:  Head CT 06/22/2019 and brain MRI 05/27/2008 FINDINGS: BRAIN: There is a large acute infarct of the posterior right MCA territory. No hemorrhage or mass effect. No other area of ischemia. There is an old right occipital infarct. Diffuse confluent white matter hyperintensity, most often a result of chronic microvascular ischemia. There is generalized atrophy without lobar predilection. There is an old small vessel infarct of the left deep gray nuclei. VASCULAR: The major intracranial arterial and venous sinus flow voids are normal. Susceptibility-sensitive sequences show no chronic microhemorrhage or superficial siderosis. SKULL AND UPPER CERVICAL SPINE: Calvarial bone marrow signal is normal. There is no skull base mass. The visualized upper cervical spine and soft tissues are normal. SINUSES/ORBITS: There are no fluid levels or advanced mucosal thickening. The mastoid air cells and middle ear cavities are free of fluid. The orbits are normal. IMPRESSION: 1. Large acute posterior right MCA territory infarct without hemorrhage or mass effect. 2. Old right occipital infarct and severe chronic small vessel  ischemia. Electronically Signed   By: Ulyses Jarred M.D.   On: 06/22/2019 23:21   Dg Pelvis Portable  Result Date: 06/22/2019 CLINICAL DATA:  Fall. EXAM: PORTABLE PELVIS 1-2 VIEWS COMPARISON:  September 26, 2017. FINDINGS: Status post surgical internal fixation of proximal left femoral intertrochanteric fracture. Right hip is unremarkable. No acute fracture or dislocation is noted. IMPRESSION: Postsurgical changes as described above.  No acute abnormality seen. Electronically Signed   By: Marijo Conception M.D.   On: 06/22/2019 15:06   Dg Hand 2 View Left  Result Date: 06/22/2019 CLINICAL DATA:  Fall. EXAM: LEFT HAND - 2 VIEW COMPARISON:  None. FINDINGS: There is no evidence of fracture or dislocation. Extensive degenerative changes are seen involving the radiocarpal, intercarpal and first carpometacarpal joints. Soft tissues are unremarkable. IMPRESSION: Extensive degenerative changes as described above. No acute abnormality seen in the left hand. Electronically Signed   By: Marijo Conception M.D.   On: 06/22/2019 15:12   US Carotid Bilateral  Result Date: 06/23/2019 CLINICAL DATA:  85 year old female with new large right posterior MCA territory infarct EXAM: BILATERAL CAROTID DUPLEX ULTRASOUND TECHNIQUE: Pearline Cables scale imaging, color Doppler and duplex ultrasound were performed of bilateral carotid and vertebral arteries in the neck. COMPARISON:  Prior carotid duplex ultrasound 05/27/2008 FINDINGS: Criteria: Quantification of carotid stenosis is based on velocity parameters that correlate the residual internal carotid diameter with NASCET-based stenosis levels, using the diameter of the distal internal carotid lumen as the denominator for stenosis measurement. The following velocity measurements were obtained: RIGHT ICA: 160/23 cm/sec CCA: [id number] cm/sec SYSTOLIC ICA/CCA RATIO:  2.7 ECA:  211 cm/sec LEFT ICA: 149/24 cm/sec CCA: [id number] cm/sec SYSTOLIC ICA/CCA RATIO:  1.9 ECA:  158 cm/sec RIGHT CAROTID ARTERY: Mild  heterogeneous atherosclerotic plaque in the proximal internal carotid artery. By peak systolic velocity criteria, there is less than 50% stenosis in the region of atherosclerotic plaque.  The remainder of the internal carotid artery is tortuous resulting in spuriously elevated velocities. RIGHT VERTEBRAL ARTERY: Early systolic deceleration consistent with incomplete steal. LEFT CAROTID ARTERY: Trace heterogeneous atherosclerotic plaque in the proximal internal carotid artery. By peak systolic velocity criteria, the estimated stenosis remains less than 50%. LEFT VERTEBRAL ARTERY:  Patent with normal antegrade flow. IMPRESSION: 1. Mild (1-49%) stenosis proximal right internal carotid artery secondary to heterogenous atherosclerotic plaque. 2. Mild (1-49%) stenosis proximal left internal carotid artery secondary to heterogenous atherosclerotic plaque. 3. 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 4. Normal antegrade flow in the left vertebral artery. Signed, Criselda Peaches, MD, [facility] Electronically Signed   By: Jacqulynn Cadet M.D.   On: 06/23/2019 09:06   Dg Humerus Left  Result Date: 06/22/2019 CLINICAL DATA:  Fall. EXAM: LEFT HUMERUS - 2+ VIEW COMPARISON:  None. FINDINGS: There is no evidence of fracture or other focal bone lesions. Soft tissues are unremarkable. IMPRESSION: Negative. Electronically Signed   By: Marijo Conception M.D.   On: 06/22/2019 15:11    Assessment: 85 y.o. female with a history of dementia, HTN and HLD who presents after facility staff noted she was not moving her left arm well.  MRI of the brain reviewed and shows an acute large posterior right MCA infarct.  Etiology likely embolic.  Patient on ASA prior to admission.  Carotid dopplers show no evidence of hemodynamically significant stenosis.  Echocardiogram shows no cardiac  source of emboli with an EF of 60-65%.  A1c 4.9, LDL 111.  Stroke Risk Factors - hyperlipidemia and hypertension  Plan: 1. PT consult, OT consult, Speech consult 2. Statin for lipid management with target LDL<70. 3. Prophylactic therapy-Dual antiplatelet therapy with ASA 81mg  and Plavix 75mg  for three weeks with change to Plavix 75mg  daily alone as monotherapy after that time. 4. NPO until RN stroke swallow screen 5. Telemetry monitoring 6. Frequent neuro checks    Alexis Goodell, MD Neurology [phone] 06/23/2019, 2:20 PM

## 2019-06-23 NOTE — Progress Notes (Signed)
[facility] [place] NAME: Kerri Rangel    MR#:  [id number]  DATE OF BIRTH:  May 23, 1924  SUBJECTIVE:  CHIEF COMPLAINT:   Chief Complaint  Patient presents with   Arm Problem   No new complaint this morning.  Resting comfortably.  No fevers.  REVIEW OF SYSTEMS:  ROS unobtainable due to underlying dementia  DRUG ALLERGIES:  No Known Allergies VITALS:  Blood pressure (!) 119/43, pulse 60, temperature 97.9 F (36.6 C), temperature source Oral, resp. rate 18, height 5\' 5"  (1.651 m), weight 68 kg, SpO2 100 %. PHYSICAL EXAMINATION:  Physical Exam  GENERAL:  85 y.o.-year-old patient lying in the bed with no acute distress.  EYES: Pupils equal, round, reactive to light and accommodation. No scleral icterus. Extraocular muscles intact.  HEENT: Head atraumatic, normocephalic. Oropharynx and nasopharynx clear. No oropharyngeal erythema, moist oral mucosa  NECK:  Supple, no jugular venous distention. No thyroid enlargement, no tenderness.  LUNGS: Normal breath sounds bilaterally, no wheezing, rales, rhonchi. No use of accessory muscles of respiration.  CARDIOVASCULAR: S1, S2 RRR. No murmurs, rubs, gallops, clicks.  ABDOMEN: Soft, nontender, nondistended. Bowel sounds present. No organomegaly or mass.  EXTREMITIES: No pedal edema, cyanosis, or clubbing. + 2 pedal & radial pulses b/l.   NEUROLOGIC: Left upper extremity weakness with strength of 1/5 and strength of 3/5 on left lower extremity.  Right side is 4-5 over 5.  Gait not checked. PSYCHIATRIC: The patient is alert and oriented x 1.  SKIN: No obvious rash, lesion, or ulcer.  LABORATORY PANEL:  Female CBC Recent Labs  Lab 06/23/19 0452  WBC 7.8  HGB 11.3*  HCT 35.3*  PLT 190   ------------------------------------------------------------------------------------------------------------------ Chemistries  Recent Labs  Lab 06/23/19 0452  NA 143  K 3.5  CL 114*  CO2 23  GLUCOSE  92  BUN 19  CREATININE 0.65  CALCIUM 10.1   RADIOLOGY:  Dg Chest 1 View  Result Date: 06/22/2019 CLINICAL DATA:  Fall. EXAM: CHEST  1 VIEW COMPARISON:  September 29, 2017. FINDINGS: Stable cardiomediastinal silhouette. Atherosclerosis of thoracic aorta is noted. No pneumothorax or pleural effusion is noted. Bony thorax is unremarkable. No acute pulmonary disease is noted. IMPRESSION: No active disease. Aortic Atherosclerosis (ICD10-I70.0). Electronically Signed   By: Marijo Conception M.D.   On: 06/22/2019 15:04   Dg Forearm Left  Result Date: 06/22/2019 CLINICAL DATA:  Fall. EXAM: LEFT FOREARM - 2 VIEW COMPARISON:  None. FINDINGS: There is no evidence of fracture or other focal bone lesions. Soft tissues are unremarkable. IMPRESSION: Negative. Electronically Signed   By: Marijo Conception M.D.   On: 06/22/2019 15:08   Dg Wrist Complete Left  Result Date: 06/22/2019 CLINICAL DATA:  Fall. EXAM: LEFT WRIST - COMPLETE 3+ VIEW COMPARISON:  None. FINDINGS: No definite fracture or dislocation is noted. There appears to be extensive degenerative change seen involving the radiocarpal and ulnar carpal joints, with subchondral cyst formation. Degenerative changes are seen involving several intercarpal joints as well as the first carpometacarpal joint. Soft tissues are unremarkable. IMPRESSION: Extensive degenerative changes as described above. No definite acute abnormality seen in the left wrist. Electronically Signed   By: Marijo Conception M.D.   On: 06/22/2019 15:10   Ct Head Wo Contrast  Result Date: 06/22/2019 CLINICAL DATA:  Minor head trauma. EXAM: CT HEAD WITHOUT CONTRAST CT CERVICAL SPINE WITHOUT CONTRAST TECHNIQUE: Multidetector CT imaging of the head and cervical spine was performed following the  standard protocol without intravenous contrast. Multiplanar CT image reconstructions of the cervical spine were also generated. COMPARISON:  Head CT 06/09/2018 and cervical spine CT 05/26/2008 FINDINGS: CT  HEAD FINDINGS Brain: Mild prominence of the ventricles, cisterns and other CSF spaces compatible with atrophy. Moderate chronic ischemic microvascular disease is present. Small old lacunar infarct over the left basal ganglia. Small old infarct over the right occipital region. Hazy region of low-attenuation over the gray-white matter of the posterior right parietal region measuring approximately 4.4 x 5.2 cm likely acute to subacute infarct. There is minimal local mass effect. No evidence of midline shift. No evidence of focal mass or acute hemorrhage. Bilateral basal ganglia calcifications are present. Vascular: No hyperdense vessel or unexpected calcification. Skull: Normal. Negative for fracture or focal lesion. Sinuses/Orbits: No acute finding. Other: None. CT CERVICAL SPINE FINDINGS Alignment: Normal. Skull base and vertebrae: Vertebral body heights are normal. There is mild spondylosis throughout the cervical spine. Atlantoaxial articulation is notable for degenerative changes. There is uncovertebral joint spurring and facet arthropathy. No acute fracture or subluxation. Right-sided neural foraminal narrowing at the C4-5 and C6-7 levels with moderate right-sided neural foraminal narrowing at the C5-6 level. Soft tissues and spinal canal: No prevertebral fluid or swelling. No visible canal hematoma. Disc levels:  Minimal disc space narrowing at the C5-6 level. Upper chest: Negative. Other: Mild esophageal wall thickening over the upper esophagus which may be due to reflux esophagitis. IMPRESSION: 1. Hazy attenuation over the gray-white matter of the right posterior parietal region likely acute to subacute watershed infarction. Minimal associated local mass effect. No midline shift or acute hemorrhage. 2. Age related atrophic change with small vessel chronic ischemic disease and small old infarcts as described. 3.  No acute cervical spine injury. 4. Mild spondylosis throughout the cervical spine with disc disease  at the C5-6 level. Multilevel bilateral neural foraminal narrowing worse at the C5-6 level. 5. Circumferential wall thickening of the upper esophagus likely due to reflux esophagitis. Recommend clinical correlation. Electronically Signed   By: Marin Olp M.D.   On: 06/22/2019 14:38   Ct Cervical Spine Wo Contrast  Result Date: 06/22/2019 CLINICAL DATA:  Minor head trauma. EXAM: CT HEAD WITHOUT CONTRAST CT CERVICAL SPINE WITHOUT CONTRAST TECHNIQUE: Multidetector CT imaging of the head and cervical spine was performed following the standard protocol without intravenous contrast. Multiplanar CT image reconstructions of the cervical spine were also generated. COMPARISON:  Head CT 06/09/2018 and cervical spine CT 05/26/2008 FINDINGS: CT HEAD FINDINGS Brain: Mild prominence of the ventricles, cisterns and other CSF spaces compatible with atrophy. Moderate chronic ischemic microvascular disease is present. Small old lacunar infarct over the left basal ganglia. Small old infarct over the right occipital region. Hazy region of low-attenuation over the gray-white matter of the posterior right parietal region measuring approximately 4.4 x 5.2 cm likely acute to subacute infarct. There is minimal local mass effect. No evidence of midline shift. No evidence of focal mass or acute hemorrhage. Bilateral basal ganglia calcifications are present. Vascular: No hyperdense vessel or unexpected calcification. Skull: Normal. Negative for fracture or focal lesion. Sinuses/Orbits: No acute finding. Other: None. CT CERVICAL SPINE FINDINGS Alignment: Normal. Skull base and vertebrae: Vertebral body heights are normal. There is mild spondylosis throughout the cervical spine. Atlantoaxial articulation is notable for degenerative changes. There is uncovertebral joint spurring and facet arthropathy. No acute fracture or subluxation. Right-sided neural foraminal narrowing at the C4-5 and C6-7 levels with moderate right-sided neural  foraminal narrowing at the C5-6  level. Soft tissues and spinal canal: No prevertebral fluid or swelling. No visible canal hematoma. Disc levels:  Minimal disc space narrowing at the C5-6 level. Upper chest: Negative. Other: Mild esophageal wall thickening over the upper esophagus which may be due to reflux esophagitis. IMPRESSION: 1. Hazy attenuation over the gray-white matter of the right posterior parietal region likely acute to subacute watershed infarction. Minimal associated local mass effect. No midline shift or acute hemorrhage. 2. Age related atrophic change with small vessel chronic ischemic disease and small old infarcts as described. 3.  No acute cervical spine injury. 4. Mild spondylosis throughout the cervical spine with disc disease at the C5-6 level. Multilevel bilateral neural foraminal narrowing worse at the C5-6 level. 5. Circumferential wall thickening of the upper esophagus likely due to reflux esophagitis. Recommend clinical correlation. Electronically Signed   By: Marin Olp M.D.   On: 06/22/2019 14:38   Mr Brain Wo Contrast  Result Date: 06/22/2019 CLINICAL DATA:  Left upper extremity weakness EXAM: MRI HEAD WITHOUT CONTRAST TECHNIQUE: Multiplanar, multiecho pulse sequences of the brain and surrounding structures were obtained without intravenous contrast. COMPARISON:  Head CT 06/22/2019 and brain MRI 05/27/2008 FINDINGS: BRAIN: There is a large acute infarct of the posterior right MCA territory. No hemorrhage or mass effect. No other area of ischemia. There is an old right occipital infarct. Diffuse confluent white matter hyperintensity, most often a result of chronic microvascular ischemia. There is generalized atrophy without lobar predilection. There is an old small vessel infarct of the left deep gray nuclei. VASCULAR: The major intracranial arterial and venous sinus flow voids are normal. Susceptibility-sensitive sequences show no chronic microhemorrhage or superficial siderosis.  SKULL AND UPPER CERVICAL SPINE: Calvarial bone marrow signal is normal. There is no skull base mass. The visualized upper cervical spine and soft tissues are normal. SINUSES/ORBITS: There are no fluid levels or advanced mucosal thickening. The mastoid air cells and middle ear cavities are free of fluid. The orbits are normal. IMPRESSION: 1. Large acute posterior right MCA territory infarct without hemorrhage or mass effect. 2. Old right occipital infarct and severe chronic small vessel ischemia. Electronically Signed   By: Ulyses Jarred M.D.   On: 06/22/2019 23:21   Dg Pelvis Portable  Result Date: 06/22/2019 CLINICAL DATA:  Fall. EXAM: PORTABLE PELVIS 1-2 VIEWS COMPARISON:  September 26, 2017. FINDINGS: Status post surgical internal fixation of proximal left femoral intertrochanteric fracture. Right hip is unremarkable. No acute fracture or dislocation is noted. IMPRESSION: Postsurgical changes as described above.  No acute abnormality seen. Electronically Signed   By: Marijo Conception M.D.   On: 06/22/2019 15:06   Dg Hand 2 View Left  Result Date: 06/22/2019 CLINICAL DATA:  Fall. EXAM: LEFT HAND - 2 VIEW COMPARISON:  None. FINDINGS: There is no evidence of fracture or dislocation. Extensive degenerative changes are seen involving the radiocarpal, intercarpal and first carpometacarpal joints. Soft tissues are unremarkable. IMPRESSION: Extensive degenerative changes as described above. No acute abnormality seen in the left hand. Electronically Signed   By: Marijo Conception M.D.   On: 06/22/2019 15:12   US Carotid Bilateral  Result Date: 06/23/2019 CLINICAL DATA:  85 year old female with new large right posterior MCA territory infarct EXAM: BILATERAL CAROTID DUPLEX ULTRASOUND TECHNIQUE: Pearline Cables scale imaging, color Doppler and duplex ultrasound were performed of bilateral carotid and vertebral arteries in the neck. COMPARISON:  Prior carotid duplex ultrasound 05/27/2008 FINDINGS: Criteria: Quantification of  carotid stenosis is based on velocity parameters that correlate the  residual internal carotid diameter with NASCET-based stenosis levels, using the diameter of the distal internal carotid lumen as the denominator for stenosis measurement. The following velocity measurements were obtained: RIGHT ICA: 160/23 cm/sec CCA: [id number] cm/sec SYSTOLIC ICA/CCA RATIO:  2.7 ECA:  211 cm/sec LEFT ICA: 149/24 cm/sec CCA: [id number] cm/sec SYSTOLIC ICA/CCA RATIO:  1.9 ECA:  158 cm/sec RIGHT CAROTID ARTERY: Mild heterogeneous atherosclerotic plaque in the proximal internal carotid artery. By peak systolic velocity criteria, there is less than 50% stenosis in the region of atherosclerotic plaque. The remainder of the internal carotid artery is tortuous resulting in spuriously elevated velocities. RIGHT VERTEBRAL ARTERY: Early systolic deceleration consistent with incomplete steal. LEFT CAROTID ARTERY: Trace heterogeneous atherosclerotic plaque in the proximal internal carotid artery. By peak systolic velocity criteria, the estimated stenosis remains less than 50%. LEFT VERTEBRAL ARTERY:  Patent with normal antegrade flow. IMPRESSION: 1. Mild (1-49%) stenosis proximal right internal carotid artery secondary to heterogenous atherosclerotic plaque. 2. Mild (1-49%) stenosis proximal left internal carotid artery secondary to heterogenous atherosclerotic plaque. 3. 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 4. Normal antegrade flow in the left vertebral artery. Signed, Criselda Peaches, MD, [facility] Electronically Signed   By: Jacqulynn Cadet M.D.   On: 06/23/2019 09:06   Dg Humerus Left  Result Date: 06/22/2019 CLINICAL DATA:  Fall. EXAM: LEFT HUMERUS - 2+ VIEW COMPARISON:  None. FINDINGS: There is no evidence of fracture or other focal bone lesions. Soft tissues are unremarkable.  IMPRESSION: Negative. Electronically Signed   By: Marijo Conception M.D.   On: 06/22/2019 15:11   ASSESSMENT AND PLAN:   85 y.o. female with a known history of colon cancer, anxiety, depression, history of dementia, hypertension hyperlipidemia, hypothyroidism who presents to the hospital due to left upper extremity weakness.  1.  CVA/TIA-patient presented to the hospital with left upper extremity weakness.  Patient CT head suggestive of a right parietal subacute CVA.   MRI of the brain reviewed large acute posterior right MCA territory infarct without hemorrhage or mass effect.  Carotid Doppler ultrasound did not reveal any hemodynamically significant stenosis but revealed incomplete steal in the right vertebral artery suggests a stenosis in the more proximal subclavian or brachiocephalic arteries. Follow-up on echo report Follow-up on neurology evaluation Continue aspirin and statins Follow-up on physical therapy and speech therapy evaluation and recommendations. Patient's daughter stated patient has been mostly bedbound since prior hip fracture last year.  Will likely return back to nursing home on discharge.  2.  Essential hypertension-continue atenolol.  3.  Hypothyroidism-continue Synthroid.   4.  GERD-continue Protonix.  5.  Depression-continue Zoloft.  6.  Dementia-continue Aricept.  DVT prophylaxis; Lovenox   All the records are reviewed and case discussed with Care Management/Social Worker. Called patient's daughter listed in chart; Ms. [name] Changed from observation to inpatient status.  CODE STATUS: DNR  TOTAL TIME TAKING CARE OF THIS PATIENT: 34 minutes.   More than 50% of the time was spent in counseling/coordination of care: YES  POSSIBLE D/C IN 2 DAYS, DEPENDING ON CLINICAL CONDITION.   Elza Sortor M.D on 06/23/2019 at 1:06 PM  Between 7am to 6pm - Pager - [phone]  After 6pm go to [email or web address] - Proofreader  [facility] [place]  [facility]  Office  [phone]  CC: Primary care physician; [facility]  Note: This dictation was prepared with Dragon dictation along  with smaller phrase technology. Any transcriptional errors that result from this process are unintentional.

## 2019-06-24 ENCOUNTER — Inpatient Hospital Stay: Payer: PPO

## 2019-06-24 ENCOUNTER — Other Ambulatory Visit: Payer: Self-pay

## 2019-06-24 LAB — MAGNESIUM: Magnesium: 1.8 mg/dL (ref 1.7–2.4)

## 2019-06-24 LAB — BASIC METABOLIC PANEL
Anion gap: 8 (ref 5–15)
BUN: 18 mg/dL (ref 8–23)
CO2: 23 mmol/L (ref 22–32)
Calcium: 10.1 mg/dL (ref 8.9–10.3)
Chloride: 114 mmol/L — ABNORMAL HIGH (ref 98–111)
Creatinine, Ser: 0.76 mg/dL (ref 0.44–1.00)
GFR calc Af Amer: >60 mL/min (ref >60)
GFR calc non Af Amer: >60 mL/min (ref >60)
Glucose, Bld: 110 mg/dL — ABNORMAL HIGH (ref 70–99)
Potassium: 3.1 mmol/L — ABNORMAL LOW (ref 3.5–5.1)
Sodium: 145 mmol/L (ref 135–145)

## 2019-06-24 MED ORDER — ENSURE ENLIVE PO LIQD
237.0000 mL | ORAL | Status: DC
  Administered 2019-06-24 – 2019-07-08 (×10): 237 mL via ORAL

## 2019-06-24 MED ORDER — CLOPIDOGREL BISULFATE 75 MG PO TABS
75.0000 mg | ORAL_TABLET | Freq: Every day | ORAL | Status: DC
  Administered 2019-06-24 – 2019-07-02 (×9): 75 mg via ORAL
  Filled 2019-06-24 (×9): qty 1

## 2019-06-24 MED ORDER — MAGNESIUM SULFATE IN D5W 1-5 GM/100ML-% IV SOLN
1.0000 g | Freq: Once | INTRAVENOUS | Status: AC
  Administered 2019-06-24: 13:00:00 1 g via INTRAVENOUS
  Filled 2019-06-24: qty 100

## 2019-06-24 MED ORDER — POTASSIUM CHLORIDE 20 MEQ PO PACK
40.0000 meq | PACK | Freq: Once | ORAL | Status: AC
  Administered 2019-06-24: 40 meq via ORAL
  Filled 2019-06-24: qty 2

## 2019-06-24 NOTE — Progress Notes (Signed)
Speech Language Pathology Treatment: Dysphagia  Patient Details Name: Kerri Rangel MRN: [id number] DOB: April 13, 1924 Today's Date: 06/24/2019 Time: [id number] SLP Time Calculation (min) (ACUTE ONLY): 47 min  Assessment / Plan / Recommendation Clinical Impression  Pt seen today for ongoing assessment of toleration of modified diet recommended; safety w/ foods/liquids. NSG reported pt ate full meals last night and this morning for breakfast -- completed most/sll the items on each tray especially enjoying the meat/potatoes at last night's dinner meal. Pt continues to require assistance w/ feeding but can hold the Cup to Drink liquids herself. Pt appears to present w/ grossly adequate oropharyngeal phase swallow function w/ adequate bolus management and swallowing -- No immediate, overt clinical s/s of aspiration during the food/liquid trials consumed during this Lunch meal today. Pt appeared min fatigued, maybe getting full as meal continued, but she was readily engaged and eager to [place]. She was more verbal w/ SLP - time given for her to answer questions. She followed Basic instructions w/ guidance from SLP during self-feeding tasks - she Held Cup to Drink liquids during the meal w/ encouragement(this increases her Safety w/ Drinking). Pt does have a Baseline of Dementia and in light of a new CVA(R MCA) and need for feeding support at meals, she is at increased risk for aspiration.  During the food/liquids at the meal, No overt s/s of aspiration - no coughing or decline in vocal quality, respiratory effort. During the oral phase, pt exhibited good bolus control and management; timely A-P transfer and oral clearing w/ all trials(purees, thin liquids). Her Dentures were secured w/ adhesive for the meal. Feeding Support necessary but pt was able to Hold Cup to drink herself(increases her safety). Recommend continue a Pureed diet w/ Thin liquids via Cup now and at Discharge for easiest oral intake;  aspiration precautions; Pills in Puree for safer swallowing; Support at all meals. Must make sure Dentures are secure in mouth w/ adhesive. Recommend education w/ Family, staff on new diet needs; support at meals for discharge. No further skilled ST services indicated but ST will be available while admitted for questions, education. NSG updated, agreed.     HPI HPI: Pt is an 85 y.o. female with a history of Dementia, HTN and HLD who presents with left sided weakness.  Patient unable to provide any history therefore all history obtained from the chart.  Patient had a fall a few days back and had injury to her left arm.  Her left wrist was somewhat swollen but the staff at the assisted living noticed that she was not moving her left arm very well.  Patient was brought in for evaluation.  MRI: "Large acute posterior right MCA territory infarct without, generalized atrophy".  CXR: no acute disease.       SLP Plan  Continue with current plan of care       Recommendations  Diet recommendations: Dysphagia 1 (puree);Thin liquid Liquids provided via: Cup Medication Administration: Crushed with puree(for safer swallowing) Supervision: Staff to assist with self feeding;Intermittent supervision to cue for compensatory strategies Compensations: Minimize environmental distractions;Slow rate;Small sips/bites;Lingual sweep for clearance of pocketing;Multiple dry swallows after each bite/sip;Follow solids with liquid Postural Changes and/or Swallow Maneuvers: Seated upright 90 degrees;Upright 30-60 min after meal                General recommendations: (Dietician f/u) Oral Care Recommendations: Oral care BID;Staff/trained caregiver to provide oral care Follow up Recommendations: None SLP Visit Diagnosis: Dysphagia, unspecified (R13.10) Plan: Continue with current  plan of care       [place], [place], CCC-SLP Kerri Rangel 06/24/2019, 1:39 PM

## 2019-06-24 NOTE — Progress Notes (Signed)
[place] is password

## 2019-06-24 NOTE — NC FL2 (Signed)
[place] LEVEL OF CARE SCREENING TOOL     IDENTIFICATION  Patient Name: Kerri Rangel Birthdate: 11-29-1923 Sex: female Admission Date (Current Location): 06/22/2019  [place] and [place] Number:  Engineering geologist and Address:  [facility], [address] [place], [place] [address]      Provider Number: [id number]  Attending Physician Name and Address:  Otila Back, MD  Relative Name and Phone Number:  Lavonna Rua [phone]    Current Level of Care: Hospital Recommended Level of Care: [place] Prior Approval Number:    Date Approved/Denied:   PASRR Number: [id number]  Discharge Plan: SNF    Current Diagnoses: Patient Active Problem List   Diagnosis Date Noted  . Stroke ([place]) 06/23/2019  . CVA (cerebral vascular accident) ([place]) 06/22/2019  . Pressure ulcer, heel 11/08/2017  . Closed left hip fracture ([place]) 09/25/2017  . Carotid artery disease ([place]) 11/06/2016  . Anxiety 08/07/2016  . Depression 08/07/2016  . Hyperlipidemia, unspecified 08/07/2016  . Hypertension 08/07/2016  . Hypothyroidism 08/07/2016  . Vitamin D deficiency   . Dementia ([place])   . Common bile duct (CBD) obstruction 08/01/2016  . Gallstone 08/01/2016  . Late onset Alzheimer's disease with behavioral disturbance ([place]) 03/02/2015  . Dizzy 03/02/2014  . Edema 03/02/2014    Orientation RESPIRATION BLADDER Height & Weight     Self  Normal Incontinent Weight: 68 kg Height:  5\' 5"  (165.1 cm)  BEHAVIORAL SYMPTOMS/MOOD NEUROLOGICAL BOWEL NUTRITION STATUS      Incontinent Diet(Dysphagia 1)  AMBULATORY STATUS COMMUNICATION OF NEEDS Skin   Extensive Assist Verbally Skin abrasions(skin tears and MASD)                       Personal Care Assistance Level of Assistance  Bathing, Feeding, Dressing Bathing Assistance: Maximum assistance Feeding assistance: Limited assistance Dressing Assistance: Maximum assistance      Functional Limitations Info             SPECIAL CARE FACTORS FREQUENCY  PT (By licensed PT), OT (By licensed OT)     PT Frequency: 5 times per week OT Frequency: 5 times per week            Contractures Contractures Info: Not present    Additional Factors Info  Code Status, Allergies Code Status Info: DNR Allergies Info: No known allergies           Current Medications (06/24/2019):  This is the current hospital active medication list Current Facility-Administered Medications  Medication Dose Route Frequency Provider Last Rate Last Dose  . 0.9 %  sodium chloride infusion   Intravenous Continuous Lang Snow, NP 75 mL/hr at 06/24/19 0417    . acetaminophen (TYLENOL) tablet 650 mg  650 mg Oral Q6H PRN Henreitta Leber, MD       Or  . acetaminophen (TYLENOL) suppository 650 mg  650 mg Rectal Q6H PRN Henreitta Leber, MD      . alum & mag hydroxide-simeth (MAALOX/MYLANTA) 200-200-20 MG/5ML suspension 30 mL  30 mL Oral Q6H PRN Henreitta Leber, MD      . aspirin EC tablet 81 mg  81 mg Oral Daily Henreitta Leber, MD   81 mg at 06/23/19 1052  . atenolol (TENORMIN) tablet 50 mg  50 mg Oral Daily Sainani, Belia Heman, MD      . atorvastatin (LIPITOR) tablet 40 mg  40 mg Oral q1800 Stark Jock Jude, MD   40  mg at 06/23/19 1756  . busPIRone (BUSPAR) tablet 15 mg  15 mg Oral BID Henreitta Leber, MD   15 mg at 06/23/19 2151  . cefTRIAXone (ROCEPHIN) 1 g in sodium chloride 0.9 % 100 mL IVPB  1 g Intravenous Q24H Stark Jock, Jude, MD   Stopped at 06/23/19 1402  . clopidogrel (PLAVIX) tablet 75 mg  75 mg Oral Daily Ojie, Jude, MD      . docusate sodium (COLACE) capsule 100 mg  100 mg Oral BID Henreitta Leber, MD   100 mg at 06/23/19 2152  . donepezil (ARICEPT) tablet 5 mg  5 mg Oral q morning - 10a Sainani, Belia Heman, MD   5 mg at 06/23/19 1052  . enoxaparin (LOVENOX) injection 40 mg  40 mg Subcutaneous Q24H Henreitta Leber, MD   40 mg at 06/23/19 2152  . [START ON 06/25/2019] fentaNYL  (DURAGESIC) 12 MCG/HR 1 patch  1 patch Transdermal Q72H Sainani, Vivek J, MD      . ferrous sulfate tablet 325 mg  325 mg Oral Q breakfast Henreitta Leber, MD   325 mg at 06/23/19 1052  . levothyroxine (SYNTHROID) tablet 50 mcg  50 mcg Oral QAC breakfast Henreitta Leber, MD   50 mcg at 06/24/19 [id number]  . loperamide (IMODIUM) capsule 2 mg  2 mg Oral PRN Henreitta Leber, MD      . LORazepam (ATIVAN) tablet 0.5 mg  0.5 mg Oral Daily PRN Henreitta Leber, MD      . magnesium hydroxide (MILK OF MAGNESIA) suspension 30 mL  30 mL Oral QHS PRN Henreitta Leber, MD      . mirtazapine (REMERON) tablet 30 mg  30 mg Oral QHS Henreitta Leber, MD   30 mg at 06/23/19 2150  . ondansetron (ZOFRAN) tablet 4 mg  4 mg Oral Q6H PRN Henreitta Leber, MD       Or  . ondansetron (ZOFRAN) injection 4 mg  4 mg Intravenous Q6H PRN Henreitta Leber, MD      . pantoprazole (PROTONIX) EC tablet 40 mg  40 mg Oral Daily Henreitta Leber, MD   40 mg at 06/23/19 1053  . potassium chloride (KLOR-CON) packet 40 mEq  40 mEq Oral Once Ojie, Jude, MD      . saccharomyces boulardii (FLORASTOR) capsule 250 mg  250 mg Oral BID Henreitta Leber, MD   250 mg at 06/23/19 2151  . sertraline (ZOLOFT) tablet 25 mg  25 mg Oral Daily Henreitta Leber, MD   25 mg at 06/23/19 1053  . vitamin B-12 (CYANOCOBALAMIN) tablet 1,000 mcg  1,000 mcg Oral Daily Henreitta Leber, MD   1,000 mcg at 06/23/19 1052     Discharge Medications: Please see discharge summary for a list of discharge medications.  Relevant Imaging Results:  Relevant Lab Results:   Additional Information SS# [id number]  Shelbie Hutching, RN

## 2019-06-24 NOTE — Progress Notes (Signed)
Physical Therapy Treatment Patient Details Name: Kerri Rangel MRN: [id number] DOB: 1924-02-20 Today's Date: 06/24/2019    History of Present Illness pt is a 85 yo female admitted for stroke work up after presenting to ED from [facility] with left arm weakness, head CT showed possible sided parietal infarct. PMH: colon cancer, anxiety, depression, dementia, HTN, HLD, hypothyroidism    PT Comments    Co treat with OT. Pt initially soundly sleeping, but awoken through voice/touch. Pt agreeable to PT. Follow instruction well. Pt notes significant pain in LUE at wrist and hand with bruising and swelling. Earlier imaging negative, but both therapists concerned regarding appearance, pain, warmth and lack of movement. OT discussed with nursing post session; appreciate repeat imaging. Possible sling for protection. Pt requires Mod A for supine to sit and Max A sit to supine. Sitting posture with Min A statically as well as strong hold initially on therapist due to verbalized fear of falling and some trembling. Ultimate hand over hand placement of pt hand to bed rail and increased assurance that therapists would not allow pt to fall. Mod/Max for dynamic LE movement with heavy posterior lean. Pt tolerated well, but fatigues quickly wishing return to bed and done so with Max A. LUE placed on pillow with hand roll for improved positioning and protection. Continue PT to progress endurance and strength to improve functional mobility. Follow up on imaging reports regarding LUE.  Follow Up Recommendations  SNF     Equipment Recommendations       Recommendations for Other Services       Precautions / Restrictions Precautions Precautions: Fall Restrictions Weight Bearing Restrictions: No    Mobility  Bed Mobility Overal bed mobility: Needs Assistance Bed Mobility: Supine to Sit;Sit to Supine     Supine to sit: Mod assist;Max assist Sit to supine: Max assist   General bed mobility comments: Pt  demonstrates good effort in mobilizing LE toward edge of bed. Mod for trunk. Requires Max to square hips to edge of bed. Min to Max to maintain trunk upright; static Min and dynamic Max  Transfers                 General transfer comment: Not tested; pt fatigues in sit quickly  Ambulation/Gait                 Stairs             Wheelchair Mobility    Modified Rankin (Stroke Patients Only)       Balance Overall balance assessment: Needs assistance;History of Falls Sitting-balance support: Feet supported;Single extremity supported Sitting balance-Leahy Scale: Poor Sitting balance - Comments: Holds therapist tightly with R hand in sit for fear of falling. Min for static sit; Max for dynamic                                    Cognition Arousal/Alertness: Awake/alert(initially sleeping; awoke with voice/touch) Behavior During Therapy: WFL for tasks assessed/performed Overall Cognitive Status: History of cognitive impairments - at baseline                                        Exercises General Exercises - Lower Extremity Ankle Circles/Pumps: AROM;Both;Seated(smaller range on L) Long Arc Quad: AROM;Both;5 reps;Seated Other Exercises Other Exercises: sitting tolerance    General Comments  General comments (skin integrity, edema, etc.): Bruising BLEs R > L, but increased swelling LLE. Significant swelling LUE with bruising. Very focal swelling on posterior aspect L wrist and hand      Pertinent Vitals/Pain Pain Assessment: Faces Pain Score: 2  Faces Pain Scale: Hurts whole lot Pain Location: LUE; wrist/hand Pain Descriptors / Indicators: Aching;Constant;Grimacing;Guarding;Moaning Pain Intervention(s): Limited activity within patient's tolerance;Monitored during session;Other (comment)(positioned with pillow/hand roll; request imaging)    Home Living                      Prior Function            PT Goals  (current goals can now be found in the care plan section) Progress towards PT goals: Progressing toward goals(slowly)    Frequency    7X/week      PT Plan Current plan remains appropriate    Co-evaluation              AM-PAC PT "6 Clicks" Mobility   Outcome Measure  Help needed turning from your back to your side while in a flat bed without using bedrails?: A Lot Help needed moving from lying on your back to sitting on the side of a flat bed without using bedrails?: A Lot Help needed moving to and from a bed to a chair (including a wheelchair)?: Total Help needed standing up from a chair using your arms (e.g., wheelchair or bedside chair)?: Total Help needed to walk in hospital room?: Total Help needed climbing 3-5 steps with a railing? : Total 6 Click Score: 8    End of Session   Activity Tolerance: Patient limited by fatigue;Patient limited by pain Patient left: in bed;with call bell/phone within reach;with bed alarm set Nurse Communication: Other (comment)(OT communicated regarding LUE and re imaging) PT Visit Diagnosis: Muscle weakness (generalized) (M62.81);Difficulty in walking, not elsewhere classified (R26.2);Pain Pain - Right/Left: Left Pain - part of body: Leg;Arm;Hand     Time: [id number] PT Time Calculation (min) (ACUTE ONLY): 23 min  Charges:  $Therapeutic Activity: 8-22 mins(co treat with OT)                      Larae Grooms, PTA 06/24/2019, 2:49 PM

## 2019-06-24 NOTE — Evaluation (Signed)
Occupational Therapy Evaluation Patient Details Name: Kerri Rangel MRN: [id number] DOB: February 22, 1924 Today's Date: 06/24/2019    History of Present Illness pt is a 85 yo female admitted for stroke work up after presenting to ED from [place] with left arm weakness, head CT showed possible sided parietal infarct. PMH: colon cancer, anxiety, depression, dementia, HTN, HLD, hypothyroidism   Clinical Impression   [name] was seen for OT/PT co-treat/evaluation this date. Pt received supine in bed, asleep upon therapist entry, but wakes to verbal/tactile cueing. Pt smiles broadly at therapists and agreeable to OT evaluation this date. Pt noted to be limited historian 2/2 cognitive impairments at baseline Per chart, pt is a resident at [facility], and typically uses a WC for mobility. Currently pt demonstrates impairments in strength, mobility, and functional UE use with significant swelling and bruising noted in her LUE requiring, particularly at the wrist/hand on the dorsal surface. Pt notes significant pain in LUE at wrist with even gentle PROM/movement. Earlier imaging negative, but both therapists concerned regarding appearance, pain, warmth and lack of movement. This Chief Strategy Officer discussed concerns with with nursing and notified MD post session. Appreciate repeat imaging/guidelines for WB status through LUE.  Pt would benefit from skilled OT to address noted impairments and functional limitations (see below for any additional details) in order to maximize safety and independence while minimizing falls risk and caregiver burden.  Upon hospital discharge, recommend pt discharge to [place].     Follow Up Recommendations  SNF    Equipment Recommendations  Other (comment)(TBD at next venue of care)    Recommendations for Other Services       Precautions / Restrictions Precautions Precautions: Fall Restrictions Weight Bearing Restrictions: No      Mobility Bed Mobility Overal bed  mobility: Needs Assistance Bed Mobility: Supine to Sit;Sit to Supine     Supine to sit: Mod assist;Max assist;+2 for physical assistance Sit to supine: Max assist;+2 for physical assistance   General bed mobility comments: Pt demonstrates good effort in mobilizing LE toward edge of bed. Mod for trunk. Requires Max to square hips to edge of bed. Min to Max to maintain trunk upright; static Min and dynamic Max  Transfers                 General transfer comment: Not tested; pt fatigues in sit quickly    Balance Overall balance assessment: Needs assistance;History of Falls Sitting-balance support: Feet supported;Single extremity supported Sitting balance-Leahy Scale: Poor Sitting balance - Comments: Holds therapist tightly with R hand in sit for fear of falling. Min for static sit; Max for dynamic Postural control: Posterior lean                                 ADL either performed or assessed with clinical judgement   ADL                                         General ADL Comments: Pt requires max assist for ADL mgt at bed level 2/2 weakness, cognition, and LUE pain. Pt able to follow simple single step cues and does hold cup with RUE this date to take small sip of water. Pt on modified (pureed diet with thin liquids) per SLP, and requires assistance with self-feeding full meals. Pt required +2 assist for  bed mobility and functional transfers. Endorses significant fear of falling and holds onto therapists tightly with bed mobility.     Vision         Perception     Praxis      Pertinent Vitals/Pain Pain Assessment: Faces Pain Score: 2  Faces Pain Scale: Hurts whole lot Pain Location: LUE; wrist/hand Pain Descriptors / Indicators: Aching;Constant;Grimacing;Guarding;Moaning Pain Intervention(s): Limited activity within patient's tolerance;Monitored during session;Other (comment)(Positioned with pillow and hand roll; request repeat immaging  from MD)     Hand Dominance Left(Per pt.)   Extremity/Trunk Assessment Upper Extremity Assessment Upper Extremity Assessment: LUE deficits/detail;RUE deficits/detail RUE Deficits / Details: Generally weak but WFL. LUE Deficits / Details: unable to fully assess secondary to severe pain, LUE mostly flaccid during eval with significant swelling over L wrist. LUE noted to be hot to the touch as compared to RUE. LUE: Unable to fully assess due to pain LUE Coordination: decreased gross motor;decreased fine motor   Lower Extremity Assessment Lower Extremity Assessment: Generalized weakness;Defer to PT evaluation       Communication Communication Communication: No difficulties   Cognition Arousal/Alertness: Awake/alert(Awakens with cueing at start of session.) Behavior During Therapy: [facility] for tasks assessed/performed Overall Cognitive Status: History of cognitive impairments - at baseline                                     General Comments  Bruising noted on BLEs R>L but increased swelling on LLE. Significant swelling noted with LUE as well as brusing. Very focal swelling over posterior aspect of L wrist and hand with L wrist and hand feeling hot to the touch as compared to RUE. RN/MD notified.    Exercises Exercises: General Lower Extremity;Other exercises General Exercises - Lower Extremity Ankle Circles/Pumps: AROM;Both;Seated(smaller range on L) Long Arc Quad: AROM;Both;5 reps;Seated Other Exercises Other Exercises: Pt and RN educated on LUE positioning to promote safety and skin integrity this date. OT repositions pt LUE on pillow with rolled washcloth to promote neutral positioning.   Shoulder Instructions      Home Living Family/patient expects to be discharged to:: Assisted living(Per chart, Pt resident at [facility])                                        Prior Functioning/Environment Level of Independence: Needs assistance         Comments: pt is poor historian per chart review with noted cognitive impairments at baseline, unclear how much assist she requires at baseline, pt reported independent with dressing and bathing        OT Problem List: Decreased strength;Decreased coordination;Pain;Decreased range of motion;Decreased cognition;Increased edema;Decreased activity tolerance;Decreased safety awareness;Impaired tone;Impaired balance (sitting and/or standing);Decreased knowledge of use of DME or AE;Impaired UE functional use      OT Treatment/Interventions: Self-care/ADL training;Balance training;Therapeutic exercise;Therapeutic activities;Energy conservation;DME and/or AE instruction;Patient/family education    OT Goals(Current goals can be found in the care plan section) Acute Rehab OT Goals Patient Stated Goal: Pt unable to state OT Goal Formulation: Patient unable to participate in goal setting Time For Goal Achievement: 07/08/19 ADL Goals Pt Will Perform Eating: with min assist;sitting;with caregiver independent in assisting(With LRAD PRN for improved safety and functional independence) Pt Will Perform Grooming: sitting;with min assist;with adaptive equipment;with caregiver independent in assisting(With LRAD PRN  for improved safety and functional independence) Pt Will Perform Upper Body Dressing: sitting;with min assist;with caregiver independent in assisting;with adaptive equipment(With LRAD PRN for improved safety and functional independence) Pt Will Perform Lower Body Dressing: with min assist;with adaptive equipment;with caregiver independent in assisting;sitting/lateral leans(With LRAD PRN for improved safety and functional independence)  OT Frequency: Min 2X/week   Barriers to D/C:            Co-evaluation PT/OT/SLP Co-Evaluation/Treatment: Yes Reason for Co-Treatment: For patient/therapist safety;To address functional/ADL transfers;Necessary to address cognition/behavior during functional  activity PT goals addressed during session: Mobility/safety with mobility;Balance;Strengthening/ROM OT goals addressed during session: ADL's and self-care;Strengthening/ROM      AM-PAC OT "6 Clicks" Daily Activity     Outcome Measure Help from another person eating meals?: A Lot Help from another person taking care of personal grooming?: A Lot Help from another person toileting, which includes using toliet, bedpan, or urinal?: Total Help from another person bathing (including washing, rinsing, drying)?: Total Help from another person to put on and taking off regular upper body clothing?: A Lot Help from another person to put on and taking off regular lower body clothing?: A Lot 6 Click Score: 10   End of Session    Activity Tolerance: Patient limited by pain;Patient tolerated treatment well Patient left: in bed;with bed alarm set;with call bell/phone within reach  OT Visit Diagnosis: Other abnormalities of gait and mobility (R26.89);Pain;History of falling (Z91.81);Hemiplegia and hemiparesis Hemiplegia - Right/Left: Left Hemiplegia - dominant/non-dominant: Dominant Hemiplegia - caused by: Cerebral infarction Pain - Right/Left: Left Pain - part of body: Arm;Hand                Time: 1331-1355 OT Time Calculation (min): 24 min Charges:  OT General Charges $OT Visit: 1 Visit OT Evaluation $OT Eval High Complexity: 1 High OT Treatments $Self Care/Home Management : 8-22 mins  Shara Blazing, M.S., OTR/L Ascom: [phone] 06/24/19, 4:08 PM

## 2019-06-24 NOTE — Progress Notes (Signed)
[facility] NAME: Kerri Rangel    MR#:  [id number]  DATE OF BIRTH:  10/11/1923  SUBJECTIVE:  CHIEF COMPLAINT:   Chief Complaint  Patient presents with  . Arm Problem   No new complaint this morning.  Sitting up in bed and being assisted with breakfast.  No fevers.  REVIEW OF SYSTEMS:  ROS unobtainable due to underlying dementia  DRUG ALLERGIES:  No Known Allergies VITALS:  Blood pressure (!) 123/52, pulse 72, temperature 98.9 F (37.2 C), temperature source Oral, resp. rate 16, height 5\' 5"  (1.651 m), weight 68 kg, SpO2 100 %. PHYSICAL EXAMINATION:  Physical Exam  GENERAL:  85 y.o.-year-old patient lying in the bed with no acute distress.  EYES: Pupils equal, round, reactive to light and accommodation. No scleral icterus. Extraocular muscles intact.  HEENT: Head atraumatic, normocephalic. Oropharynx and nasopharynx clear. No oropharyngeal erythema, moist oral mucosa  NECK:  Supple, no jugular venous distention. No thyroid enlargement, no tenderness.  LUNGS: Normal breath sounds bilaterally, no wheezing, rales, rhonchi. No use of accessory muscles of respiration.  CARDIOVASCULAR: S1, S2 RRR. No murmurs, rubs, gallops, clicks.  ABDOMEN: Soft, nontender, nondistended. Bowel sounds present. No organomegaly or mass.  EXTREMITIES: No pedal edema, cyanosis, or clubbing. + 2 pedal & radial pulses b/l.   NEUROLOGIC: Left upper extremity weakness with strength of 1/5 and strength of 3/5 on left lower extremity.  Right side is 4-5 over 5.  Gait not checked. PSYCHIATRIC: The patient is alert and oriented x 1.  SKIN: No obvious rash, lesion, or ulcer.  LABORATORY PANEL:  Female CBC Recent Labs  Lab 06/23/19 0452  WBC 7.8  HGB 11.3*  HCT 35.3*  PLT 190   ------------------------------------------------------------------------------------------------------------------ Chemistries  Recent Labs  Lab 06/24/19 0549  NA 145  K  3.1*  CL 114*  CO2 23  GLUCOSE 110*  BUN 18  CREATININE 0.76  CALCIUM 10.1  MG 1.8   RADIOLOGY:  No results found. ASSESSMENT AND PLAN:   85 y.o. female with a known history of colon cancer, anxiety, depression, history of dementia, hypertension hyperlipidemia, hypothyroidism who presents to the hospital due to left upper extremity weakness.  1.  CVA/TIA-patient presented to the hospital with left upper extremity weakness.  Patient CT head suggestive of a right parietal subacute CVA.   MRI of the brain reviewed large acute posterior right MCA territory infarct without hemorrhage or mass effect.  Carotid Doppler ultrasound did not reveal any hemodynamically significant stenosis but revealed incomplete steal in the right vertebral artery suggests a stenosis in the more proximal subclavian or brachiocephalic arteries. 2D echocardiogram done with ejection fraction of 60 to 65%.  No mention of intracardiac thrombus.   Patient seen by neurologist.  Appreciate input.  Neurologist recommended dual antiplatelet therapy with aspirin and Plavix for 3 weeks and then to continue with Plavix alone as monotherapy after that time.  Continue statins.  Patient tolerating dysphagia 1 with thin liquids diet well.   Patient seen by physical therapist.  Skilled nursing facility placement recommended.  Case manager working on placement. Patient's daughter stated patient has been mostly bedbound since prior hip fracture last year.   2.  Essential hypertension-blood pressure controlled.  Continue atenolol.  3.  Hypothyroidism-continue Synthroid.   4.  GERD-continue Protonix.  5.  Depression-continue Zoloft.  6.  Dementia-continue Aricept.  DVT prophylaxis; Lovenox   All the records are reviewed and case discussed with Care Management/Social Worker.  Called patient's daughter listed in chart; Ms. [name] recently.  Updated on treatment plans and discharge plans.  CODE STATUS: DNR  TOTAL TIME TAKING  CARE OF THIS PATIENT: 33 minutes.   More than 50% of the time was spent in counseling/coordination of care: YES  POSSIBLE D/C IN 2 DAYS, DEPENDING ON CLINICAL CONDITION.   [name] M.D on 06/24/2019 at 12:02 PM  Between 7am to 6pm - Pager - [phone]  After 6pm go to [email or web address] - Proofreader  [facility]  Office  [phone]  CC: Primary care physician; [facility]  Note: This dictation was prepared with Dragon dictation along with smaller phrase technology. Any transcriptional errors that result from this process are unintentional.

## 2019-06-24 NOTE — Progress Notes (Signed)
Initial Nutrition Assessment  RD working remotely.  DOCUMENTATION CODES:   Not applicable  INTERVENTION:  Provide Ensure Enlive po once daily, each supplement provides 350 kcal and 20 grams of protein.  NUTRITION DIAGNOSIS:   Biting/chewing difficulty related to (loose fitting dentures) as evidenced by (per chart, need for dysphagia 1 diet.).  GOAL:   Patient will meet greater than or equal to 90% of their needs  MONITOR:   PO intake, Supplement acceptance, Labs, Weight trends, Skin, I & O's  REASON FOR ASSESSMENT:   Malnutrition Screening Tool    ASSESSMENT:   85 year old female with PMHx of HTN, hypothyroidism, Alzheimer's disease, anxiety, depression, HLD, vitamin D deficiency, GERD admitted with CVA.   -Following SLP evaluation on 10/27 diet was advanced to dysphagia 1 with thin liquids.  Patient unable to provide any history in setting of dementia. She is from [facility]. According to chart she ate 100% of her breakfast this morning. However per SLP note yesterday patient was easily fatigued with the meal and had loose fitting dentures. Patient would benefit from a once daily oral nutrition supplement. Will continue to monitor intake and adjust nutrition intervention as needed.  Patient's weight appears to be slowly trending down per chart. She was 74 kg on 06/09/2018 and is now 68 kg. That is not a significant weight loss for a year.  Medications reviewed and include: Colace 100 mg BID, fentanyl patch, ferrous sulfate 325 mg daily, levothyroxine, Remeron, pantoprazole, potassium chloride 40 mEq once today, Florastor 520 mg BID, sertraline, vitamin B12 1000 micrograms daily, NS at 75 mL/hr, ceftriaxone.  Labs reviewed: Potassium 3.1, Chloride 114.  NUTRITION - FOCUSED PHYSICAL EXAM:  Unable to complete at this time.   Diet Order:   Diet Order            DIET - DYS 1 Room service appropriate? Yes with Assist; Fluid consistency: Thin  Diet effective  now             EDUCATION NEEDS:   No education needs have been identified at this time  Skin:  Skin Assessment: Skin Integrity Issues:(MSAD to groin and buttocks; ecchymosis)  Last BM:  06/23/2019 per chart  Height:   Ht Readings from Last 1 Encounters:  06/22/19 5\' 5"  (1.651 m)   Weight:   Wt Readings from Last 1 Encounters:  06/22/19 68 kg   Ideal Body Weight:  56.8 kg  BMI:  Body mass index is 24.96 kg/m.  Estimated Nutritional Needs:   Kcal:  1400-1600  Protein:  70-80 grams  Fluid:  1.4-1.6 L/day  Willey Blade, MS, RD, LDN Office: [phone] Pager: [phone] After Hours/Weekend Pager: [phone]

## 2019-06-24 NOTE — TOC Initial Note (Signed)
Transition of Care [facility]) - Initial/Assessment Note    Patient Details  Name: Kerri Rangel MRN: [id number] Date of Birth: 07-07-1924  Transition of Care [facility]) CM/SW Contact:    Shelbie Hutching, RN Phone Number: 06/24/2019, 9:45 AM  Clinical Narrative:                 Patient admitted for stroke, history of dementia.  Patient is from [facility].  Per patient's daughter Kerri Rangel patient spends most of her time in a wheelchair and she likes to pull herself up and down the halls by the railings on the wall.  Patient was getting PT on and off at [facility].  PT has worked with patient here at the hospital and recommends SNF to improve mobility of left arm weakness.  Daughter would like for the patient to be able to regain the strength on her left side with rehab.  RNCM will begin bed search.  Patient has been to [facility] in the past.    Expected Discharge Plan: [place] Barriers to Discharge: Continued Medical Work up   Patient Goals and CMS Choice Patient states their goals for this hospitalization and ongoing recovery are:: Patient's daughter would like for the patient to regain her mobility in her left arm and maybe be able to walk again at some point CMS Medicare.gov Compare Post Acute Care list provided to:: Patient Represenative (must comment)(daughter Kerri Rangel) Choice offered to / list presented to : Adult Children  Expected Discharge Plan and Services Expected Discharge Plan: [place]   Discharge Planning Services: CM Consult   Living arrangements for the past 2 months: Assisted Living Facility(Memory Care)                                      Prior Living Arrangements/Services Living arrangements for the past 2 months: Assisted Living Facility(Memory Care) Lives with:: Facility Resident Patient language and need for interpreter reviewed:: No Do you feel safe going back to the place where you live?: Yes       Need for Family Participation in Patient Care: Yes (Comment)(dementia, stroke) Care giver support system in place?: Yes (comment)(daughter)   Criminal Activity/Legal Involvement Pertinent to Current Situation/Hospitalization: No - Comment as needed  Activities of Daily Living Home Assistive Devices/Equipment: None ADL Screening (condition at time of admission) Patient's cognitive ability adequate to safely complete daily activities?: No Is the patient deaf or have difficulty hearing?: No Does the patient have difficulty seeing, even when wearing glasses/contacts?: No Does the patient have difficulty concentrating, remembering, or making decisions?: Yes Patient able to express need for assistance with ADLs?: No Does the patient have difficulty dressing or bathing?: Yes Independently performs ADLs?: No Communication: Independent Dressing (OT): Needs assistance Is this a change from baseline?: Change from baseline, expected to last >3 days Grooming: Needs assistance Is this a change from baseline?: Change from baseline, expected to last >3 days Feeding: Needs assistance Is this a change from baseline?: Change from baseline, expected to last <3 days Bathing: Needs assistance Is this a change from baseline?: Change from baseline, expected to last >3 days Toileting: Needs assistance Is this a change from baseline?: Change from baseline, expected to last >3days In/Out Bed: Needs assistance Is this a change from baseline?: Change from baseline, expected to last >3 days Walks in Home: Needs assistance Is this a change from baseline?: Change  from baseline, expected to last >3 days Does the patient have difficulty walking or climbing stairs?: Yes Weakness of Legs: Both Weakness of Arms/Hands: Both  Permission Sought/Granted Permission sought to share information with : Case Manager, Customer service manager, Other (comment) Permission granted to share information with : Yes, Verbal  Permission Granted     Permission granted to share info w AGENCY: [facility], SNF  Permission granted to share info w Relationship: daughter Kerri Rangel     Emotional Assessment Appearance:: Appears stated age     Orientation: : Oriented to Self Alcohol / Substance Use: Not Applicable Psych Involvement: No (comment)  Admission diagnosis:  Cerebrovascular accident (CVA), unspecified mechanism ([place]) [I63.9] Patient Active Problem List   Diagnosis Date Noted  . Stroke ([place]) 06/23/2019  . CVA (cerebral vascular accident) ([place]) 06/22/2019  . Pressure ulcer, heel 11/08/2017  . Closed left hip fracture ([place]) 09/25/2017  . Carotid artery disease ([place]) 11/06/2016  . Anxiety 08/07/2016  . Depression 08/07/2016  . Hyperlipidemia, unspecified 08/07/2016  . Hypertension 08/07/2016  . Hypothyroidism 08/07/2016  . Vitamin D deficiency   . Dementia ([place])   . Common bile duct (CBD) obstruction 08/01/2016  . Gallstone 08/01/2016  . Late onset Alzheimer's disease with behavioral disturbance ([place]) 03/02/2015  . Dizzy 03/02/2014  . Edema 03/02/2014   PCP:  [facility]acy:   [id number] - [place], [place] - [address] [place] [address] Phone: [phone] Fax: [phone]     Social Determinants of Health (SDOH) Interventions    Readmission Risk Interventions No flowsheet data found.

## 2019-06-25 LAB — BASIC METABOLIC PANEL
Anion gap: 7 (ref 5–15)
BUN: 28 mg/dL — ABNORMAL HIGH (ref 8–23)
CO2: 20 mmol/L — ABNORMAL LOW (ref 22–32)
Calcium: 9.6 mg/dL (ref 8.9–10.3)
Chloride: 117 mmol/L — ABNORMAL HIGH (ref 98–111)
Creatinine, Ser: 0.74 mg/dL (ref 0.44–1.00)
GFR calc Af Amer: >60 mL/min (ref >60)
GFR calc non Af Amer: >60 mL/min (ref >60)
Glucose, Bld: 117 mg/dL — ABNORMAL HIGH (ref 70–99)
Potassium: 4.1 mmol/L (ref 3.5–5.1)
Sodium: 144 mmol/L (ref 135–145)

## 2019-06-25 LAB — MAGNESIUM: Magnesium: 2.1 mg/dL (ref 1.7–2.4)

## 2019-06-25 NOTE — Progress Notes (Signed)
[facility] [place] NAME: Kerri Rangel    MR#:  [id number]  DATE OF BIRTH:  [birth year]  SUBJECTIVE:  CHIEF COMPLAINT:   Chief Complaint  Patient presents with   Arm Problem   No new complaint this morning.  Sitting up in bed .  No fevers.  Left upper extremity prompted off with 2 pillows to help decrease swelling.  REVIEW OF SYSTEMS:  ROS unobtainable due to underlying dementia  DRUG ALLERGIES:  No Known Allergies VITALS:  Blood pressure (!) 143/51, pulse 63, temperature 98.5 F (36.9 C), temperature source Oral, resp. rate 17, height 5\' 5"  (1.651 m), weight 68 kg, SpO2 95 %. PHYSICAL EXAMINATION:  Physical Exam  GENERAL:  85 y.o.-year-old patient lying in the bed with no acute distress.  EYES: Pupils equal, round, reactive to light and accommodation. No scleral icterus. Extraocular muscles intact.  HEENT: Head atraumatic, normocephalic. Oropharynx and nasopharynx clear. No oropharyngeal erythema, moist oral mucosa  NECK:  Supple, no jugular venous distention. No thyroid enlargement, no tenderness.  LUNGS: Normal breath sounds bilaterally, no wheezing, rales, rhonchi. No use of accessory muscles of respiration.  CARDIOVASCULAR: S1, S2 RRR. No murmurs, rubs, gallops, clicks.  ABDOMEN: Soft, nontender, nondistended. Bowel sounds present. No organomegaly or mass.  EXTREMITIES: No pedal edema, cyanosis, or clubbing. + 2 pedal & radial pulses b/l.   NEUROLOGIC: Left upper extremity weakness with strength of 1/5 and strength of 3/5 on left lower extremity.  Right side is 4-5 over 5.  Gait not checked. PSYCHIATRIC: The patient is alert and oriented x 1.  SKIN: No obvious rash, lesion, or ulcer.  LABORATORY PANEL:  Female CBC Recent Labs  Lab 06/23/19 0452  WBC 7.8  HGB 11.3*  HCT 35.3*  PLT 190   ------------------------------------------------------------------------------------------------------------------ Chemistries    Recent Labs  Lab 06/25/19 0401  NA 144  K 4.1  CL 117*  CO2 20*  GLUCOSE 117*  BUN 28*  CREATININE 0.74  CALCIUM 9.6  MG 2.1   RADIOLOGY:  US Venous Img Upper Uni Left  Result Date: 06/24/2019 CLINICAL DATA:  Left arm edema. EXAM: LEFT UPPER EXTREMITY VENOUS DOPPLER ULTRASOUND TECHNIQUE: Gray-scale sonography with graded compression, as well as color Doppler and duplex ultrasound were performed to evaluate the upper extremity deep venous system from the level of the subclavian vein and including the jugular, axillary, basilic, radial, ulnar and upper cephalic vein. Spectral Doppler was utilized to evaluate flow at rest and with distal augmentation maneuvers. COMPARISON:  None. FINDINGS: Contralateral Subclavian Vein: Respiratory phasicity is normal and symmetric with the symptomatic side. No evidence of thrombus. Normal compressibility. Internal Jugular Vein: No evidence of thrombus. Normal compressibility, respiratory phasicity and response to augmentation. Subclavian Vein: No evidence of thrombus. Normal compressibility, respiratory phasicity and response to augmentation. Axillary Vein: No evidence of thrombus. Normal compressibility, respiratory phasicity and response to augmentation. Cephalic Vein: No evidence of thrombus. Normal compressibility, respiratory phasicity and response to augmentation. Basilic Vein: No evidence of thrombus. Normal compressibility, respiratory phasicity and response to augmentation. Brachial Veins: No evidence of thrombus. Normal compressibility, respiratory phasicity and response to augmentation. Radial Veins: No evidence of thrombus. Normal compressibility, respiratory phasicity and response to augmentation. Ulnar Veins: No evidence of thrombus. Normal compressibility, respiratory phasicity and response to augmentation. Venous Reflux:  None visualized. Other Findings: No evidence of superficial thrombophlebitis or abnormal fluid collection. IMPRESSION: No evidence  of DVT within the left upper extremity. Electronically Signed  By: Aletta Edouard M.D.   On: 06/24/2019 16:15   ASSESSMENT AND PLAN:   85 y.o. female with a known history of colon cancer, anxiety, depression, history of dementia, hypertension hyperlipidemia, hypothyroidism who presents to the hospital due to left upper extremity weakness.  1.  CVA/TIA-patient presented to the hospital with left upper extremity weakness.  Patient CT head suggestive of a right parietal subacute CVA.   MRI of the brain reviewed large acute posterior right MCA territory infarct without hemorrhage or mass effect.  Carotid Doppler ultrasound did not reveal any hemodynamically significant stenosis but revealed incomplete steal in the right vertebral artery suggests a stenosis in the more proximal subclavian or brachiocephalic arteries. 2D echocardiogram done with ejection fraction of 60 to 65%.  No mention of intracardiac thrombus.   Patient seen by neurologist.  Appreciate input.  Neurologist recommended dual antiplatelet therapy with aspirin and Plavix for 3 weeks and then to continue with Plavix alone as monotherapy after that time.  Continue statins.  Patient tolerating dysphagia 1 with thin liquids diet well.   Patient seen by physical therapist.  Skilled nursing facility placement recommended.  Case manager working on placement. Patient's daughter stated patient has been mostly bedbound since prior hip fracture last year.  Patient noted to have increased swelling of left upper extremity yesterday.  X-rays done with no acute fracture.  Venous Doppler ultrasound done with no DVT.  Left upper extremity prompted follow-up using 2 pillows to increase venous return and decrease swelling  2.  Essential hypertension-blood pressure fairly controlled.  Continue atenolol.  3.  Hypothyroidism-continue Synthroid.   4.  GERD-continue Protonix.  5.  Depression-continue Zoloft.  6.  Dementia-continue Aricept.  DVT  prophylaxis; Lovenox   All the records are reviewed and case discussed with Care Management/Social Worker. Called patient's daughter listed in chart; Ms. [name] recently.  Updated on treatment plans and discharge plans.  CODE STATUS: DNR  TOTAL TIME TAKING CARE OF THIS PATIENT: 34 minutes.   More than 50% of the time was spent in counseling/coordination of care: YES  POSSIBLE D/C IN 2 DAYS, DEPENDING ON CLINICAL CONDITION.   Mihcael Ledee M.D on 06/25/2019 at 2:19 PM  Between 7am to 6pm - Pager - [phone]  After 6pm go to [email or web address] - Proofreader  [facility]  Office  [phone]  CC: Primary care physician; [facility]  Note: This dictation was prepared with Dragon dictation along with smaller phrase technology. Any transcriptional errors that result from this process are unintentional.

## 2019-06-25 NOTE — TOC Progression Note (Signed)
Transition of Care [facility]) - Progression Note    Patient Details  Name: RYAH MIRRO MRN: [id number] Date of Birth: August 26, 1924  Transition of Care [facility]) CM/SW Contact  Shelbie Hutching, RN Phone Number: 06/25/2019, 2:07 PM  Clinical Narrative:    RNCM has spoken with daughter [name] today with updates.  [facility] is unable to accept patient back for short term rehab.  [facility] in [place] does not have any available beds at this time.   Olivia Mackie with [facility] will review patient information and let [facility] team know if they can make a bed offer.  [name] will accept bed offer with [facility] if offered.  Rhonda verbalizes understanding that if patient does not progress with rehab to be able to return to [facility] then the family will need to find alternative long term care placement.  [facility] does not currently have any long term care beds.     Expected Discharge Plan: [place] Barriers to Discharge: Continued Medical Work up  Expected Discharge Plan and Services Expected Discharge Plan: [place]   Discharge Planning Services: CM Consult   Living arrangements for the past 2 months: Assisted Living Facility(Memory Care)                                       Social Determinants of Health (SDOH) Interventions    Readmission Risk Interventions No flowsheet data found.

## 2019-06-25 NOTE — Progress Notes (Signed)
Physical Therapy Treatment Patient Details Name: Kerri Rangel MRN: [id number] DOB: Dec 30, 1923 Today's Date: 06/25/2019    History of Present Illness pt is a 85 yo female admitted for stroke work up after presenting to ED from [facility] with left arm weakness, head CT showed possible sided parietal infarct. PMH: colon cancer, anxiety, depression, dementia, HTN, HLD, hypothyroidism    PT Comments    Participated in exercises as described below.  Pt noted to be inc loose BM.   Tech called and in to assist with care.  Pt does well reaching with R to assist with rolling to L but requires max a to R due to flacid and painful LUE.  Pt fell quickly back to sleep after pericare.  Son in room.   Follow Up Recommendations  SNF     Equipment Recommendations       Recommendations for Other Services       Precautions / Restrictions Precautions Precautions: Fall Restrictions Weight Bearing Restrictions: No    Mobility  Bed Mobility Overal bed mobility: Needs Assistance Bed Mobility: Rolling Rolling: Min assist;Max assist         General bed mobility comments: able to roll well to left with rail but requires assist to hold position, max a to right  Transfers                    Ambulation/Gait                 Stairs             Wheelchair Mobility    Modified Rankin (Stroke Patients Only)       Balance                                            Cognition Arousal/Alertness: Lethargic Behavior During Therapy: WFL for tasks assessed/performed Overall Cognitive Status: History of cognitive impairments - at baseline                                        Exercises Other Exercises Other Exercises: BLE AAROM for ankle pumps, heel slides, SLR and ab/add Other Exercises: care for inc BM with nurse tech - rolling left/right    General Comments        Pertinent Vitals/Pain Pain Assessment: Faces Faces Pain Scale:  Hurts whole lot Pain Location: LUE; wrist/hand mainly but whole left side she grimmaces Pain Descriptors / Indicators: Aching;Constant;Grimacing;Guarding;Moaning Pain Intervention(s): Limited activity within patient's tolerance;Monitored during session;Repositioned    Home Living                      Prior Function            PT Goals (current goals can now be found in the care plan section) Progress towards PT goals: Progressing toward goals    Frequency           PT Plan Current plan remains appropriate    Co-evaluation              AM-PAC PT "6 Clicks" Mobility   Outcome Measure  Help needed turning from your back to your side while in a flat bed without using bedrails?: A Lot Help needed moving from lying on your back to sitting  on the side of a flat bed without using bedrails?: A Lot Help needed moving to and from a bed to a chair (including a wheelchair)?: Total Help needed standing up from a chair using your arms (e.g., wheelchair or bedside chair)?: Total Help needed to walk in hospital room?: Total Help needed climbing 3-5 steps with a railing? : Total 6 Click Score: 8    End of Session   Activity Tolerance: Patient tolerated treatment well;Patient limited by lethargy;Patient limited by pain Patient left: in bed;with call bell/phone within reach;with bed alarm set;with family/visitor present   Pain - Right/Left: Left Pain - part of body: Leg;Arm;Hand     Time: [id number] PT Time Calculation (min) (ACUTE ONLY): 24 min  Charges:  $Therapeutic Exercise: 8-22 mins $Therapeutic Activity: 8-22 mins                     Chesley Noon, PTA 06/25/19, 11:22 AM

## 2019-06-26 MED ORDER — HYDRALAZINE HCL 20 MG/ML IJ SOLN
10.0000 mg | INTRAMUSCULAR | Status: DC | PRN
  Administered 2019-06-27: 10 mg via INTRAVENOUS
  Filled 2019-06-26: qty 1

## 2019-06-26 MED ORDER — HYDRALAZINE HCL 50 MG PO TABS
25.0000 mg | ORAL_TABLET | Freq: Three times a day (TID) | ORAL | Status: DC
  Administered 2019-06-26 – 2019-06-29 (×8): 25 mg via ORAL
  Filled 2019-06-26 (×8): qty 1

## 2019-06-26 NOTE — Progress Notes (Signed)
[facility] at [place] NAME: Kerri Rangel    MR#:  [id number]  DATE OF BIRTH:  08-05-1924  SUBJECTIVE:  CHIEF COMPLAINT:   Chief Complaint  Patient presents with  . Arm Problem   No new complaint this morning.  No fevers.  Left upper extremity swelling improving by elevating the left arm.  REVIEW OF SYSTEMS:  ROS unobtainable due to underlying dementia  DRUG ALLERGIES:  No Known Allergies VITALS:  Blood pressure (!) 178/44, pulse (!) 56, temperature 98.2 F (36.8 C), temperature source Axillary, resp. rate 16, height 5\' 5"  (1.651 m), weight 68 kg, SpO2 96 %. PHYSICAL EXAMINATION:  Physical Exam  GENERAL:  85 y.o.-year-old patient lying in the bed with no acute distress.  EYES: Pupils equal, round, reactive to light and accommodation. No scleral icterus. Extraocular muscles intact.  HEENT: Head atraumatic, normocephalic. Oropharynx and nasopharynx clear. No oropharyngeal erythema, moist oral mucosa  NECK:  Supple, no jugular venous distention. No thyroid enlargement, no tenderness.  LUNGS: Normal breath sounds bilaterally, no wheezing, rales, rhonchi. No use of accessory muscles of respiration.  CARDIOVASCULAR: S1, S2 RRR. No murmurs, rubs, gallops, clicks.  ABDOMEN: Soft, nontender, nondistended. Bowel sounds present. No organomegaly or mass.  EXTREMITIES: No pedal edema, cyanosis, or clubbing. + 2 pedal & radial pulses b/l.   NEUROLOGIC: Left upper extremity weakness with strength of 1/5 and strength of 3/5 on left lower extremity.  Right side is 4-5 over 5.  Gait not checked. PSYCHIATRIC: The patient is alert and oriented x 1.  SKIN: No obvious rash, lesion, or ulcer.  LABORATORY PANEL:  Female CBC Recent Labs  Lab 06/23/19 0452  WBC 7.8  HGB 11.3*  HCT 35.3*  PLT 190   ------------------------------------------------------------------------------------------------------------------ Chemistries  Recent Labs  Lab 06/25/19  0401  NA 144  K 4.1  CL 117*  CO2 20*  GLUCOSE 117*  BUN 28*  CREATININE 0.74  CALCIUM 9.6  MG 2.1   RADIOLOGY:  No results found. ASSESSMENT AND PLAN:   85 y.o. female with a known history of colon cancer, anxiety, depression, history of dementia, hypertension hyperlipidemia, hypothyroidism who presents to the hospital due to left upper extremity weakness.  1.  CVA/TIA-patient presented to the hospital with left upper extremity weakness.  Patient CT head suggestive of a right parietal subacute CVA.   MRI of the brain reviewed large acute posterior right MCA territory infarct without hemorrhage or mass effect.  Carotid Doppler ultrasound did not reveal any hemodynamically significant stenosis but revealed incomplete steal in the right vertebral artery suggests a stenosis in the more proximal subclavian or brachiocephalic arteries. 2D echocardiogram done with ejection fraction of 60 to 65%.  No mention of intracardiac thrombus.   Patient seen by neurologist.  Appreciate input.  Neurologist recommended dual antiplatelet therapy with aspirin and Plavix for 3 weeks and then to continue with Plavix alone as monotherapy after that time.  Continue statins.  Patient tolerating dysphagia 1 with thin liquids diet well.   Patient seen by physical therapist.  Skilled nursing facility placement recommended.  Case manager working on placement. Patient's daughter stated patient has been mostly bedbound since prior hip fracture last year.  Patient noted to have increased swelling of left upper extremity yesterday.  X-rays done with no acute fracture.  Venous Doppler ultrasound done with no DVT.  Left upper extremity prompted follow-up using 2 pillows to increase venous return and decrease swelling  2.  Essential hypertension-blood  pressure fairly controlled.  Continue atenolol.  3.  Hypothyroidism-continue Synthroid.   4.  GERD-continue Protonix.  5.  Depression-continue Zoloft.  6.   Dementia-continue Aricept.  DVT prophylaxis; Lovenox   All the records are reviewed and case discussed with Care Management/Social Worker. Called patient's daughter listed in chart; Ms. [name] recently.  Updated on treatment plans and discharge plans. Case manager working on placement.  Repeat Covid test ordered today in anticipation for possible discharge tomorrow if bed available   CODE STATUS: DNR  TOTAL TIME TAKING CARE OF THIS PATIENT: 31 minutes.   More than 50% of the time was spent in counseling/coordination of care: YES  POSSIBLE D/C IN 2 DAYS, DEPENDING ON CLINICAL CONDITION.   Jolana Runkles M.D on 06/26/2019 at 12:22 PM  Between 7am to 6pm - Pager - [phone]  After 6pm go to [email or web address] - Proofreader  [facility]  Office  [phone]  CC: Primary care physician; [facility]  Note: This dictation was prepared with Dragon dictation along with smaller phrase technology. Any transcriptional errors that result from this process are unintentional.

## 2019-06-26 NOTE — Progress Notes (Signed)
PT Cancellation Note  Patient Details Name: Kerri Rangel MRN: [id number] DOB: [birth year]   Cancelled Treatment:    Reason Eval/Treat Not Completed: Other (comment).  Pt's HR noted to be 45 bpm on telemetry screen in hallway (sinus brady).  Per discussion with pt's nurse, will hold PT at this time d/t low HR.  Will re-attempt PT treatment session at a later date/time.  Leitha Bleak, PT 06/26/19, 12:06 PM [phone]

## 2019-06-26 NOTE — TOC Progression Note (Signed)
Transition of Care [facility]) - Progression Note    Patient Details  Name: DELPHINE CRYDER MRN: [id number] Date of Birth: 1923/09/16  Transition of Care [facility]) CM/SW [place], LCSW Phone Number: 06/26/2019, 1:16 PM  Clinical Narrative: [facility] bed. Daughter is aware. CSW called [employer] to start insurance authorization and MD placed order for a COVID test. [facility] can take her tomorrow.  Expected Discharge Plan: [place] Barriers to Discharge: Continued Medical Work up  Expected Discharge Plan and Services Expected Discharge Plan: [place]   Discharge Planning Services: CM Consult   Living arrangements for the past 2 months: Assisted Living Facility(Memory Care)                                       Social Determinants of Health (SDOH) Interventions    Readmission Risk Interventions No flowsheet data found.

## 2019-06-26 NOTE — Progress Notes (Signed)
Occupational Therapy Treatment Patient Details Name: Kerri Rangel MRN: [id number] DOB: 1924-06-05 Today's Date: 06/26/2019    History of present illness pt is a 85 yo female admitted for stroke work up after presenting to ED from [place] with left arm weakness, head CT showed possible sided parietal infarct. PMH: colon cancer, anxiety, depression, dementia, HTN, HLD, hypothyroidism   OT comments  Ms. Nwankwo was seen for OT treatment on this date. Upon arrival to room pt awake/alert with NT finishing up bed level toileting task this date. OT engages pt in BUE PROM exercises described below. Pt tolerates well with notably decreased pain this date, however, she does still endorse pain with active wrist and hand movements of her LUE. Exercises limited to ensure pt comfort. OT positioned pt LUE on pillows with a rolled towel in hand to encourage neutral positioning, minimize swelling, and maximize skin integrity this date. Pt progressing toward goals and continues to benefit from skilled OT services to maximize return to PLOF and minimize risk of future falls, injury, caregiver burden, and readmission. Will continue to follow POC. Discharge recommendation remains appropriate.    Follow Up Recommendations  SNF    Equipment Recommendations       Recommendations for Other Services      Precautions / Restrictions Precautions Precautions: Fall Restrictions Weight Bearing Restrictions: No       Mobility Bed Mobility Overal bed mobility: Needs Assistance Bed Mobility: Rolling Rolling: Max assist   Supine to sit: Mod assist;Max assist;+2 for physical assistance     General bed mobility comments: Per NT who was just finishing up cleaning pt this date, pt is able to roll well toward left rail and hold well with her RUE. Requires significantly more assistance when rolling toward the right.  Transfers                      Balance Overall balance assessment: Needs assistance;History  of Falls Sitting-balance support: Feet supported;Single extremity supported Sitting balance-Leahy Scale: Poor                                     ADL either performed or assessed with clinical judgement   ADL Overall ADL's : Needs assistance/impaired                                       General ADL Comments: Pt continues to require max assist for ADL mgt at bed level 2/2 weakness, cognition, and LUE pain. Pt able to follow simple single step cues. Pt on modified (pureed diet with thin liquids) per SLP, and requires assistance with self-feeding full meals. Pt required +2 assist for bed mobility and functional transfers.     Vision   Additional Comments: Pt unable to state   Perception     Praxis      Cognition Arousal/Alertness: Lethargic Behavior During Therapy: WFL for tasks assessed/performed Overall Cognitive Status: History of cognitive impairments - at baseline                                          Exercises Hand Exercises Forearm Supination: PROM;Both;5 reps Forearm Pronation: PROM;Both;5 reps Low Level/ICU Exercises Ankle Circles/Pumps: Both;5 reps;PROM Shoulder Flexion: PROM;Both;5  reps;Limitations Shoulder Flexion Limitations: Pt limited by pain in LUE this date. Elbow Flexion: Both;5 reps Other Exercises Other Exercises: Pt engaged in PROM exercises listed above with LUE re-positioned on pillows with a rolled washcloth in her hand to maintain neutral positioning, minimize swelling, and maximize skin integrity this date.   Shoulder Instructions       General Comments Pt LUE swelling notably improved this date despite continued focal swelling over L wrist. On this date pt noted to have increased swelling in her RUE particularly around IV site. RN aware.    Pertinent Vitals/ Pain       Pain Assessment: Faces Faces Pain Scale: Hurts little more Pain Location: LUE; wrist/hand mainly but whole left side she  grimmaces Pain Descriptors / Indicators: Aching;Constant;Grimacing;Guarding;Moaning Pain Intervention(s): Limited activity within patient's tolerance;Monitored during session;Repositioned  Home Living                                          Prior Functioning/Environment              Frequency  Min 2X/week        Progress Toward Goals  OT Goals(current goals can now be found in the care plan section)  Progress towards OT goals: Progressing toward goals  Acute Rehab OT Goals Patient Stated Goal: Pt unable to state OT Goal Formulation: Patient unable to participate in goal setting Time For Goal Achievement: 07/08/19  Plan Discharge plan remains appropriate;Frequency remains appropriate    Co-evaluation                 AM-PAC OT "6 Clicks" Daily Activity     Outcome Measure   Help from another person eating meals?: A Lot Help from another person taking care of personal grooming?: A Lot Help from another person toileting, which includes using toliet, bedpan, or urinal?: Total Help from another person bathing (including washing, rinsing, drying)?: Total Help from another person to put on and taking off regular upper body clothing?: A Lot Help from another person to put on and taking off regular lower body clothing?: A Lot 6 Click Score: 10    End of Session    OT Visit Diagnosis: Other abnormalities of gait and mobility (R26.89);Pain;History of falling (Z91.81);Hemiplegia and hemiparesis Hemiplegia - Right/Left: Left Hemiplegia - dominant/non-dominant: Dominant Hemiplegia - caused by: Cerebral infarction Pain - Right/Left: Left Pain - part of body: Arm;Hand   Activity Tolerance Patient tolerated treatment well   Patient Left in bed;with bed alarm set;with call bell/phone within reach   Nurse Communication          Time: 1415-1431 OT Time Calculation (min): 16 min  Charges: OT General Charges $OT Visit: 1 Visit OT  Treatments $Therapeutic Exercise: 8-22 mins  Shara Blazing, M.S., OTR/L Ascom: [phone] 06/26/19, 4:19 PM

## 2019-06-26 NOTE — Care Management Important Message (Signed)
Important Message  Patient Details  Name: BRIANCA SHIH MRN: [id number] Date of Birth: [birth year]   Medicare Important Message Given:  Yes     Juliann Pulse A Rodolph Hagemann 06/26/2019, 11:48 AM

## 2019-06-27 LAB — CBC
HCT: 34.7 % — ABNORMAL LOW (ref 36.0–46.0)
Hemoglobin: 11.1 g/dL — ABNORMAL LOW (ref 12.0–15.0)
MCH: 30.2 pg (ref 26.0–34.0)
MCHC: 32 g/dL (ref 30.0–36.0)
MCV: 94.3 fL (ref 80.0–100.0)
Platelets: 271 10*3/uL (ref 150–400)
RBC: 3.68 MIL/uL — ABNORMAL LOW (ref 3.87–5.11)
RDW: 12.8 % (ref 11.5–15.5)
WBC: 10.2 10*3/uL (ref 4.0–10.5)
nRBC: 0 % (ref 0.0–0.2)

## 2019-06-27 LAB — URINE CULTURE: Culture: 80000 — AB

## 2019-06-27 LAB — BASIC METABOLIC PANEL
Anion gap: 3 — ABNORMAL LOW (ref 5–15)
BUN: 19 mg/dL (ref 8–23)
CO2: 23 mmol/L (ref 22–32)
Calcium: 10.3 mg/dL (ref 8.9–10.3)
Chloride: 116 mmol/L — ABNORMAL HIGH (ref 98–111)
Creatinine, Ser: 0.6 mg/dL (ref 0.44–1.00)
GFR calc Af Amer: >60 mL/min (ref >60)
GFR calc non Af Amer: >60 mL/min (ref >60)
Glucose, Bld: 108 mg/dL — ABNORMAL HIGH (ref 70–99)
Potassium: 3.6 mmol/L (ref 3.5–5.1)
Sodium: 142 mmol/L (ref 135–145)

## 2019-06-27 LAB — SARS CORONAVIRUS 2 (TAT 6-24 HRS): SARS Coronavirus 2: NEGATIVE

## 2019-06-27 LAB — MAGNESIUM: Magnesium: 1.8 mg/dL (ref 1.7–2.4)

## 2019-06-27 NOTE — Progress Notes (Signed)
CSW received call from [facility]- patient has received initial denial from insurance for SNF placement. Requesting peer-to-peer to be completed; MD is Dr. Amalia Hailey [phone]. MD notified.   Kingsley Spittle, LCSW [facility] [place]  [phone]

## 2019-06-27 NOTE — Progress Notes (Signed)
[facility] at [place] NAME: Kerri Rangel    MR#:  [id number]  DATE OF BIRTH:  1923-10-16  SUBJECTIVE:  CHIEF COMPLAINT:   Chief Complaint  Patient presents with  . Arm Problem   No new complaint this morning.  Sitting up in bed and being assisted with breakfast.  No fevers.  No chest pain No shortness of breath  REVIEW OF SYSTEMS:  ROS unobtainable due to underlying dementia  DRUG ALLERGIES:  No Known Allergies VITALS:  Blood pressure 123/87, pulse (!) 52, temperature (!) 97.5 F (36.4 C), resp. rate (!) 22, height 5\' 5"  (1.651 m), weight 68 kg, SpO2 96 %. PHYSICAL EXAMINATION:  Physical Exam  GENERAL:  85 y.o.-year-old patient lying in the bed with no acute distress.  EYES: Pupils equal, round, reactive to light and accommodation. No scleral icterus. Extraocular muscles intact.  HEENT: Head atraumatic, normocephalic. Oropharynx and nasopharynx clear. No oropharyngeal erythema, moist oral mucosa  NECK:  Supple, no jugular venous distention. No thyroid enlargement, no tenderness.  LUNGS: Normal breath sounds bilaterally, no wheezing, rales, rhonchi. No use of accessory muscles of respiration.  CARDIOVASCULAR: S1, S2 RRR. No murmurs, rubs, gallops, clicks.  ABDOMEN: Soft, nontender, nondistended. Bowel sounds present. No organomegaly or mass.  EXTREMITIES: No pedal edema, cyanosis, or clubbing. + 2 pedal & radial pulses b/l.   NEUROLOGIC: Left upper extremity weakness with strength of 1/5 and strength of 3/5 on left lower extremity.  Right side is 4-5 over 5.  Gait not checked. PSYCHIATRIC: The patient is alert and oriented x 1.  SKIN: No obvious rash, lesion, or ulcer.  LABORATORY PANEL:  Female CBC Recent Labs  Lab 06/27/19 0623  WBC 10.2  HGB 11.1*  HCT 34.7*  PLT 271   ------------------------------------------------------------------------------------------------------------------ Chemistries  Recent Labs  Lab 06/27/19  0623  NA 142  K 3.6  CL 116*  CO2 23  GLUCOSE 108*  BUN 19  CREATININE 0.60  CALCIUM 10.3  MG 1.8   RADIOLOGY:  No results found. ASSESSMENT AND PLAN:   85 y.o. female with a known history of colon cancer, anxiety, depression, history of dementia, hypertension hyperlipidemia, hypothyroidism who presents to the hospital due to left upper extremity weakness.  1.  CVA/TIA-patient presented to the hospital with left upper extremity weakness.  Patient CT head suggestive of a right parietal subacute CVA.   MRI of the brain reviewed large acute posterior right MCA territory infarct without hemorrhage or mass effect.  Carotid Doppler ultrasound did not reveal any hemodynamically significant stenosis but revealed incomplete steal in the right vertebral artery suggests a stenosis in the more proximal subclavian or brachiocephalic arteries. 2D echocardiogram done with ejection fraction of 60 to 65%.  No mention of intracardiac thrombus.   Patient seen by neurologist.  Appreciate input.  Neurologist recommended dual antiplatelet therapy with aspirin and Plavix for 3 weeks and then to continue with Plavix alone as monotherapy after that time.  Continue statins.  Patient tolerating dysphagia 1 with thin liquids diet well.   Patient seen by physical therapist.  Skilled nursing facility placement recommended.  Case manager working on placement. Patient's daughter stated patient has been mostly bedbound since prior hip fracture last year.   2.  Essential hypertension-blood pressure controlled.  Continue atenolol at decreased dose secondary to bradycardia  3.  Hypothyroidism-continue Synthroid.   4.  GERD-continue Protonix.  5.  Depression-continue Zoloft.  6.  Dementia-continue Aricept.   7.  Discussed  with physician Dr. Annie Main, insurance authorization has been denied as patient does participate in physical therapy and rehab.  Patient has been denied authorization for rehab placement.  8.   Palliative care consult for goals of care discussion with family  DVT prophylaxis; Lovenox   All the records are reviewed and case discussed with Care Management/Social Worker. Called patient's daughter listed in chart; Ms. [name] recently.  Updated on treatment plans and discharge plans.  CODE STATUS: DNR  TOTAL TIME TAKING CARE OF THIS PATIENT: 35 minutes.   More than 50% of the time was spent in counseling/coordination of care: YES  POSSIBLE D/C IN 2 DAYS, DEPENDING ON CLINICAL CONDITION.   Saundra Shelling M.D on 06/27/2019 at 12:13 PM  Between 7am to 6pm - Pager - [phone]  After 6pm go to [email or web address] - Proofreader  [facility] [place] [facility]  Office  [phone]  CC: Primary care physician; [facility]  Note: This dictation was prepared with Dragon dictation along with smaller phrase technology. Any transcriptional errors that result from this process are unintentional.

## 2019-06-27 NOTE — Progress Notes (Signed)
RN notified by telemetry that patient had a temporary bradycardic moment down to 35.  HR did not sustain in the 30's.  Pt recovered to 60's.  Notification appeared to be around the time patient was laid flat for repositioning.

## 2019-06-27 NOTE — Progress Notes (Signed)
PT Cancellation Note  Patient Details Name: Kerri Rangel MRN: [id number] DOB: 1923-12-10   Cancelled Treatment:    Reason Eval/Treat Not Completed: Other (comment)   Pt sound asleep in bed.  Did not awaken to gentle voice or tactile stim.  Only briefly opened her eyes when I adjusted blankets.  Of note HR remains low in brady sinus with inc respirations.  Will hold session at this time.   Chesley Noon 06/27/2019, 11:44 AM

## 2019-06-27 NOTE — Progress Notes (Signed)
RN notified by telemetry of a 2.2sec pause.  Patient because bradycardic soon after down to 38 and recovered to the 40's where she sustained. RN assess pt Neuro status at this time. Patient is drowsy but easily aroused with verbal stimulation.  Pt sbp remained stable in the 130's.  MD notified.  Rn will continue to monitor clinical presentation.

## 2019-06-27 NOTE — Plan of Care (Signed)
Pt has some movement in left hand and left foot. No signs of distress noted. Will continue to monitor.

## 2019-06-28 DIAGNOSIS — E039 Hypothyroidism, unspecified: Secondary | ICD-10-CM

## 2019-06-28 DIAGNOSIS — G301 Alzheimer's disease with late onset: Secondary | ICD-10-CM

## 2019-06-28 DIAGNOSIS — F0281 Dementia in other diseases classified elsewhere with behavioral disturbance: Secondary | ICD-10-CM

## 2019-06-28 DIAGNOSIS — I10 Essential (primary) hypertension: Secondary | ICD-10-CM

## 2019-06-28 NOTE — Progress Notes (Signed)
Physical Therapy Treatment Patient Details Name: Kerri Rangel MRN: [id number] DOB: 15-Jul-1924 Today's Date: 06/28/2019    History of Present Illness pt is a 85 yo female admitted for stroke work up after presenting to ED from [place] with left arm weakness, head CT showed possible sided parietal infarct. PMH: colon cancer, anxiety, depression, dementia, HTN, HLD, hypothyroidism    PT Comments    Patient performs AAROM and PROM to RUE and RLE , PROM to LLE in supine. Patient has minimal participation in exercises and needs max assist for bed mobility rolling left with hand over hand assist to reach for railing on left side of bed. Patient will continue to benefit from skilled PT to  Improve mobility and strength.   Follow Up Recommendations  SNF     Equipment Recommendations       Recommendations for Other Services       Precautions / Restrictions Restrictions Weight Bearing Restrictions: No    Mobility  Bed Mobility Overal bed mobility: Needs Assistance Bed Mobility: Rolling Rolling: Max assist         General bed mobility comments: needs VC and TC and hand over hand  Transfers                    Ambulation/Gait                 Stairs             Wheelchair Mobility    Modified Rankin (Stroke Patients Only)       Balance                                            Cognition Arousal/Alertness: Awake/alert Behavior During Therapy: Flat affect Overall Cognitive Status: No family/caregiver present to determine baseline cognitive functioning                                        Exercises      General Comments        Pertinent Vitals/Pain Pain Assessment: Faces Faces Pain Scale: Hurts a little bit Pain Location: (RLE ) Pain Descriptors / Indicators: Aching Pain Intervention(s): Limited activity within patient's tolerance    Home Living                      Prior Function             PT Goals (current goals can now be found in the care plan section) Acute Rehab PT Goals Patient Stated Goal: Pt unable to state Progress towards PT goals: Progressing toward goals    Frequency    7X/week      PT Plan Current plan remains appropriate    Co-evaluation              AM-PAC PT "6 Clicks" Mobility   Outcome Measure  Help needed turning from your back to your side while in a flat bed without using bedrails?: Total Help needed moving from lying on your back to sitting on the side of a flat bed without using bedrails?: Total Help needed moving to and from a bed to a chair (including a wheelchair)?: Total Help needed standing up from a chair using your arms (e.g., wheelchair or bedside chair)?: Total  Help needed to walk in hospital room?: Total Help needed climbing 3-5 steps with a railing? : Total 6 Click Score: 6    End of Session   Activity Tolerance: Patient limited by lethargy Patient left: in bed;with call bell/phone within reach Nurse Communication: Mobility status PT Visit Diagnosis: Muscle weakness (generalized) (M62.81)     Time: 1445-1500 PT Time Calculation (min) (ACUTE ONLY): 15 min  Charges:  $Therapeutic Exercise: 8-22 mins                        Alanson Puls, PT DPT 06/28/2019, 4:54 PM

## 2019-06-28 NOTE — Plan of Care (Signed)
  Problem: Education: Goal: Knowledge of disease or condition will improve Outcome: Not Progressing Goal: Knowledge of secondary prevention will improve Outcome: Not Progressing Goal: Knowledge of patient specific risk factors addressed and post discharge goals established will improve Outcome: Not Progressing   Problem: Coping: Goal: Will verbalize positive feelings about self Outcome: Not Progressing Goal: Will identify appropriate support needs Outcome: Not Progressing   Problem: Health Behavior/Discharge Planning: Goal: Ability to manage health-related needs will improve Outcome: Not Progressing   Problem: Self-Care: Goal: Ability to participate in self-care as condition permits will improve Outcome: Not Progressing Goal: Verbalization of feelings and concerns over difficulty with self-care will improve Outcome: Not Progressing Goal: Ability to communicate needs accurately will improve Outcome: Not Progressing   Problem: Nutrition: Goal: Risk of aspiration will decrease Outcome: Not Progressing   Problem: Ischemic Stroke/TIA Tissue Perfusion: Goal: Complications of ischemic stroke/TIA will be minimized Outcome: Not Progressing   Problem: Health Behavior/Discharge Planning: Goal: Ability to manage health-related needs will improve Outcome: Not Progressing   Problem: Clinical Measurements: Goal: Ability to maintain clinical measurements within normal limits will improve Outcome: Not Progressing Goal: Will remain free from infection Outcome: Not Progressing Goal: Diagnostic test results will improve Outcome: Not Progressing   Problem: Activity: Goal: Risk for activity intolerance will decrease Outcome: Not Progressing   Problem: Coping: Goal: Level of anxiety will decrease Outcome: Not Progressing   Problem: Elimination: Goal: Will not experience complications related to bowel motility Outcome: Not Progressing Goal: Will not experience complications related  to urinary retention Outcome: Not Progressing   Problem: Pain Managment: Goal: General experience of comfort will improve Outcome: Not Progressing   Problem: Safety: Goal: Ability to remain free from injury will improve Outcome: Not Progressing   Problem: Skin Integrity: Goal: Risk for impaired skin integrity will decrease Outcome: Not Progressing

## 2019-06-28 NOTE — Progress Notes (Signed)
PROGRESS NOTE  Kerri Rangel [id number] DOB: [birth year] DOA: 06/22/2019 PCP: [facility]  Brief History   85 year old woman PMH colon cancer, dementia, anxiety, depression, hypothyroidism presented with left upper extremity weakness, recent fall with injury to left arm, CT head suggested possible parietal infarct.  A & P  Large acute posterior right MCA territory CVA without hemorrhage.  Carotid artery ultrasound no significant ICA stenosis but revealed incomplete steal right vertebral artery, stenosis proximal subclavian or brachiocephalic arteries.  Echocardiogram unremarkable.  Seen by neurology, with recommendation for aspirin and Plavix. --Aspirin, Plavix for 3 weeks then continue Plavix alone --Dysphagia 1 diet with thin liquids --SNF per physical therapy  Essential hypertension --Permissive hypertension.  Atenolol on hold, resume on discharge.  Hypothyroidism --Continue levothyroxine  PMH colon cancer  Late onset Alzheimer's dementia with behavioral disturbance --Appears stable.  With bradycardia we will stop donepezil.  DVT prophylaxis: enoxaparin Code Status: DNR Family Communication: none Disposition Plan: SNF    Murray Hodgkins, MD  [facility] Direct contact: see [email or web address] (further directions at bottom of note if needed) 7PM-7AM contact night coverage as at bottom of note 06/28/2019, 3:01 PM  LOS: 5 days   Significant Hospital Events   .    Consults:  .    Procedures:  .   Significant Diagnostic Tests:  Marland Kitchen    Micro Data:  .    Antimicrobials:  .   Interval History/Subjective  "Leave me alone".  Objective   Vitals:  Vitals:   06/27/19 2020 06/28/19 0356  BP: (!) 165/58 (!) 108/43  Pulse: (!) 57 (!) 56  Resp: 18 19  Temp: 98.2 F (36.8 C) 99.7 F (37.6 C)  SpO2: 98% 95%    Exam:  Constitutional: Sleeping but arouses to voice.  Does not follow commands. ENT: Grossly normal hearing Respiratory: Clear to  auscultation bilaterally.  No wheezes, rales or rhonchi.  Normal respiratory effort. Cardiovascular: Regular rate and rhythm.  No rub or gallop.  No lower extremity edema. Abdomen: Soft, nontender, nondistended. Musculoskeletal: Difficult assess, does not follow commands. Neurologic: Difficult to assess, does not follow commands moves both legs spontaneously. Psychiatric: Difficult to assess, not able to currently assess mood or affect.  I have personally reviewed the following:   Today's Data  . None today  Scheduled Meds: . aspirin  81 mg Oral Daily  . atorvastatin  40 mg Oral q1800  . busPIRone  15 mg Oral BID  . clopidogrel  75 mg Oral Daily  . docusate sodium  100 mg Oral BID  . enoxaparin (LOVENOX) injection  40 mg Subcutaneous Q24H  . feeding supplement (ENSURE ENLIVE)  237 mL Oral Q24H  . fentaNYL  1 patch Transdermal Q72H  . ferrous sulfate  325 mg Oral Q breakfast  . hydrALAZINE  25 mg Oral Q8H  . levothyroxine  50 mcg Oral QAC breakfast  . mirtazapine  30 mg Oral QHS  . pantoprazole  40 mg Oral Daily  . saccharomyces boulardii  250 mg Oral BID  . sertraline  25 mg Oral Daily  . vitamin B-12  1,000 mcg Oral Daily   Continuous Infusions:  Principal Problem:   CVA (cerebral vascular accident) ([place]) Active Problems:   Hypothyroidism   Late onset Alzheimer's disease with behavioral disturbance ([place])   Dementia ([place])   Stroke ([place])   Essential hypertension   LOS: 5 days   How to contact the [facility] Attending or Consulting provider [place] or covering provider during after  hours 7P -7A, for this patient?  1. Check the care team in [facility] and look for a) attending/consulting TRH provider listed and b) the [facility] team listed 2. Log into [email or web address] and use [place]'s universal password to access. If you do not have the password, please contact the hospital operator. 3. Locate the [facility] provider you are looking for under [facility] and page to a number that you can be  directly reached. 4. If you still have difficulty reaching the provider, please page the [facility] (Director on Call) for the Hospitalists listed on amion for assistance.

## 2019-06-28 NOTE — Plan of Care (Signed)
Pt is able to hold her left hand and foot better this shift than before. No other signs of distress noted. Will continue to monitor.

## 2019-06-29 LAB — CREATININE, SERUM
Creatinine, Ser: 0.81 mg/dL (ref 0.44–1.00)
GFR calc Af Amer: >60 mL/min (ref >60)
GFR calc non Af Amer: >60 mL/min (ref >60)

## 2019-06-29 NOTE — TOC Progression Note (Signed)
Transition of Care [facility]) - Progression Note    Patient Details  Name: Kerri Rangel MRN: [id number] Date of Birth: 1923-09-14  Transition of Care [facility] CM/SW Contact  Pravin Perezperez, Lenice Llamas Phone Number: [phone]  06/29/2019, 4:45 PM  Clinical Narrative: Clinical Social Worker (CSW) contacted patient's daughter [name] to discuss D/C plan. Health Team denied SNF and continued to deny SNF after peer to peer was completed. Palliative consult is pending. CSW discussed private pay options at SNF and discussed hospice options. CSW explained to daughter that palliative care will determine if patient is eligible for hospice services. Daughter is open to hospice services but will also consider private paying at [facility] for rehab. CSW provided emotional support and will continue to follow and assist as needed.                                                                               Expected Discharge Plan: [place] Barriers to Discharge: Continued Medical Work up  Expected Discharge Plan and Services Expected Discharge Plan: [place]   Discharge Planning Services: CM Consult   Living arrangements for the past 2 months: Assisted Living Facility(Memory Care)                                       Social Determinants of Health (SDOH) Interventions    Readmission Risk Interventions No flowsheet data found.

## 2019-06-29 NOTE — Progress Notes (Signed)
PT Cancellation Note  Patient Details Name: Kerri Rangel MRN: [id number] DOB: [birth year]   Cancelled Treatment:    Reason Eval/Treat Not Completed: Fatigue/lethargy limiting ability to participate. Treatment attempted; pt lethargic and unable to awaken for participation. Pt only moves eyes under closed lids in response to attempts to awaken. Attempt at a later date if pt able to participate.    Larae Grooms, PTA 06/29/2019, 4:12 PM

## 2019-06-29 NOTE — Care Management Important Message (Signed)
Important Message  Patient Details  Name: Kerri Rangel MRN: [id number] Date of Birth: 10-Apr-1924   Medicare Important Message Given:  Yes     Juliann Pulse A Mohmmad Saleeby 06/29/2019, 1:08 PM

## 2019-06-29 NOTE — Progress Notes (Signed)
PROGRESS NOTE  Kerri Rangel [id number] DOB: [birth year] DOA: 06/22/2019 PCP: [facility]  Brief History   85 year old woman PMH colon cancer, dementia, anxiety, depression, hypothyroidism presented with left upper extremity weakness.  Had recently been seen in the ED for superficial skin tear left side.  CT head suggested possible right parietal infarct.  MRI showed large acute posterior right MCA territory infarct without hemorrhage.  Neurology with recommendations for aspirin and Plavix for 3 weeks then Plavix alone.  SNF recommended but insurance company denied rehab.  10/31 attending physician discussed peer to peer and insurance authorization denial was upheld.  A & P  Large acute posterior right MCA infarct without hemorrhage.  Etiology likely embolic.  On aspirin prior to admission.  Carotid artery ultrasound no significant ICA stenosis but revealed incomplete steal in right vertebral artery which suggests stenosis proximal subclavian or brachiocephalic arteries.  Echocardiogram showed no cardiac source of emboli.  LDL 111.  A1c 4.9.  Seen by neurology, with recommendation for aspirin and Plavix. --Continue aspirin, Plavix for 3 weeks then continue Plavix alone.  Continue statin. --Continue dysphagia 1 diet with thin liquids --SNF per physical therapy but this was denied by insurance company.  -- Palliative medicine consulted.  Left wrist pain status post fall prior to admission --Repeat imaging this admission was negative for fracture or acute abnormality in the left wrist.  Extensive degenerative changes were seen.  Left humerus film was negative.  Left hand film showed extensive degenerative changes.  No acute abnormality.  Left forearm film was negative. --No acute abnormalities on examination.  Pain and swelling secondary to fall complicated by severe degenerative changes.  Essential hypertension --Low normal blood pressure.  Atenolol and hydralazine on hold.   Sinus bradycardia --Atenolol held.  Donepezil discontinued.  Hypothyroidism --Continue levothyroxine  Asymptomatic bacteriuria.  Urine culture noted.  Given lack of fever or symptoms, no treatment is indicated. --No treatment indicated.  PMH colon cancer  Late onset Alzheimer's dementia with behavioral disturbance --Appears stable.  With bradycardia stopped donezepil. --Mostly bedbound since hip fracture last year per chart  Circumferential wall thickening of the upper esophagus likely due to reflux esophagitis  Palliative care consultation pending.  Possibly hospice versus private pay SNF.  DVT prophylaxis: enoxaparin Code Status: DNR Family Communication: none Disposition Plan: SNF    Murray Hodgkins, MD  [facility] Direct contact: see [email or web address] (further directions at bottom of note if needed) 7PM-7AM contact night coverage as at bottom of note 06/29/2019, 8:05 AM  LOS: 6 days   Significant Hospital Events   . 10/26 admitted for left-sided weakness, possible stroke. Marland Kitchen 10/27 neurology consultation . 10/31 peer-to-peer conducted, insurance company denied rehab   Consults:  . Neurology   Procedures:  .   Significant Diagnostic Tests:  . CT head AC attenuation right posterior parietal region likely acute to subacute worse infarction.  No hemorrhage. . CT of her cervical spine, no acute cervical spine injury. . Imaging of left humerus, forearm, wrist and hand was negative for acute abnormalities . Chest x-ray no acute disease . MRI brain large acute posterior right MCA infarct without hemorrhage or mass-effect. . Carotid ultrasound: Mild stenosis right ICA, mild stenosis left ICA.  Incomplete steal right vertebral artery suggest stenosis in more proximal subclavian or brachiocephalic arteries. . Left upper extremity venous duplex no evidence of DVT.   Micro Data:  .    Antimicrobials:     Diet recommendations: Dysphagia 1 (puree);Thin liquid Liquids  provided via:  Cup Medication Administration: Crushed with puree(for safer swallowing) Supervision: Staff to assist with self feeding;Intermittent supervision to cue for compensatory strategies Compensations: Minimize environmental distractions;Slow rate;Small sips/bites;Lingual sweep for clearance of pocketing;Multiple dry swallows after each bite/sip;Follow solids with liquid Postural Changes and/or Swallow Maneuvers: Seated upright 90 degrees;Upright 30-60 min after meal  Interval History/Subjective  No new issues.  Seems to feel okay.  Objective   Vitals:  Vitals:   06/28/19 1947 06/29/19 0534  BP: (!) 151/63 (!) 130/97  Pulse: 69 81  Resp: 18 18  Temp: 98.4 F (36.9 C) 97.8 F (36.6 C)  SpO2: 97% 98%    Exam: Constitutional: Appears calm, comfortable. Respiratory: Clear to auscultation bilaterally.  No wheezes, rales or rhonchi.  Normal respiratory effort. Cardiovascular: Regular rate and rhythm.  No murmur, rub or gallop.  No lower extremity edema. Left upper extremity: Some swelling at the wrist.  No pain with passive manipulation of the wrist or elbow or palpation of the upper arm. Psychiatric: Briefly arouses, does not participate with examination.   I have personally reviewed the following:   Today's Data  . Creatinine within normal limits  Scheduled Meds: . aspirin  81 mg Oral Daily  . atorvastatin  40 mg Oral q1800  . busPIRone  15 mg Oral BID  . clopidogrel  75 mg Oral Daily  . docusate sodium  100 mg Oral BID  . enoxaparin (LOVENOX) injection  40 mg Subcutaneous Q24H  . feeding supplement (ENSURE ENLIVE)  237 mL Oral Q24H  . fentaNYL  1 patch Transdermal Q72H  . ferrous sulfate  325 mg Oral Q breakfast  . hydrALAZINE  25 mg Oral Q8H  . levothyroxine  50 mcg Oral QAC breakfast  . mirtazapine  30 mg Oral QHS  . pantoprazole  40 mg Oral Daily  . saccharomyces boulardii  250 mg Oral BID  . sertraline  25 mg Oral Daily  . vitamin B-12  1,000 mcg Oral Daily    Continuous Infusions:  Principal Problem:   CVA (cerebral vascular accident) ([place]) Active Problems:   Hypothyroidism   Late onset Alzheimer's disease with behavioral disturbance ([place])   Dementia ([place])   Stroke ([place])   Essential hypertension   LOS: 6 days   How to contact the [facility] Attending or Consulting provider [place] or covering provider during after hours [place], for this patient?  1. Check the care team in [facility] and look for a) attending/consulting TRH provider listed and b) the [facility] team listed 2. Log into [email or web address] and use [place]'s universal password to access. If you do not have the password, please contact the hospital operator. 3. Locate the [facility] provider you are looking for under [facility] and page to a number that you can be directly reached. 4. If you still have difficulty reaching the provider, please page the [facility] (Director on Call) for the Hospitalists listed on amion for assistance.

## 2019-06-30 DIAGNOSIS — Z515 Encounter for palliative care: Secondary | ICD-10-CM

## 2019-06-30 DIAGNOSIS — Z7189 Other specified counseling: Secondary | ICD-10-CM

## 2019-06-30 MED ORDER — SENNOSIDES-DOCUSATE SODIUM 8.6-50 MG PO TABS
1.0000 | ORAL_TABLET | Freq: Two times a day (BID) | ORAL | Status: DC
  Administered 2019-06-30 – 2019-07-05 (×10): 1 via ORAL
  Filled 2019-06-30 (×11): qty 1

## 2019-06-30 NOTE — Consult Note (Signed)
Consultation Note Date: 06/30/2019   Patient Name: Kerri Rangel  DOB: 05-06-1924  MRN: [id number]  Age / Sex: 85 y.o., female  PCP: [facility] Referring Physician: Samuella Cota, MD  Reason for Consultation: Establishing goals of care  HPI/Patient Profile: 85 year old woman PMH colon cancer, dementia, anxiety, depression, hypothyroidism presented with left upper extremity weakness.  Had recently been seen in the ED for superficial skin tear left side.  CT head suggested possible right parietal infarct.  MRI showed large acute posterior right MCA territory infarct without hemorrhage.  Clinical Assessment and Goals of Care: Patient is resting in bed with eyes closed. She awakens to voice and touch, and denies pain or other symptom. She states her name and that she is in the hospital. She states "I have 3 or 4 children".   Spoke with her daughter [name]. [name] has 3 siblings. The oldest child died 91 weeks after her father, Ms. Cupo husband.  [name] is very tearful. She states her mother fell about 1.5 years ago, and initially went for rehab, but then did not try to improve so that she would come home. Until this hospitalization, her mother loved to roll herself around in her wheelchair. She is worried about quality of life now that with her deficits she is unable to do that.   [name] states her mother suffered from life long depression, and did not feel she had a good QOL at baseline. [name] advises that she had disc issues in her back and had chronic pain as well, until her dementia progressed where she "no longer realized she had the pain."    We discussed her diagnosis, prognosis, GOC, EOL wishes disposition and options.  A detailed discussion was had today regarding advanced directives.  Concepts specific to code status, artifical feeding and hydration, IV antibiotics and  rehospitalization were discussed.  The difference between an aggressive medical intervention path and a comfort care path was discussed.  Values and goals of care important to patient and family were attempted to be elicited.  Discussed limitations of medical interventions to prolong quality of life in some situations and discussed the concept of human mortality.  She states she will likely shift to comfort care and hospice facility placement.       SUMMARY OF RECOMMENDATIONS   Will call back tomorrow. Daughter needs to speak with her siblings. Considering hospice.   Prognosis:   < 2 weeks Poor intake, and needs to be awakened to take food. Admitted for CVA. Dementia at baseline.       Primary Diagnoses: Present on Admission:  CVA (cerebral vascular accident) ([place])  Stroke ([place])  Dementia ([place])  Hypothyroidism  Late onset Alzheimer's disease with behavioral disturbance ([place])   I have reviewed the medical record, interviewed the patient and family, and examined the patient. The following aspects are pertinent.  Past Medical History:  Diagnosis Date   Anemia    Anxiety 08/07/2016   Cancer ([place])    colon ca   Common bile duct (  CBD) obstruction 08/01/2016   Overview:  Possible stone noted 07/26/16 - refer to GI. LFTs nml but does have chronic abd pain    Dementia ([place])    Depression 08/07/2016   Dizzy 03/02/2014   Edema 03/02/2014   Gallstone 08/01/2016   GERD (gastroesophageal reflux disease)    Heart disease    Hyperlipidemia, unspecified 08/07/2016   Hypertension 08/07/2016   Hypothyroidism 08/07/2016   Late onset Alzheimer's disease with behavioral disturbance ([place]) 03/02/2015   Osteoporosis    Vitamin D deficiency    Social History   Socioeconomic History   Marital status: Widowed    Spouse name: Not on file   Number of children: Not on file   Years of education: Not on file   Highest education level: Not on file  Occupational History    Not on file  Social Needs   Financial resource strain: Not on file   Food insecurity    Worry: Not on file    Inability: Not on file   Transportation needs    Medical: Not on file    Non-medical: Not on file  Tobacco Use   Smoking status: Never Smoker   Smokeless tobacco: Never Used  Substance and Sexual Activity   Alcohol use: No   Drug use: No   Sexual activity: Not on file  Lifestyle   Physical activity    Days per week: Not on file    Minutes per session: Not on file   Stress: Not on file  Relationships   Social connections    Talks on phone: Not on file    Gets together: Not on file    Attends religious service: Not on file    Active member of club or organization: Not on file    Attends meetings of clubs or organizations: Not on file    Relationship status: Not on file  Other Topics Concern   Not on file  Social History Narrative   Not on file   Family History  Problem Relation Age of Onset   Diabetes Mother    Heart attack Mother    Cancer Sister    Diabetes Sister    Alcohol abuse Brother    Diabetes Brother    Heart attack Brother    Stroke Brother    Scheduled Meds:  aspirin  81 mg Oral Daily   atorvastatin  40 mg Oral q1800   busPIRone  15 mg Oral BID   clopidogrel  75 mg Oral Daily   enoxaparin (LOVENOX) injection  40 mg Subcutaneous Q24H   feeding supplement (ENSURE ENLIVE)  237 mL Oral Q24H   fentaNYL  1 patch Transdermal Q72H   ferrous sulfate  325 mg Oral Q breakfast   levothyroxine  50 mcg Oral QAC breakfast   mirtazapine  30 mg Oral QHS   pantoprazole  40 mg Oral Daily   saccharomyces boulardii  250 mg Oral BID   senna-docusate  1 tablet Oral BID   sertraline  25 mg Oral Daily   vitamin B-12  1,000 mcg Oral Daily   Continuous Infusions: PRN Meds:.acetaminophen **OR** acetaminophen, alum & mag hydroxide-simeth, hydrALAZINE, loperamide, LORazepam, magnesium hydroxide, ondansetron **OR** ondansetron  (ZOFRAN) IV Medications Prior to Admission:  Prior to Admission medications   Medication Sig Start Date End Date Taking? Authorizing Provider  aspirin EC 81 MG EC tablet Take 1 tablet (81 mg total) by mouth daily. 09/30/17  Yes Wieting, Richard, MD  atenolol (TENORMIN) 50 MG tablet Take 50  mg by mouth daily.    Yes [provider]  busPIRone (BUSPAR) 15 MG tablet Take 15 mg by mouth 2 (two) times daily. 05/29/19  Yes [provider]  docusate sodium (COLACE) 100 MG capsule Take 1 capsule (100 mg total) by mouth 2 (two) times daily. 09/29/17  Yes Wieting, Richard, MD  donepezil (ARICEPT) 5 MG tablet Take 5 mg by mouth every morning.    Yes [provider]  fentaNYL (DURAGESIC - DOSED MCG/HR) 12 MCG/HR Place 1 patch (12.5 mcg total) onto the skin every 3 (three) days. 09/29/17  Yes Wieting, Richard, MD  ferrous sulfate 325 (65 FE) MG tablet Take 325 mg by mouth daily with breakfast.   Yes [provider]  levothyroxine (SYNTHROID, LEVOTHROID) 50 MCG tablet Take 50 mcg by mouth daily before breakfast.   Yes [provider]  LORazepam (ATIVAN) 0.5 MG tablet Take 0.5 mg by mouth daily as needed for anxiety.   Yes [provider]  mirtazapine (REMERON) 30 MG tablet Take 30 mg by mouth at bedtime.    Yes [provider]  pantoprazole (PROTONIX) 40 MG tablet Take 40 mg by mouth daily.   Yes [provider]  saccharomyces boulardii (FLORASTOR) 250 MG capsule Take 250 mg by mouth 2 (two) times daily.   Yes [provider]  sertraline (ZOLOFT) 25 MG tablet Take 25 mg by mouth daily.   Yes [provider]  vitamin B-12 (CYANOCOBALAMIN) 1000 MCG tablet Take 1,000 mcg by mouth daily.   Yes [provider]  acetaminophen (TYLENOL) 325 MG tablet Take 650 mg by mouth every 4 (four) hours as needed for mild pain or moderate pain.    [provider]  acetaminophen (TYLENOL) 500 MG tablet Take 500 mg by mouth every 6  (six) hours as needed for mild pain, fever or headache.    [provider]  aluminum-magnesium hydroxide-simethicone (MAALOX) [id number] MG/5ML SUSP Take 30 mLs by mouth every 6 (six) hours as needed (for heartburn and indigestion).    [provider]  loperamide (IMODIUM) 2 MG capsule Take 2 mg by mouth as needed for diarrhea or loose stools.    [provider]  magnesium hydroxide (MILK OF MAGNESIA) 400 MG/5ML suspension Take 30 mLs by mouth at bedtime as needed for mild constipation or moderate constipation.    [provider]  neomycin-bacitracin-polymyxin (NEOSPORIN) [phone] ointment Apply 1 application topically as needed (for wound care).    [provider]   No Known Allergies Review of Systems  All other systems reviewed and are negative.   Physical Exam Pulmonary:     Effort: Pulmonary effort is normal.  Skin:    General: Skin is warm and dry.  Neurological:     Mental Status: She is alert.     Vital Signs: BP (!) 156/60 (BP Location: Right Arm)    Pulse 62    Temp 97.8 F (36.6 C) (Oral)    Resp 16    Ht 5\' 5"  (1.651 m)    Wt 68 kg    SpO2 99%    BMI 24.96 kg/m  Pain Scale: 0-10 POSS *See Group Information*: S-Acceptable,Sleep, easy to arouse Pain Score: 0-No pain   SpO2: SpO2: 99 % O2 Device:SpO2: 99 % O2 Flow Rate: .   IO: Intake/output summary: No intake or output data in the 24 hours ending 06/30/19 1352  LBM: Last BM Date: 06/26/19 Baseline Weight: Weight: 68 kg Most recent weight: Weight: 68 kg  Palliative Assessment/Data:     Time In: 1:25 Time Out: 2:35 Time Total: 70 min Greater than 50%  of this time was spent counseling and coordinating care related to the above assessment and plan.  Signed by: Asencion Gowda, NP   Please contact Palliative Medicine Team phone at [phone] for questions and concerns.  For individual provider: See Shea Evans

## 2019-06-30 NOTE — Progress Notes (Signed)
Physical Therapy Treatment Patient Details Name: Kerri Rangel MRN: [id number] DOB: September 25, 1923 Today's Date: 06/30/2019    History of Present Illness pt is a 85 yo female admitted for stroke work up after presenting to ED from [place] with left arm weakness, head CT showed possible sided parietal infarct. PMH: colon cancer, anxiety, depression, dementia, HTN, HLD, hypothyroidism    PT Comments    Pt in bed, awake initially but fell quickly asleep after UE ex.  Participated in exercises as described below. LUE remains painful to touch and movement.  Wash cloth roll placed in palm for positioning and comfort with arm elevated on pillows.  She did participate more during session today but fatigues quickly with tasks.   Follow Up Recommendations  SNF     Equipment Recommendations  Other (comment)    Recommendations for Other Services       Precautions / Restrictions Precautions Precaution Comments: painful L UE with even minimal movement, LE pain improved Restrictions Weight Bearing Restrictions: No    Mobility  Bed Mobility      General bed mobility comments: deferred due to fatigue      Balance Overall balance assessment: Needs assistance;History of Falls     Sitting balance - Comments: deferred- pt fatigued with just supine ex's.                                    Cognition Arousal/Alertness: Awake/alert Behavior During Therapy: WFL for tasks assessed/performed Overall Cognitive Status: No family/caregiver present to determine baseline cognitive functioning                                        Exercises Other Exercises Other Exercises: BLE AAROM for ankle pumps, heel slides, SLR and ab/add Other Exercises: LUE - PROM and wrist and elbow - shoulder too painful    General Comments        Pertinent Vitals/Pain Pain Assessment: Faces Faces Pain Scale: Hurts even more Pain Location: L UE Pain Descriptors / Indicators:  Sore Pain Intervention(s): Limited activity within patient's tolerance;Monitored during session;Repositioned    Home Living                      Prior Function            PT Goals (current goals can now be found in the care plan section) Progress towards PT goals: Progressing toward goals    Frequency    7X/week      PT Plan Current plan remains appropriate    Co-evaluation              AM-PAC PT "6 Clicks" Mobility   Outcome Measure  Help needed turning from your back to your side while in a flat bed without using bedrails?: Total Help needed moving from lying on your back to sitting on the side of a flat bed without using bedrails?: Total Help needed moving to and from a bed to a chair (including a wheelchair)?: Total   Help needed to walk in hospital room?: Total Help needed climbing 3-5 steps with a railing? : Total 6 Click Score: 5    End of Session   Activity Tolerance: Patient tolerated treatment well;Patient limited by fatigue Patient left: in bed;with call bell/phone within reach;with bed alarm set   Pain -  Right/Left: Left Pain - part of body: Leg;Arm;Hand     Time: [id number] PT Time Calculation (min) (ACUTE ONLY): 10 min  Charges:  $Therapeutic Exercise: 8-22 mins                    Chesley Noon, PTA 06/30/19, 10:28 AM

## 2019-06-30 NOTE — Progress Notes (Signed)
PROGRESS NOTE  Kerri Rangel [id number] DOB: 1924/05/04 DOA: 06/22/2019 PCP: [facility]  Brief History   85 year old woman PMH colon cancer, dementia, anxiety, depression, hypothyroidism presented with left upper extremity weakness.  Had recently been seen in the ED for superficial skin tear left side.  CT head suggested possible right parietal infarct.  MRI showed large acute posterior right MCA territory infarct without hemorrhage.  Neurology with recommendations for aspirin and Plavix for 3 weeks then Plavix alone.  SNF recommended but insurance company denied rehab.  10/31 attending physician discussed peer to peer and insurance authorization denial was upheld.  Oral intakes remain poor and participation is limited.  Palliative medicine involved.  Family considering hospice versus private pay at SNF.  A & P  Large acute posterior right MCA infarct without hemorrhage.  Etiology likely embolic.  On aspirin prior to admission.  Carotid artery ultrasound no significant ICA stenosis but revealed incomplete steal in right vertebral artery which suggests stenosis proximal subclavian or brachiocephalic arteries.  Echocardiogram showed no cardiac source of emboli.  LDL 111.  A1c 4.9.  Seen by neurology, with recommendation for aspirin and Plavix. --Continue aspirin, Plavix for 3 weeks then continue Plavix alone.  Continue statin. --Continue dysphagia 1 diet with thin liquids but oral intake very poor --SNF per physical therapy but this was denied by [employer].  -- Palliative medicine consulted.  Hospice being considered by family.  Left wrist pain status post fall prior to admission --Repeat imaging this admission was negative for fracture or acute abnormality in the left wrist.  Extensive degenerative changes were seen.  Left humerus film was negative.  Left hand film showed extensive degenerative changes.  No acute abnormality.  Left forearm film was negative. --Exam  again today is unremarkable.  Patient has severe degenerative changes in the wrist joint.  Essential hypertension --Blood pressure stable.  Heart rate stable.  Would not resume atenolol or Aricept given borderline bradycardia off these agents.  Sinus bradycardia --Atenolol held.  Donepezil discontinued.  Hypothyroidism --Continue levothyroxine  Asymptomatic bacteriuria.  Urine culture noted.  Given lack of fever or symptoms, no treatment is indicated. --No treatment indicated.  PMH colon cancer  Late onset Alzheimer's dementia with behavioral disturbance --Appears stable.  She does awaken to voice and answer simple questions.  With bradycardia stopped donezepil. --Mostly bedbound since hip fracture last year per chart  Circumferential wall thickening of the upper esophagus likely due to reflux esophagitis --No further evaluation suggested  Palliative care consultation appreciated.  Family considering hospice..  DVT prophylaxis: enoxaparin Code Status: DNR Family Communication: none Disposition Plan: SNF    Murray Hodgkins, MD  [facility] Direct contact: see [email or web address] (further directions at bottom of note if needed) 7PM-7AM contact night coverage as at bottom of note 06/30/2019, 2:53 PM  LOS: 7 days   Significant Hospital Events   . 10/26 admitted for left-sided weakness, possible stroke. Marland Kitchen 10/27 neurology consultation . 10/31 peer-to-peer conducted, insurance company denied rehab   Consults:  . Neurology   Procedures:  .   Significant Diagnostic Tests:  . CT head AC attenuation right posterior parietal region likely acute to subacute worse infarction.  No hemorrhage. . CT of her cervical spine, no acute cervical spine injury. . Imaging of left humerus, forearm, wrist and hand was negative for acute abnormalities . Chest x-ray no acute disease . MRI brain large acute posterior right MCA infarct without hemorrhage or mass-effect. . Carotid ultrasound: Mild  stenosis right ICA,  mild stenosis left ICA.  Incomplete steal right vertebral artery suggest stenosis in more proximal subclavian or brachiocephalic arteries. . Left upper extremity venous duplex no evidence of DVT.   Micro Data:  .    Antimicrobials:     Diet recommendations: Dysphagia 1 (puree);Thin liquid Liquids provided via: Cup Medication Administration: Crushed with puree(for safer swallowing) Supervision: Staff to assist with self feeding;Intermittent supervision to cue for compensatory strategies Compensations: Minimize environmental distractions;Slow rate;Small sips/bites;Lingual sweep for clearance of pocketing;Multiple dry swallows after each bite/sip;Follow solids with liquid Postural Changes and/or Swallow Maneuvers: Seated upright 90 degrees;Upright 30-60 min after meal  Interval History/Subjective  No new issues today.  Spends most of her time sleeping per RN.  Very poor oral intake.  Objective   Vitals:  Vitals:   06/30/19 0839 06/30/19 1300  BP: (!) 163/66 (!) 156/60  Pulse: (!) 54 62  Resp: 17 16  Temp: (!) 97.5 F (36.4 C) 97.8 F (36.6 C)  SpO2: 99% 99%    Exam: Constitutional.  Appears calm, comfortable. Respiratory.  Clear to auscultation bilaterally.  No wheezes, rales or rhonchi.  Normal respiratory effort. Cardiovascular.  Regular rate and rhythm.  No murmur, rub or gallop. Left arm.  Left wrist continues to have decreased swelling.  There is no pain with manipulation of the joint or arm. Psychiatric.  Arouses to voice.  Answers some simple questions but appears confused. Musculoskeletal.  Weakness on the left side noted.   I have personally reviewed the following:   Today's Data  . No new data  Scheduled Meds: . aspirin  81 mg Oral Daily  . atorvastatin  40 mg Oral q1800  . busPIRone  15 mg Oral BID  . clopidogrel  75 mg Oral Daily  . enoxaparin (LOVENOX) injection  40 mg Subcutaneous Q24H  . feeding supplement (ENSURE ENLIVE)  237 mL  Oral Q24H  . fentaNYL  1 patch Transdermal Q72H  . ferrous sulfate  325 mg Oral Q breakfast  . levothyroxine  50 mcg Oral QAC breakfast  . mirtazapine  30 mg Oral QHS  . pantoprazole  40 mg Oral Daily  . saccharomyces boulardii  250 mg Oral BID  . senna-docusate  1 tablet Oral BID  . sertraline  25 mg Oral Daily  . vitamin B-12  1,000 mcg Oral Daily   Continuous Infusions:  Principal Problem:   CVA (cerebral vascular accident) ([place]) Active Problems:   Hypothyroidism   Late onset Alzheimer's disease with behavioral disturbance ([place])   Dementia ([place])   Stroke ([place])   Essential hypertension   LOS: 7 days   How to contact the [facility] Attending or Consulting provider [place] or covering provider during after hours [place], for this patient?  1. Check the care team in [facility] and look for a) attending/consulting [facility] provider listed and b) the [facility] team listed 2. Log into [email or web address] and use [place]'s universal password to access. If you do not have the password, please contact the hospital operator. 3. Locate the [facility] provider you are looking for under [facility] and page to a number that you can be directly reached. 4. If you still have difficulty reaching the provider, please page the [facility] (Director on Call) for the Hospitalists listed on amion for assistance.

## 2019-06-30 NOTE — Progress Notes (Signed)
OT Cancellation Note  Patient Details Name: Kerri Rangel MRN: [id number] DOB: 07-27-1924   Cancelled Treatment:    Reason Eval/Treat Not Completed: Fatigue/lethargy limiting ability to participate. OT attempted to see this pt for treatment this date. Upon arrival to pt room, pt sleeping soundly in bed. Does not wake to verbal cues. Pt opens eyes upon gentle sternal rub, but does not maintain. Will re-attempt at a later time/date as available and pt able to participate in OT tx session.  Shara Blazing, M.S., OTR/L Ascom: [phone] 06/30/19, 10:35 AM

## 2019-07-01 NOTE — Progress Notes (Signed)
Occupational Therapy Treatment Patient Details Name: Kerri Rangel MRN: [id number] DOB: 07/02/1924 Today's Date: 07/01/2019    History of present illness pt is a 85 yo female admitted for stroke work up after presenting to ED from [place] with left arm weakness, head CT showed possible sided parietal infarct. PMH: colon cancer, anxiety, depression, dementia, HTN, HLD, hypothyroidism   OT comments  Kerri Rangel was seen for OT treatment on this date. Upon arrival to room pt supine in bed, appeared to be sleeping however wakes easily to verbal cues. OT engages pt in BUE PROM exercises described below. Pt tolerates well with notably decreased pain this date, however, she does still endorse pain with active wrist and hand movements of her LUE. Exercises limited to ensure pt comfort. OT positioned pt LUE on pillows with a rolled towel in hand to encourage neutral positioning, minimize swelling, and maximize skin integrity this date. OT attempts to engage pt in face washing task as ADL component of session, however pt states "no no" when prompted to use her RUE to bring the washcloth up to her face. Pt is able to briefly bring the washcloth up to her chin, but fatigues quickly and is unable to participate further. OT provides max assist for face washing after this attempt. Pt progressing toward goals and continues to benefit from skilled OT services to maximize return to PLOF and minimize risk of future falls, injury, caregiver burden, and readmission. Will continue to follow POC. Discharge recommendation remains appropriate.      Follow Up Recommendations  SNF    Equipment Recommendations       Recommendations for Other Services      Precautions / Restrictions Precautions Precautions: Fall Precaution Comments: painful L UE with even minimal movement, LE pain improved Restrictions Weight Bearing Restrictions: No       Mobility Bed Mobility Overal bed mobility: Needs Assistance Bed Mobility:  Rolling Rolling: Max assist   Supine to sit: Mod assist;Max assist;+2 for physical assistance Sit to supine: Max assist;+2 for physical assistance      Transfers                      Balance Overall balance assessment: Needs assistance;History of Falls     Sitting balance - Comments: deferred- pt fatigued with just supine ex's.                                   ADL either performed or assessed with clinical judgement   ADL         Grooming Details (indicate cue type and reason): OT attempts to engage pt in face washing using her RUE this date. Pt says "no, no" when asked if she can bring the washcloth up to her face. With gentle physical assistance pt brings washcloth to her neck/chin. OT provides max assist to complete this task.                                     Vision       Perception     Praxis      Cognition Arousal/Alertness: Lethargic Behavior During Therapy: WFL for tasks assessed/performed Overall Cognitive Status: No family/caregiver present to determine baseline cognitive functioning  Exercises  Shoulder Flexion: PROM;5 reps;Both Forearm Supination: PROM;Both;5 reps Forearm Pronation: PROM;Both;5 reps Composite Extension: PROM;AROM;5 reps;Both(PROM on LUE-AROM on RUE) Shoulder Flexion: PROM;10 reps;Both Elbow Flexion: Both;PROM;10 reps;AROM(PROM LUE/AROM RUE)  Other Exercises: Pt engaged in PROM exercises listed above with LUE re-positioned on pillows with a rolled washcloth in her hand to maintain neutral positioning, minimize swelling, and maximize skin integrity this date. OT attempts to engage pt in ADL task, but pt states "no no" when attempted. OT completes face washing with max assist.   Shoulder Instructions       General Comments      Pertinent Vitals/ Pain       Pain Assessment: Faces Faces Pain Scale: Hurts little more Pain Location: L UE  - pt groans and cries out with minimal LUE movement. Pt recieved with LUE tucked under side of body this date. Ot repositions for comfort after gentle PROM. Pain Descriptors / Indicators: Grimacing;Guarding Pain Intervention(s): Limited activity within patient's tolerance;Monitored during session;Repositioned  Home Living                                          Prior Functioning/Environment              Frequency  Min 2X/week        Progress Toward Goals  OT Goals(current goals can now be found in the care plan section)     Acute Rehab OT Goals Patient Stated Goal: Pt unable to state OT Goal Formulation: Patient unable to participate in goal setting Time For Goal Achievement: 07/08/19  Plan Discharge plan remains appropriate;Frequency remains appropriate    Co-evaluation                 AM-PAC OT "6 Clicks" Daily Activity     Outcome Measure   Help from another person eating meals?: A Lot Help from another person taking care of personal grooming?: A Lot Help from another person toileting, which includes using toliet, bedpan, or urinal?: Total Help from another person bathing (including washing, rinsing, drying)?: Total Help from another person to put on and taking off regular upper body clothing?: A Lot Help from another person to put on and taking off regular lower body clothing?: A Lot 6 Click Score: 10    End of Session    OT Visit Diagnosis: Other abnormalities of gait and mobility (R26.89);Pain;History of falling (Z91.81);Hemiplegia and hemiparesis Hemiplegia - Right/Left: Left Hemiplegia - dominant/non-dominant: Dominant Hemiplegia - caused by: Cerebral infarction Pain - Right/Left: Left Pain - part of body: Arm;Hand   Activity Tolerance Patient limited by lethargy   Patient Left in bed;with bed alarm set;with call bell/phone within reach   Nurse Communication Other (comment)(Pt noted to have had BM in bed. NT notified.)         Time: 0920-0939 OT Time Calculation (min): 19 min  Charges: OT General Charges $OT Visit: 1 Visit OT Treatments $Therapeutic Exercise: 8-22 mins  Shara Blazing, M.S., OTR/L Ascom: [phone] 07/01/19, 11:18 AM

## 2019-07-01 NOTE — TOC Progression Note (Signed)
Transition of Care [facility]) - Progression Note    Patient Details  Name: CELINDA STIEN MRN: [id number] Date of Birth: 07/25/1924  [facility] CM/SW Contact  Shelbie Hutching, RN Phone Number: 07/01/2019, 4:15 PM  Clinical Narrative:    Palliative medicine following.  Patient's family was undecided yesterday about hospice.  Patient's daughter [name] would still like to give her mother a chance with rehab and has considered private paying for rehab, but she is conflicted on if this is the right thing to do.  Patient's daughter reports that she has not spoken with palliative care today.  RNCM will call and follow up with daughter tomorrow.  Patient's son would like for the patient to go to hospice.    Expected Discharge Plan: [place] Barriers to Discharge: Continued Medical Work up  Expected Discharge Plan and Services Expected Discharge Plan: [place]   Discharge Planning Services: CM Consult   Living arrangements for the past 2 months: Assisted Living Facility(Memory Care)                                       Social Determinants of Health (SDOH) Interventions    Readmission Risk Interventions No flowsheet data found.

## 2019-07-01 NOTE — Progress Notes (Signed)
PT Cancellation Note  Patient Details Name: Kerri Rangel MRN: [id number] DOB: 1923/10/01   Cancelled Treatment:    Reason Eval/Treat Not Completed: Patient declined, no reason specified;Fatigue/lethargy limiting ability to participate. Treatment attempted; pt sleeping, but awakens enough to respond with head nod "no" to participating with PT for gentle exercises. Spoke with nursing post attempt. Pt has had limited response with all staff. Re attempt as appropriate.    Larae Grooms, PTA 07/01/2019, 4:11 PM

## 2019-07-01 NOTE — Progress Notes (Signed)
Nutrition Follow-up  DOCUMENTATION CODES:   Not applicable  INTERVENTION:  Continue Ensure Enlive po once daily, each supplement provides 350 kcal and 20 grams of protein.  NUTRITION DIAGNOSIS:   Biting/chewing difficulty related to (loose fitting dentures) as evidenced by (per chart, need for dysphagia 1 diet.).  Ongoing.  GOAL:   Patient will meet greater than or equal to 90% of their needs  Not met.  MONITOR:   PO intake, Supplement acceptance, Labs, Weight trends, Skin, I & O's  REASON FOR ASSESSMENT:   Malnutrition Screening Tool    ASSESSMENT:   85 year old female with PMHx of HTN, hypothyroidism, Alzheimer's disease, anxiety, depression, HLD, vitamin D deficiency, GERD admitted with CVA.  Patient's PO intake has declined. She is now only eating 0-30% of her meals. Family is discussing goals of care and deciding on hospice versus private pay at SNF. Will monitor for outcome of discussions regarding goals of care.  Medications reviewed and include: fentanyl patch, ferrous sulfate 325 mg daily, levothyroxine, Remeron 30 mg QHS, pantoprazole, senna-docusate 1 tablet BID, sertraline, vitamin B12 1000 micrograms daily.  Labs reviewed: Chloride 116.  Diet Order:   Diet Order            DIET - DYS 1 Room service appropriate? Yes with Assist; Fluid consistency: Thin  Diet effective now             EDUCATION NEEDS:   No education needs have been identified at this time  Skin:  Skin Assessment: Skin Integrity Issues:(MSAD to perineum; ecchymosis)  Last BM:  07/01/2019 - medium type 7  Height:   Ht Readings from Last 1 Encounters:  06/22/19 5' 5" (1.651 m)   Weight:   Wt Readings from Last 1 Encounters:  06/22/19 68 kg   Ideal Body Weight:  56.8 kg  BMI:  Body mass index is 24.96 kg/m.  Estimated Nutritional Needs:   Kcal:  1400-1600  Protein:  70-80 grams  Fluid:  1.4-1.6 L/day  Jacklynn Barnacle, MS, RD, LDN Office: [phone] Pager:  [phone] After Hours/Weekend Pager: [phone]

## 2019-07-01 NOTE — Care Management Important Message (Signed)
Important Message  Patient Details  Name: Kerri Rangel MRN: [id number] Date of Birth: 04-12-1924   Medicare Important Message Given:  Yes     Juliann Pulse A Shaneque Merkle 07/01/2019, 11:23 AM

## 2019-07-01 NOTE — Progress Notes (Signed)
PROGRESS NOTE  Kerri Rangel JYN:[id number] DOB: [birth year] DOA: 06/22/2019 PCP: [facility]  HPI/Recap of past 24 hours: 85 year old woman PMH colon cancer, dementia, anxiety, depression, hypothyroidism presented with left upper extremity weakness.  Had recently been seen in the ED for superficial skin tear left side.  CT head suggested possible right parietal infarct.  MRI showed large acute posterior right MCA territory infarct without hemorrhage.  Neurology with recommendations for aspirin and Plavix for 3 weeks then Plavix alone.  SNF recommended but insurance company denied rehab.  10/31 attending physician discussed peer to peer and insurance authorization denial was upheld.  Oral intakes remain poor and participation is limited.  Palliative medicine involved.  Family considering hospice versus private pay at SNF.   Assessment/Plan: Large acute posterior right MCA infarct without hemorrhage.  Etiology likely embolic.  On aspirin prior to admission.  Carotid artery ultrasound no significant ICA stenosis but revealed incomplete steal in right vertebral artery which suggests stenosis proximal subclavian or brachiocephalic arteries.  Echocardiogram showed no cardiac source of emboli.  LDL 111.  A1c 4.9.  Seen by neurology, with recommendation for aspirin and Plavix. --Continue aspirin, Plavix for 3 weeks then continue Plavix alone.  Continue statin. --Continue dysphagia 1 diet with thin liquids but oral intake very poor Met with palliative care, life expectation is less than 2 weeks given pretty much absence of p.o. intake now at this point.  Palliative care working with family looking at hospice  Left wrist pain status post fall prior to admission --Repeat imaging this admission was negative for fracture or acute abnormality in the left wrist.  Extensive degenerative changes were seen.  Left humerus film was negative.  Left hand film showed extensive degenerative changes.  No  acute abnormality.  Left forearm film was negative.  Essential hypertension --Blood pressure stable.  Heart rate stable.  Would not resume atenolol or Aricept given borderline bradycardia off these agents.  Sinus bradycardia --Atenolol held.  Donepezil discontinued.  Hypothyroidism --Continue levothyroxine  Asymptomatic bacteriuria.  Urine culture noted.  Given lack of fever or symptoms, no treatment is indicated. --No treatment indicated.  PMH colon cancer  Late onset Alzheimer's dementia with behavioral disturbance --Appears stable.  She does awaken to voice and answer simple questions.  With bradycardia stopped donezepil. --Mostly bedbound since hip fracture last year per chart  Circumferential wall thickening of the upper esophagus likely due to reflux esophagitis --No further evaluation suggested  Code Status: DNR  Family Communication: Left message with daughter  Disposition Plan: Potential discharge to hospice facility if accepted   Consultants:  Hospice and palliative care  Neurology  Procedures:  Carotid ultrasound: Mild stenosis right ICA, mild stenosis left ICA.  Incomplete steal right vertebral artery suggest stenosis in more proximal subclavian or brachiocephalic arteries.  Antimicrobials:  None  DVT prophylaxis: Lovenox   Objective: Vitals:   07/01/19 0800 07/01/19 1413  BP: (!) 160/61 (!) 156/58  Pulse: 71 62  Resp: 18 20  Temp: 98.3 F (36.8 C) 98.1 F (36.7 C)  SpO2: 100% 100%    Intake/Output Summary (Last 24 hours) at 07/01/2019 1502 Last data filed at 07/01/2019 1400 Gross per 24 hour  Intake 295 ml  Output -  Net 295 ml   Filed Weights   06/22/19 1310  Weight: 68 kg   Body mass index is 24.96 kg/m.  Exam:   General: Resting comfortably, no acute distress  Cardiovascular: Regular rate and rhythm  Respiratory: Clear to auscultation bilaterally  Abdomen: Soft, nontender, nondistended, positive bowel sounds   Musculoskeletal: Trace pitting edema    Data Reviewed: CBC: Recent Labs  Lab 06/27/19 0623  WBC 10.2  HGB 11.1*  HCT 34.7*  MCV 94.3  PLT 503   Basic Metabolic Panel: Recent Labs  Lab 06/25/19 0401 06/27/19 0623 06/29/19 0510  NA 144 142  --   K 4.1 3.6  --   CL 117* 116*  --   CO2 20* 23  --   GLUCOSE 117* 108*  --   BUN 28* 19  --   CREATININE 0.74 0.60 0.81  CALCIUM 9.6 10.3  --   MG 2.1 1.8  --    GFR: Estimated Creatinine Clearance: 39 mL/min (by C-G formula based on SCr of 0.81 mg/dL). Liver Function Tests: No results for input(s): AST, ALT, ALKPHOS, BILITOT, PROT, ALBUMIN in the last 168 hours. No results for input(s): LIPASE, AMYLASE in the last 168 hours. No results for input(s): AMMONIA in the last 168 hours. Coagulation Profile: No results for input(s): INR, PROTIME in the last 168 hours. Cardiac Enzymes: No results for input(s): CKTOTAL, CKMB, CKMBINDEX, TROPONINI in the last 168 hours. BNP (last 3 results) No results for input(s): PROBNP in the last 8760 hours. HbA1C: No results for input(s): HGBA1C in the last 72 hours. CBG: No results for input(s): GLUCAP in the last 168 hours. Lipid Profile: No results for input(s): CHOL, HDL, LDLCALC, TRIG, CHOLHDL, LDLDIRECT in the last 72 hours. Thyroid Function Tests: No results for input(s): TSH, T4TOTAL, FREET4, T3FREE, THYROIDAB in the last 72 hours. Anemia Panel: No results for input(s): VITAMINB12, FOLATE, FERRITIN, TIBC, IRON, RETICCTPCT in the last 72 hours. Urine analysis:    Component Value Date/Time   COLORURINE AMBER (A) 06/23/2019 0539   APPEARANCEUR CLOUDY (A) 06/23/2019 0539   APPEARANCEUR Clear 12/23/2014 1349   LABSPEC 1.023 06/23/2019 0539   LABSPEC 1.009 12/23/2014 1349   PHURINE 6.0 06/23/2019 0539   GLUCOSEU NEGATIVE 06/23/2019 0539   GLUCOSEU Negative 12/23/2014 1349   HGBUR NEGATIVE 06/23/2019 0539   BILIRUBINUR NEGATIVE 06/23/2019 0539   BILIRUBINUR Negative 12/23/2014 1349    KETONESUR NEGATIVE 06/23/2019 0539   PROTEINUR 100 (A) 06/23/2019 0539   NITRITE POSITIVE (A) 06/23/2019 0539   LEUKOCYTESUR MODERATE (A) 06/23/2019 0539   LEUKOCYTESUR Trace 12/23/2014 1349   Sepsis Labs: '@LABRCNTIP'$ (procalcitonin:4,lacticidven:4)  ) Recent Results (from the past 240 hour(s))  SARS CORONAVIRUS 2 (TAT 6-24 HRS) Nasopharyngeal Nasopharyngeal Swab     Status: None   Collection Time: 06/22/19  5:29 PM   Specimen: Nasopharyngeal Swab  Result Value Ref Range Status   SARS Coronavirus 2 NEGATIVE NEGATIVE Final    Comment: (NOTE) SARS-CoV-2 target nucleic acids are NOT DETECTED. The SARS-CoV-2 RNA is generally detectable in upper and lower respiratory specimens during the acute phase of infection. Negative results do not preclude SARS-CoV-2 infection, do not rule out co-infections with other pathogens, and should not be used as the sole basis for treatment or other patient management decisions. Negative results must be combined with clinical observations, patient history, and epidemiological information. The expected result is Negative. Fact Sheet for Patients: SugarRoll.be Fact Sheet for Healthcare Providers: https://www.woods-mathews.com/ This test is not yet approved or cleared by the [place] FDA and  has been authorized for detection and/or diagnosis of SARS-CoV-2 by FDA under an Emergency Use Authorization (EUA). This EUA will remain  in effect (meaning this test can be used) for the duration of the COVID-19 declaration under Section 56 4(b)(1)  of the Act, 21 U.S.C. section 360bbb-3(b)(1), unless the authorization is terminated or revoked sooner. Performed at [facility], [place] [address], [place], [place] [address]   MRSA PCR Screening     Status: None   Collection Time: 06/23/19 12:05 AM   Specimen: Nasopharyngeal  Result Value Ref Range Status   MRSA by PCR NEGATIVE NEGATIVE Final    Comment:         The GeneXpert MRSA Assay (FDA approved for NASAL specimens only), is one component of a comprehensive MRSA colonization surveillance program. It is not intended to diagnose MRSA infection nor to guide or monitor treatment for MRSA infections. Performed at [facility], [address] [place], [place] [address]   Urine Culture     Status: Abnormal   Collection Time: 06/23/19  5:40 AM   Specimen: Urine, Random  Result Value Ref Range Status   Specimen Description   Final    URINE, RANDOM Performed at [facility], [address] [place], [place] [address]    Special Requests   Final    NONE Performed at [facility], [place], [place] [address]    Culture (A)  Final    80,000 COLONIES/mL ESCHERICHIA COLI >=100,000 COLONIES/mL AEROCOCCUS URINAE Standardized susceptibility testing for this organism is not available. Performed at [facility], [place] [address], [place], [place] [address]    Report Status 06/27/2019 FINAL  Final   Organism ID, Bacteria ESCHERICHIA COLI (A)  Final      Susceptibility   Escherichia coli - MIC*    AMPICILLIN >=32 RESISTANT Resistant     CEFAZOLIN >=64 RESISTANT Resistant     CEFTRIAXONE <=1 SENSITIVE Sensitive     CIPROFLOXACIN >=4 RESISTANT Resistant     GENTAMICIN <=1 SENSITIVE Sensitive     IMIPENEM <=0.25 SENSITIVE Sensitive     NITROFURANTOIN <=16 SENSITIVE Sensitive     TRIMETH/SULFA >=320 RESISTANT Resistant     AMPICILLIN/SULBACTAM >=32 RESISTANT Resistant     PIP/TAZO 64 INTERMEDIATE Intermediate     Extended ESBL NEGATIVE Sensitive     * 80,000 COLONIES/mL ESCHERICHIA COLI  SARS CORONAVIRUS 2 (TAT 6-24 HRS) Nasopharyngeal Nasopharyngeal Swab     Status: None   Collection Time: 06/26/19  1:49 PM   Specimen: Nasopharyngeal Swab  Result Value Ref Range Status   SARS Coronavirus 2 NEGATIVE NEGATIVE Final    Comment: (NOTE) SARS-CoV-2 target nucleic acids are NOT DETECTED. The  SARS-CoV-2 RNA is generally detectable in upper and lower respiratory specimens during the acute phase of infection. Negative results do not preclude SARS-CoV-2 infection, do not rule out co-infections with other pathogens, and should not be used as the sole basis for treatment or other patient management decisions. Negative results must be combined with clinical observations, patient history, and epidemiological information. The expected result is Negative. Fact Sheet for Patients: SugarRoll.be Fact Sheet for Healthcare Providers: https://www.woods-mathews.com/ This test is not yet approved or cleared by the [place] FDA and  has been authorized for detection and/or diagnosis of SARS-CoV-2 by FDA under an Emergency Use Authorization (EUA). This EUA will remain  in effect (meaning this test can be used) for the duration of the COVID-19 declaration under Section 56 4(b)(1) of the Act, 21 U.S.C. section 360bbb-3(b)(1), unless the authorization is terminated or revoked sooner. Performed at [facility], [address], [place], [place] [address]       Studies: No results found.  Scheduled Meds: .  aspirin  81 mg Oral Daily  . atorvastatin  40 mg Oral q1800  . busPIRone  15 mg Oral BID  . clopidogrel  75 mg Oral Daily  . enoxaparin (LOVENOX) injection  40 mg Subcutaneous Q24H  . feeding supplement (ENSURE ENLIVE)  237 mL Oral Q24H  . fentaNYL  1 patch Transdermal Q72H  . ferrous sulfate  325 mg Oral Q breakfast  . levothyroxine  50 mcg Oral QAC breakfast  . mirtazapine  30 mg Oral QHS  . pantoprazole  40 mg Oral Daily  . saccharomyces boulardii  250 mg Oral BID  . senna-docusate  1 tablet Oral BID  . sertraline  25 mg Oral Daily  . vitamin B-12  1,000 mcg Oral Daily    Continuous Infusions:   LOS: 8 days     Annita Brod, MD [facility]  To reach me or the doctor on call, go to: [email or web address] Password  [email or web address]  07/01/2019, 3:02 PM

## 2019-07-02 NOTE — Progress Notes (Signed)
Went to assess patient.  While she does grimace to pain, she is otherwise non-responsive / somnolent.  Notified attending of this finding.  Will be holding all PO meds d/t potential for aspiration.

## 2019-07-02 NOTE — Progress Notes (Signed)
PT Cancellation Note  Patient Details Name: Kerri Rangel MRN: [id number] DOB: [birth year]   Cancelled Treatment:    Reason Eval/Treat Not Completed: Fatigue/lethargy limiting ability to participate   Pt lethargic and did not awake to voice or gentle stim.  Will continue as appropriate.   Chesley Noon 07/02/2019, 11:50 AM

## 2019-07-02 NOTE — TOC Progression Note (Signed)
Transition of Care [facility]) - Progression Note    Patient Details  Name: Kerri Rangel MRN: [id number] Date of Birth: 1923/12/25  Transition [facility]) CM/SW Contact  Shelbie Hutching, RN Phone Number: 07/02/2019, 2:49 PM  Clinical Narrative:    Patient's daughter was able to come and visit with patient around lunch time.  Daughter was able to get patient to eat some lunch.  Daughter seems to be leaning toward hospice care.     Expected Discharge Plan: [place] Barriers to Discharge: Continued Medical Work up  Expected Discharge Plan and Services Expected Discharge Plan: [place]   Discharge Planning Services: CM Consult   Living arrangements for the past 2 months: Assisted Living Facility(Memory Care)                                       Social Determinants of Health (SDOH) Interventions    Readmission Risk Interventions No flowsheet data found.

## 2019-07-02 NOTE — TOC Progression Note (Signed)
Transition of Care [facility]) - Progression Note    Patient Details  Name: Kerri Rangel MRN: [id number] Date of Birth: 1924/04/29  Transition of Care [facility]) CM/SW Contact  Shelbie Hutching, RN Phone Number: 07/02/2019, 10:16 AM  Clinical Narrative:     Patient's daughter [name] will come up to the hospital to see the patient today.  [employer] has agreed to approved for a trial period of skilled nursing days, if patient does not progress then patient can transition to hospice care.  Patient can be followed by OP palliative while at SNF for rehab.  RNCM will discuss options further with patient's daughter when she arrives at the hospital.     Expected Discharge Plan: Skilled Nursing Facility Barriers to Discharge: Continued Medical Work up  Expected Discharge Plan and Services Expected Discharge Plan: [place]   Discharge Planning Services: CM Consult   Living arrangements for the past 2 months: Assisted Living Facility(Memory Care)                                       Social Determinants of Health (SDOH) Interventions    Readmission Risk Interventions No flowsheet data found.

## 2019-07-02 NOTE — Progress Notes (Signed)
PROGRESS NOTE  Kerri Rangel YQM:[id number] DOB: 15-Jan-1924 DOA: 06/22/2019 PCP: [facility]  HPI/Recap of past 59 hours: 85 year old woman PMH colon cancer, dementia, anxiety, depression, hypothyroidism presented with left upper extremity weakness.  Had recently been seen in the ED for superficial skin tear left side.  CT head suggested possible right parietal infarct.  MRI showed large acute posterior right MCA territory infarct without hemorrhage.  Neurology with recommendations for aspirin and Plavix for 3 weeks then Plavix alone.  SNF recommended but insurance company denied rehab.  10/31 attending physician discussed peer to peer and insurance authorization denial was upheld.  Oral intakes remain poor and participation is limited.  Palliative medicine involved.    After much discussion, daughter plans to have patient go to skilled nursing intensity rehab.  If she is unsuccessful, she will transition from there to hospice.  Patient herself about the same, withdrawn.  Assessment/Plan: Large acute posterior right MCA infarct without hemorrhage.  Etiology likely embolic.  On aspirin prior to admission.  Carotid artery ultrasound no significant ICA stenosis but revealed incomplete steal in right vertebral artery which suggests stenosis proximal subclavian or brachiocephalic arteries.  Echocardiogram showed no cardiac source of emboli.  LDL 111.  A1c 4.9.  Seen by neurology, with recommendation for aspirin and Plavix. --Continue aspirin, Plavix for 3 weeks then continue Plavix alone.  Continue statin. --Continue dysphagia 1 diet with thin liquids but oral intake very poor Met with palliative care, life expectation is less than 2 weeks given pretty much absence of p.o. intake now at this point.  See above in regards to attempting rehab first.  If not that, transition to hospice  Left wrist pain status post fall prior to admission --Repeat imaging this admission was negative for  fracture or acute abnormality in the left wrist.  Extensive degenerative changes were seen.  Left humerus film was negative.  Left hand film showed extensive degenerative changes.  No acute abnormality.  Left forearm film was negative.  Essential hypertension --Blood pressure stable.  Heart rate stable.  Would not resume atenolol or Aricept given borderline bradycardia off these agents.  Sinus bradycardia --Atenolol held.  Donepezil discontinued.  Hypothyroidism --Continue levothyroxine  Asymptomatic bacteriuria.  Urine culture noted.  Given lack of fever or symptoms, no treatment is indicated. --No treatment indicated.  PMH colon cancer  Late onset Alzheimer's dementia with behavioral disturbance --Appears stable.  She does awaken to voice and answer simple questions.  With bradycardia stopped donezepil. --Mostly bedbound since hip fracture last year per chart  Circumferential wall thickening of the upper esophagus likely due to reflux esophagitis --No further evaluation suggested  Code Status: DNR  Family Communication: Left message with daughter  Disposition Plan: Potential discharge to skilled nursing and if this does not work, hospice   Consultants:  Hospice and palliative care  Neurology  Procedures:  Carotid ultrasound: Mild stenosis right ICA, mild stenosis left ICA.  Incomplete steal right vertebral artery suggest stenosis in more proximal subclavian or brachiocephalic arteries.  Antimicrobials:  None  DVT prophylaxis: Lovenox   Objective: Vitals:   07/01/19 1943 07/02/19 0529  BP: (!) 136/50 (!) 132/43  Pulse: 63 (!) 52  Resp: 18 16  Temp: 98.9 F (37.2 C) 98.3 F (36.8 C)  SpO2: 96% 95%    Intake/Output Summary (Last 24 hours) at 07/02/2019 1229 Last data filed at 07/01/2019 1400 Gross per 24 hour  Intake 175 ml  Output -  Net 175 ml   Filed  Weights   06/22/19 1310  Weight: 68 kg   Body mass index is 24.96 kg/m.  Exam:  Unchanged from previous day  General: Resting comfortably, no acute distress  Cardiovascular: Regular rate and rhythm  Respiratory: Clear to auscultation bilaterally  Abdomen: Soft, nontender, nondistended, positive bowel sounds  Musculoskeletal: Trace pitting edema    Data Reviewed: CBC: Recent Labs  Lab 06/27/19 0623  WBC 10.2  HGB 11.1*  HCT 34.7*  MCV 94.3  PLT 262   Basic Metabolic Panel: Recent Labs  Lab 06/27/19 0623 06/29/19 0510  NA 142  --   K 3.6  --   CL 116*  --   CO2 23  --   GLUCOSE 108*  --   BUN 19  --   CREATININE 0.60 0.81  CALCIUM 10.3  --   MG 1.8  --    GFR: Estimated Creatinine Clearance: 39 mL/min (by C-G formula based on SCr of 0.81 mg/dL). Liver Function Tests: No results for input(s): AST, ALT, ALKPHOS, BILITOT, PROT, ALBUMIN in the last 168 hours. No results for input(s): LIPASE, AMYLASE in the last 168 hours. No results for input(s): AMMONIA in the last 168 hours. Coagulation Profile: No results for input(s): INR, PROTIME in the last 168 hours. Cardiac Enzymes: No results for input(s): CKTOTAL, CKMB, CKMBINDEX, TROPONINI in the last 168 hours. BNP (last 3 results) No results for input(s): PROBNP in the last 8760 hours. HbA1C: No results for input(s): HGBA1C in the last 72 hours. CBG: No results for input(s): GLUCAP in the last 168 hours. Lipid Profile: No results for input(s): CHOL, HDL, LDLCALC, TRIG, CHOLHDL, LDLDIRECT in the last 72 hours. Thyroid Function Tests: No results for input(s): TSH, T4TOTAL, FREET4, T3FREE, THYROIDAB in the last 72 hours. Anemia Panel: No results for input(s): VITAMINB12, FOLATE, FERRITIN, TIBC, IRON, RETICCTPCT in the last 72 hours. Urine analysis:    Component Value Date/Time   COLORURINE AMBER (A) 06/23/2019 0539   APPEARANCEUR CLOUDY (A) 06/23/2019 0539   APPEARANCEUR Clear 12/23/2014 1349   LABSPEC 1.023 06/23/2019 0539   LABSPEC 1.009 12/23/2014 1349   PHURINE 6.0 06/23/2019 0539    GLUCOSEU NEGATIVE 06/23/2019 0539   GLUCOSEU Negative 12/23/2014 1349   HGBUR NEGATIVE 06/23/2019 0539   BILIRUBINUR NEGATIVE 06/23/2019 0539   BILIRUBINUR Negative 12/23/2014 1349   KETONESUR NEGATIVE 06/23/2019 0539   PROTEINUR 100 (A) 06/23/2019 0539   NITRITE POSITIVE (A) 06/23/2019 0539   LEUKOCYTESUR MODERATE (A) 06/23/2019 0539   LEUKOCYTESUR Trace 12/23/2014 1349   Sepsis Labs: '@LABRCNTIP'$ (procalcitonin:4,lacticidven:4)  ) Recent Results (from the past 240 hour(s))  SARS CORONAVIRUS 2 (TAT 6-24 HRS) Nasopharyngeal Nasopharyngeal Swab     Status: None   Collection Time: 06/22/19  5:29 PM   Specimen: Nasopharyngeal Swab  Result Value Ref Range Status   SARS Coronavirus 2 NEGATIVE NEGATIVE Final    Comment: (NOTE) SARS-CoV-2 target nucleic acids are NOT DETECTED. The SARS-CoV-2 RNA is generally detectable in upper and lower respiratory specimens during the acute phase of infection. Negative results do not preclude SARS-CoV-2 infection, do not rule out co-infections with other pathogens, and should not be used as the sole basis for treatment or other patient management decisions. Negative results must be combined with clinical observations, patient history, and epidemiological information. The expected result is Negative. Fact Sheet for Patients: SugarRoll.be Fact Sheet for Healthcare Providers: https://www.woods-mathews.com/ This test is not yet approved or cleared by the [place] FDA and  has been authorized for detection and/or diagnosis of SARS-CoV-2  by FDA under an Emergency Use Authorization (EUA). This EUA will remain  in effect (meaning this test can be used) for the duration of the COVID-19 declaration under Section 56 4(b)(1) of the Act, 21 U.S.C. section 360bbb-3(b)(1), unless the authorization is terminated or revoked sooner. Performed at [facility], [place] [address], [place], [place] [address]   MRSA PCR  Screening     Status: None   Collection Time: 06/23/19 12:05 AM   Specimen: Nasopharyngeal  Result Value Ref Range Status   MRSA by PCR NEGATIVE NEGATIVE Final    Comment:        The GeneXpert MRSA Assay (FDA approved for NASAL specimens only), is one component of a comprehensive MRSA colonization surveillance program. It is not intended to diagnose MRSA infection nor to guide or monitor treatment for MRSA infections. Performed at [facility], [address] [place], [place] [address]   Urine Culture     Status: Abnormal   Collection Time: 06/23/19  5:40 AM   Specimen: Urine, Random  Result Value Ref Range Status   Specimen Description   Final    URINE, RANDOM Performed at [facility], [address], [place], [place] [address]    Special Requests   Final    NONE Performed at [facility], [place], [place] [address]    Culture (A)  Final    80,000 COLONIES/mL ESCHERICHIA COLI >=100,000 COLONIES/mL AEROCOCCUS URINAE Standardized susceptibility testing for this organism is not available. Performed at [facility], [place] [address], [place],  [address]    Report Status 06/27/2019 FINAL  Final   Organism ID, Bacteria ESCHERICHIA COLI (A)  Final      Susceptibility   Escherichia coli - MIC*    AMPICILLIN >=32 RESISTANT Resistant     CEFAZOLIN >=64 RESISTANT Resistant     CEFTRIAXONE <=1 SENSITIVE Sensitive     CIPROFLOXACIN >=4 RESISTANT Resistant     GENTAMICIN <=1 SENSITIVE Sensitive     IMIPENEM <=0.25 SENSITIVE Sensitive     NITROFURANTOIN <=16 SENSITIVE Sensitive     TRIMETH/SULFA >=320 RESISTANT Resistant     AMPICILLIN/SULBACTAM >=32 RESISTANT Resistant     PIP/TAZO 64 INTERMEDIATE Intermediate     Extended ESBL NEGATIVE Sensitive     * 80,000 COLONIES/mL ESCHERICHIA COLI  SARS CORONAVIRUS 2 (TAT 6-24 HRS) Nasopharyngeal Nasopharyngeal Swab     Status: None   Collection Time: 06/26/19  1:49 PM    Specimen: Nasopharyngeal Swab  Result Value Ref Range Status   SARS Coronavirus 2 NEGATIVE NEGATIVE Final    Comment: (NOTE) SARS-CoV-2 target nucleic acids are NOT DETECTED. The SARS-CoV-2 RNA is generally detectable in upper and lower respiratory specimens during the acute phase of infection. Negative results do not preclude SARS-CoV-2 infection, do not rule out co-infections with other pathogens, and should not be used as the sole basis for treatment or other patient management decisions. Negative results must be combined with clinical observations, patient history, and epidemiological information. The expected result is Negative. Fact Sheet for Patients: SugarRoll.be Fact Sheet for Healthcare Providers: https://www.woods-mathews.com/ This test is not yet approved or cleared by the [place] FDA and  has been authorized for detection and/or diagnosis of SARS-CoV-2 by FDA under an Emergency Use Authorization (EUA). This EUA will remain  in effect (meaning this test can be used) for the duration of the COVID-19 declaration under Section 56 4(b)(1) of the Act, 21 U.S.C. section 360bbb-3(b)(1), unless the  authorization is terminated or revoked sooner. Performed at [facility], [place] [address] [place], [place] [address]       Studies: No results found.  Scheduled Meds: . aspirin EC  81 mg Oral Daily  . atorvastatin  40 mg Oral q1800  . busPIRone  15 mg Oral BID  . clopidogrel  75 mg Oral Daily  . enoxaparin (LOVENOX) injection  40 mg Subcutaneous Q24H  . feeding supplement (ENSURE ENLIVE)  237 mL Oral Q24H  . fentaNYL  1 patch Transdermal Q72H  . ferrous sulfate  325 mg Oral Q breakfast  . levothyroxine  50 mcg Oral QAC breakfast  . mirtazapine  30 mg Oral QHS  . pantoprazole  40 mg Oral Daily  . saccharomyces boulardii  250 mg Oral BID  . senna-docusate  1 tablet Oral BID  . sertraline  25 mg Oral Daily  . vitamin B-12   1,000 mcg Oral Daily    Continuous Infusions:   LOS: 9 days     Annita Brod, MD [facility]  To reach me or the doctor on call, go to: [email or web address] Password [email or web address]  07/02/2019, 12:29 PM

## 2019-07-02 NOTE — Progress Notes (Signed)
Daily Progress Note   Patient Name: Kerri Rangel       Date: 07/02/2019 DOB: 05-02-1924  Age: 85 y.o. MRN#: [id number] Attending Physician: Annita Brod, MD Primary Care Physician: Orvis [facility], [facility] Admit Date: 06/22/2019  Reason for Consultation/Follow-up: Establishing goals of care  Subjective: Patient is resting in bed. She does not awaken to voice or touch. Spoke with daughter [name]. Discussed her poor PO intake, inability to work with PT today,  and concerns for quality of life and patient desires. She states her mother was very happy when she arrived, and very sad when she was leaving. She would like her mother to go to a place where family can visit with her. Family discussing hospice facility placement. Will call tomorrow as requested by daughter for definitive goals moving forward.   Length of Stay: 9  Current Medications: Scheduled Meds:  . aspirin EC  81 mg Oral Daily  . atorvastatin  40 mg Oral q1800  . busPIRone  15 mg Oral BID  . clopidogrel  75 mg Oral Daily  . enoxaparin (LOVENOX) injection  40 mg Subcutaneous Q24H  . feeding supplement (ENSURE ENLIVE)  237 mL Oral Q24H  . fentaNYL  1 patch Transdermal Q72H  . ferrous sulfate  325 mg Oral Q breakfast  . levothyroxine  50 mcg Oral QAC breakfast  . mirtazapine  30 mg Oral QHS  . pantoprazole  40 mg Oral Daily  . saccharomyces boulardii  250 mg Oral BID  . senna-docusate  1 tablet Oral BID  . sertraline  25 mg Oral Daily  . vitamin B-12  1,000 mcg Oral Daily    Continuous Infusions:   PRN Meds: acetaminophen **OR** acetaminophen, alum & mag hydroxide-simeth, hydrALAZINE, loperamide, LORazepam, magnesium hydroxide, ondansetron **OR** ondansetron (ZOFRAN) IV  Physical Exam Constitutional:    Comments: Resting with eyes closed. Frail appearing.              Vital Signs: BP (!) 132/43 (BP Location: Right Arm)   Pulse (!) 52   Temp 98.3 F (36.8 C) (Oral)   Resp 16   Ht 5\' 5"  (1.651 m)   Wt 68 kg   SpO2 95%   BMI 24.96 kg/m  SpO2: SpO2: 95 % O2 Device: O2 Device: Room Air O2 Flow Rate:    Intake/output  summary: No intake or output data in the 24 hours ending 07/02/19 1506 LBM: Last BM Date: 07/01/19 Baseline Weight: Weight: 68 kg Most recent weight: Weight: 68 kg       Palliative Assessment/Data: 30%      Patient Active Problem List   Diagnosis Date Noted  . Essential hypertension 06/28/2019  . Stroke ([place]) 06/23/2019  . CVA (cerebral vascular accident) ([place]) 06/22/2019  . Pressure ulcer, heel 11/08/2017  . Closed left hip fracture ([place]) 09/25/2017  . Carotid artery disease ([place]) 11/06/2016  . Anxiety 08/07/2016  . Depression 08/07/2016  . Hyperlipidemia, unspecified 08/07/2016  . Hypertension 08/07/2016  . Hypothyroidism 08/07/2016  . Vitamin D deficiency   . Dementia ([place])   . Common bile duct (CBD) obstruction 08/01/2016  . Gallstone 08/01/2016  . Late onset Alzheimer's disease with behavioral disturbance ([place]) 03/02/2015  . Dizzy 03/02/2014  . Edema 03/02/2014    Palliative Care Assessment & Plan    Recommendations/Plan:  Daughter considering hospice placement.    Code Status:    Code Status Orders  (From admission, onward)         Start     Ordered   06/22/19 2055  Do not attempt resuscitation (DNR)  Continuous    Question Answer Comment  In the event of cardiac or respiratory ARREST Do not call a "code blue"   In the event of cardiac or respiratory ARREST Do not perform Intubation, CPR, defibrillation or ACLS   In the event of cardiac or respiratory ARREST Use medication by any route, position, wound care, and other measures to relive pain and suffering. May use oxygen, suction and manual treatment of airway obstruction as  needed for comfort.      06/22/19 2054        Code Status History    Date Active Date Inactive Code Status Order ID Comments User Context   09/25/2017 2317 09/30/2017 1925 Full Code [id number]  Amelia Jo, MD ED   Advance Care Planning Activity       Prognosis:  Poor: Poor intake. Has not worked with PT.   Discharge Planning:  To Be Determined  Care plan was discussed with CM  Thank you for allowing the Palliative Medicine Team to assist in the care of this patient.   Total Time 35 min Prolonged Time Billed  no      Greater than 50%  of this time was spent counseling and coordinating care related to the above assessment and plan.  Asencion Gowda, NP  Please contact Palliative Medicine Team phone at [phone] for questions and concerns.

## 2019-07-02 NOTE — Progress Notes (Signed)
Son of patient came to visit.  He had many questions and required clarification of goals of care.  He stated his sister got conflicting information.  I discussed that the patient was very lethargic and had a very poor appetite (took only two bites of breakfast).  I talked about nutrition and digestion as we near the end of life.  I explained comfort care, hospice, palliative, and general rehab/SNF and offered support.  I informed the patient that he, nor his sister, need to make any immediate decisions, but that sometime in the near future they may want to readdress her current situation.  I also informed him that he and his sister will have 100% of our support through their decision making process.  Sister is supposed to come meet with SW and see the patient.  I will make myself available for her if she needs.

## 2019-07-03 MED ORDER — MORPHINE SULFATE (CONCENTRATE) 10 MG/0.5ML PO SOLN: 5.0000 mg | ORAL | Status: DC | PRN

## 2019-07-03 MED ORDER — SODIUM CHLORIDE 0.9% FLUSH: 3.0000 mL | INTRAVENOUS | Status: DC | PRN

## 2019-07-03 MED ORDER — ONDANSETRON HCL 4 MG/2ML IJ SOLN: 4.0000 mg | Freq: Four times a day (QID) | INTRAMUSCULAR | Status: DC | PRN

## 2019-07-03 MED ORDER — LORAZEPAM 1 MG PO TABS
1.0000 mg | ORAL_TABLET | ORAL | Status: DC | PRN
  Administered 2019-07-08 – 2019-07-09 (×2): 1 mg via ORAL
  Filled 2019-07-03 (×2): qty 1

## 2019-07-03 MED ORDER — GLYCOPYRROLATE 1 MG PO TABS
1.0000 mg | ORAL_TABLET | ORAL | Status: DC | PRN
  Filled 2019-07-03: qty 1

## 2019-07-03 MED ORDER — ACETAMINOPHEN 650 MG RE SUPP: 650.0000 mg | Freq: Four times a day (QID) | RECTAL | Status: DC | PRN

## 2019-07-03 MED ORDER — POLYVINYL ALCOHOL 1.4 % OP SOLN
1.0000 [drp] | Freq: Four times a day (QID) | OPHTHALMIC | Status: DC | PRN
  Filled 2019-07-03: qty 15

## 2019-07-03 MED ORDER — SODIUM CHLORIDE 0.9 % IV SOLN: 250.0000 mL | INTRAVENOUS | Status: DC | PRN

## 2019-07-03 MED ORDER — BIOTENE DRY MOUTH MT LIQD: 15.0000 mL | OROMUCOSAL | Status: DC | PRN

## 2019-07-03 MED ORDER — GLYCOPYRROLATE 0.2 MG/ML IJ SOLN
0.2000 mg | INTRAMUSCULAR | Status: DC | PRN
  Filled 2019-07-03: qty 1

## 2019-07-03 MED ORDER — HALOPERIDOL LACTATE 5 MG/ML IJ SOLN: 0.5000 mg | INTRAMUSCULAR | Status: DC | PRN

## 2019-07-03 MED ORDER — ACETAMINOPHEN 325 MG PO TABS: 650.0000 mg | ORAL_TABLET | Freq: Four times a day (QID) | ORAL | Status: DC | PRN

## 2019-07-03 MED ORDER — LORAZEPAM 2 MG/ML IJ SOLN
0.5000 mg | INTRAMUSCULAR | Status: DC | PRN
  Administered 2019-07-09: 0.5 mg via INTRAVENOUS
  Filled 2019-07-03: qty 1

## 2019-07-03 MED ORDER — SODIUM CHLORIDE 0.9% FLUSH
3.0000 mL | Freq: Two times a day (BID) | INTRAVENOUS | Status: DC
  Administered 2019-07-03 – 2019-07-09 (×10): 3 mL via INTRAVENOUS

## 2019-07-03 MED ORDER — HALOPERIDOL LACTATE 2 MG/ML PO CONC
0.5000 mg | ORAL | Status: DC | PRN
  Filled 2019-07-03: qty 0.3

## 2019-07-03 MED ORDER — ONDANSETRON 4 MG PO TBDP: 4.0000 mg | ORAL_TABLET | Freq: Four times a day (QID) | ORAL | Status: DC | PRN

## 2019-07-03 MED ORDER — LORAZEPAM 2 MG/ML PO CONC: 1.0000 mg | ORAL | Status: DC | PRN

## 2019-07-03 MED ORDER — HALOPERIDOL 0.5 MG PO TABS
0.5000 mg | ORAL_TABLET | ORAL | Status: DC | PRN
  Filled 2019-07-03: qty 1

## 2019-07-03 NOTE — Progress Notes (Signed)
PROGRESS NOTE  BAYAN HEDSTROM BVA:[id number] DOB: 01/08/1924 DOA: 06/22/2019 PCP: [facility]  HPI/Recap of past 54 hours: 85 year old woman PMH colon cancer, dementia, anxiety, depression, hypothyroidism presented with left upper extremity weakness.  Had recently been seen in the ED for superficial skin tear left side.  CT head suggested possible right parietal infarct.  MRI showed large acute posterior right MCA territory infarct without hemorrhage.  Neurology with recommendations for aspirin and Plavix for 3 weeks then Plavix alone.  SNF recommended but insurance company denied rehab.  10/31 attending physician discussed peer to peer and insurance authorization denial was upheld.  Oral intakes remain poor and participation is limited.  Palliative medicine involved.    After much discussion, we will start comfort care here and then transfer to hospice facility.  Patient herself about the same, withdrawn.  Assessment/Plan: Large acute posterior right MCA infarct without hemorrhage.  Etiology likely embolic.  On aspirin prior to admission.  Carotid artery ultrasound no significant ICA stenosis but revealed incomplete steal in right vertebral artery which suggests stenosis proximal subclavian or brachiocephalic arteries.  Echocardiogram showed no cardiac source of emboli.  LDL 111.  A1c 4.9.  Seen by neurology, with recommendation for aspirin and Plavix. --Continue aspirin, Plavix for 3 weeks then continue Plavix alone.  Continue statin. --Continue dysphagia 1 diet with thin liquids but oral intake very poor Met with palliative care, life expectation is less than 2 weeks given pretty much absence of p.o. intake now at this point.  Plan is for hospice facility, starting comfort care here.  Left wrist pain status post fall prior to admission --Repeat imaging this admission was negative for fracture or acute abnormality in the left wrist.  Extensive degenerative changes were seen.   Left humerus film was negative.  Left hand film showed extensive degenerative changes.  No acute abnormality.  Left forearm film was negative.  Essential hypertension  Would not resume atenolol or Aricept given borderline bradycardia off these agents.  Sinus bradycardia --Atenolol held.  Donepezil discontinued.  Hypothyroidism Stopping medications for comfort care  Asymptomatic bacteriuria.  Urine culture noted.  Given lack of fever or symptoms, no treatment is indicated. --No treatment indicated.  PMH colon cancer  Late onset Alzheimer's dementia with behavioral disturbance --Appears stable.  She does awaken to voice and answer simple questions.  With bradycardia stopped donezepil. --Mostly bedbound since hip fracture last year per chart  Circumferential wall thickening of the upper esophagus likely due to reflux esophagitis --No further evaluation suggested  Code Status: DNR/comfort care  Family Communication: Spoke with son.  Disposition Plan: Hospice facility   Consultants:  Hospice and palliative care  Neurology  Procedures:  Carotid ultrasound: Mild stenosis right ICA, mild stenosis left ICA.  Incomplete steal right vertebral artery suggest stenosis in more proximal subclavian or brachiocephalic arteries.  Antimicrobials:  None  DVT prophylaxis: Lovenox   Objective: Vitals:   07/02/19 1943 07/03/19 0446  BP: (!) 161/74 140/81  Pulse: 87 62  Resp: 20 18  Temp: 98.2 F (36.8 C) 98.5 F (36.9 C)  SpO2: 100% 96%    Intake/Output Summary (Last 24 hours) at 07/03/2019 1546 Last data filed at 07/03/2019 1011 Gross per 24 hour  Intake 120 ml  Output -  Net 120 ml   Filed Weights   06/22/19 1310  Weight: 68 kg   Body mass index is 24.96 kg/m.  Exam:   General: Resting comfortably, no acute distress    Data Reviewed: CBC:  Recent Labs  Lab 06/27/19 0623  WBC 10.2  HGB 11.1*  HCT 34.7*  MCV 94.3  PLT 696   Basic Metabolic  Panel: Recent Labs  Lab 06/27/19 0623 06/29/19 0510  NA 142  --   K 3.6  --   CL 116*  --   CO2 23  --   GLUCOSE 108*  --   BUN 19  --   CREATININE 0.60 0.81  CALCIUM 10.3  --   MG 1.8  --    GFR: Estimated Creatinine Clearance: 39 mL/min (by C-G formula based on SCr of 0.81 mg/dL). Liver Function Tests: No results for input(s): AST, ALT, ALKPHOS, BILITOT, PROT, ALBUMIN in the last 168 hours. No results for input(s): LIPASE, AMYLASE in the last 168 hours. No results for input(s): AMMONIA in the last 168 hours. Coagulation Profile: No results for input(s): INR, PROTIME in the last 168 hours. Cardiac Enzymes: No results for input(s): CKTOTAL, CKMB, CKMBINDEX, TROPONINI in the last 168 hours. BNP (last 3 results) No results for input(s): PROBNP in the last 8760 hours. HbA1C: No results for input(s): HGBA1C in the last 72 hours. CBG: No results for input(s): GLUCAP in the last 168 hours. Lipid Profile: No results for input(s): CHOL, HDL, LDLCALC, TRIG, CHOLHDL, LDLDIRECT in the last 72 hours. Thyroid Function Tests: No results for input(s): TSH, T4TOTAL, FREET4, T3FREE, THYROIDAB in the last 72 hours. Anemia Panel: No results for input(s): VITAMINB12, FOLATE, FERRITIN, TIBC, IRON, RETICCTPCT in the last 72 hours. Urine analysis:    Component Value Date/Time   COLORURINE AMBER (A) 06/23/2019 0539   APPEARANCEUR CLOUDY (A) 06/23/2019 0539   APPEARANCEUR Clear 12/23/2014 1349   LABSPEC 1.023 06/23/2019 0539   LABSPEC 1.009 12/23/2014 1349   PHURINE 6.0 06/23/2019 0539   GLUCOSEU NEGATIVE 06/23/2019 0539   GLUCOSEU Negative 12/23/2014 1349   HGBUR NEGATIVE 06/23/2019 0539   BILIRUBINUR NEGATIVE 06/23/2019 0539   BILIRUBINUR Negative 12/23/2014 1349   KETONESUR NEGATIVE 06/23/2019 0539   PROTEINUR 100 (A) 06/23/2019 0539   NITRITE POSITIVE (A) 06/23/2019 0539   LEUKOCYTESUR MODERATE (A) 06/23/2019 0539   LEUKOCYTESUR Trace 12/23/2014 1349   Sepsis Labs:  _0 (procalcitonin:4,lacticidven:4)  ) Recent Results (from the past 240 hour(s))  SARS CORONAVIRUS 2 (TAT 6-24 HRS) Nasopharyngeal Nasopharyngeal Swab     Status: None   Collection Time: 06/26/19  1:49 PM   Specimen: Nasopharyngeal Swab  Result Value Ref Range Status   SARS Coronavirus 2 NEGATIVE NEGATIVE Final    Comment: (NOTE) SARS-CoV-2 target nucleic acids are NOT DETECTED. The SARS-CoV-2 RNA is generally detectable in upper and lower respiratory specimens during the acute phase of infection. Negative results do not preclude SARS-CoV-2 infection, do not rule out co-infections with other pathogens, and should not be used as the sole basis for treatment or other patient management decisions. Negative results must be combined with clinical observations, patient history, and epidemiological information. The expected result is Negative. Fact Sheet for Patients: SugarRoll.be Fact Sheet for Healthcare Providers: https://www.woods-mathews.com/ This test is not yet approved or cleared by the [place] FDA and  has been authorized for detection and/or diagnosis of SARS-CoV-2 by FDA under an Emergency Use Authorization (EUA). This EUA will remain  in effect (meaning this test can be used) for the duration of the COVID-19 declaration under Section 56 4(b)(1) of the Act, 21 U.S.C. section 360bbb-3(b)(1), unless the authorization is terminated or revoked sooner. Performed at [facility], [place] [address] [place], [place] [address]  Studies: No results found.  Scheduled Meds: . busPIRone  15 mg Oral BID  . feeding supplement (ENSURE ENLIVE)  237 mL Oral Q24H  . fentaNYL  1 patch Transdermal Q72H  . levothyroxine  50 mcg Oral QAC breakfast  . mirtazapine  30 mg Oral QHS  . pantoprazole  40 mg Oral Daily  . senna-docusate  1 tablet Oral BID  . sertraline  25 mg Oral Daily  . sodium chloride flush  3 mL Intravenous  Q12H    Continuous Infusions: . sodium chloride       LOS: 10 days     Annita Brod, MD [facility]  To reach me or the doctor on call, go to: [email or web address] Password [facility]  07/03/2019, 3:46 PM

## 2019-07-03 NOTE — Care Management Important Message (Signed)
Important Message  Patient Details  Name: Kerri Rangel MRN: [id number] Date of Birth: 12-17-1923   Medicare Important Message Given:  Yes     Juliann Pulse A Aalaysia Liggins 07/03/2019, 10:56 AM

## 2019-07-03 NOTE — TOC Progression Note (Signed)
Transition of Care [facility]) - Progression Note    Patient Details  Name: Kerri Rangel MRN: [id number] Date of Birth: [birth year]  [facility]) CM/SW Contact  Shelbie Hutching, RN Phone Number: 07/03/2019, 11:59 AM  Clinical Narrative:     Patient's daughter [name] and the rest of the patient's family have talked and they would like for the patient to go to residential hospice.  Residential hospice choice offered.  Family chooses [facility] home in [place].  [name] would also like for the patient to be comfort care while in the hospital waiting on a bed at the hospice home.   Residential hospice referral given to Flo Shanks with [facility].    Expected Discharge Plan: [place] Barriers to Discharge: Continued Medical Work up  Expected Discharge Plan and Services Expected Discharge Plan: [place]   Discharge Planning Services: CM Consult   Living arrangements for the past 2 months: Assisted Living Facility(Memory Care)                                       Social Determinants of Health (SDOH) Interventions    Readmission Risk Interventions No flowsheet data found.

## 2019-07-03 NOTE — Progress Notes (Addendum)
New referral for [facility] hospice home received from [facility]. Patient information faxed to referral. Hospital care team notified that there is currently no bed available for transfer today. Writer to speak with family.  Update: Probation officer spoke ib the room with patient's daughter [name] to initiate education regarding hospice services, philosophy, team approach to care and current visitation policy with understanding voiced. [name] is aware there is currently no bed available for transfer. Week end hospice home staff will contact weekend [place] if a bed becomes available  Flo Shanks BSN, RN, [place] [phone]

## 2019-07-03 NOTE — Progress Notes (Signed)
Daily Progress Note   Patient Name: Kerri Rangel       Date: 07/03/2019 DOB: July 21, 1924  Age: 85 y.o. MRN#: [id number] Attending Physician: Annita Brod, MD Primary Care Physician: Orvis Brill, [facility] Admit Date: 06/22/2019  Reason for Consultation/Follow-up: Establishing goals of care  Subjective: Patient is resting in bed.Son is at bedside. He advises they would like to shift to comfort care and have discussed this with CM. Spoke with HPOA daughter on phone. She confirms this and a MOST form was completed through [facility].  I completed a MOST form today through [facility], and the signed original was placed in the chart. A photocopy was placed in the chart to be scanned into EMR. The patient's children outlined their wishes for the following treatment decisions:  Cardiopulmonary Resuscitation: Do Not Attempt Resuscitation (DNR/No CPR)  Medical Interventions: Comfort Measures: Keep clean, warm, and dry. Use medication by any route, positioning, wound care, and other measures to relieve pain and suffering. Use oxygen, suction and manual treatment of airway obstruction as needed for comfort. Do not transfer to the hospital unless comfort needs cannot be met in current location.  Antibiotics: No antibiotics (use other measures to relieve symptoms)  IV Fluids: No IV fluids (provide other measures to ensure comfort)  Feeding Tube: No feeding tube   Length of Stay: 10  Current Medications: Scheduled Meds:  . aspirin EC  81 mg Oral Daily  . atorvastatin  40 mg Oral q1800  . busPIRone  15 mg Oral BID  . clopidogrel  75 mg Oral Daily  . enoxaparin (LOVENOX) injection  40 mg Subcutaneous Q24H  . feeding supplement (ENSURE ENLIVE)  237 mL Oral Q24H  . fentaNYL  1 patch Transdermal Q72H   . ferrous sulfate  325 mg Oral Q breakfast  . levothyroxine  50 mcg Oral QAC breakfast  . mirtazapine  30 mg Oral QHS  . pantoprazole  40 mg Oral Daily  . saccharomyces boulardii  250 mg Oral BID  . senna-docusate  1 tablet Oral BID  . sertraline  25 mg Oral Daily  . vitamin B-12  1,000 mcg Oral Daily    Continuous Infusions:   PRN Meds: acetaminophen **OR** acetaminophen, alum & mag hydroxide-simeth, hydrALAZINE, loperamide, LORazepam, magnesium hydroxide, ondansetron **OR** ondansetron (ZOFRAN) IV  Physical Exam Constitutional:  Comments: Resting with eyes closed. Frail appearing.              Vital Signs: BP 140/81 (BP Location: Right Arm)   Pulse 62   Temp 98.5 F (36.9 C) (Oral)   Resp 18   Ht 5' 5" (1.651 m)   Wt 68 kg   SpO2 96%   BMI 24.96 kg/m  SpO2: SpO2: 96 % O2 Device: O2 Device: Room Air O2 Flow Rate:    Intake/output summary:   Intake/Output Summary (Last 24 hours) at 07/03/2019 1221 Last data filed at 07/03/2019 1011 Gross per 24 hour  Intake 120 ml  Output -  Net 120 ml   LBM: Last BM Date: 07/01/19 Baseline Weight: Weight: 68 kg Most recent weight: Weight: 68 kg       Palliative Assessment/Data: 30%    Flowsheet Rows     Most Recent Value  Intake Tab  Referral Department  Hospitalist  Unit at Time of Referral  Med/Surg Unit  Palliative Care Primary Diagnosis  Neurology  Date Notified  06/27/19  Palliative Care Type  New Palliative care  Reason for referral  Clarify Goals of Care  Date of Admission  06/22/19  Date first seen by Palliative Care  06/30/19  # of days Palliative referral response time  3 Day(s)  # of days IP prior to Palliative referral  5  Clinical Assessment  Psychosocial & Spiritual Assessment  Palliative Care Outcomes      Patient Active Problem List   Diagnosis Date Noted  . Essential hypertension 06/28/2019  . Stroke ([place]) 06/23/2019  . CVA (cerebral vascular accident) ([place]) 06/22/2019  . Pressure  ulcer, heel 11/08/2017  . Closed left hip fracture ([place]) 09/25/2017  . Carotid artery disease ([place]) 11/06/2016  . Anxiety 08/07/2016  . Depression 08/07/2016  . Hyperlipidemia, unspecified 08/07/2016  . Hypertension 08/07/2016  . Hypothyroidism 08/07/2016  . Vitamin D deficiency   . Dementia ([place])   . Common bile duct (CBD) obstruction 08/01/2016  . Gallstone 08/01/2016  . Late onset Alzheimer's disease with behavioral disturbance ([place]) 03/02/2015  . Dizzy 03/02/2014  . Edema 03/02/2014    Palliative Care Assessment & Plan    Recommendations/Plan:  Hospice facility   Code Status:    Code Status Orders  (From admission, onward)         Start     Ordered   06/22/19 2055  Do not attempt resuscitation (DNR)  Continuous    Question Answer Comment  In the event of cardiac or respiratory ARREST Do not call a "code blue"   In the event of cardiac or respiratory ARREST Do not perform Intubation, CPR, defibrillation or ACLS   In the event of cardiac or respiratory ARREST Use medication by any route, position, wound care, and other measures to relive pain and suffering. May use oxygen, suction and manual treatment of airway obstruction as needed for comfort.      06/22/19 2054        Code Status History    Date Active Date Inactive Code Status Order ID Comments User Context   09/25/2017 2317 09/30/2017 1925 Full Code [id number]  Amelia Jo, MD ED   Advance Care Planning Activity       Prognosis:  Poor: Poor to no intake. Large acute CVA. Dementia    Care plan was confirmed with CM and Primary team  Thank you for allowing the Palliative Medicine Team to assist in the care of this patient.   Total  Time 11:35-12:25 50 min Prolonged Time Billed  no      Greater than 50%  of this time was spent counseling and coordinating care related to the above assessment and plan.  Asencion Gowda, NP  Please contact Palliative Medicine Team phone at [phone] for questions  and concerns.

## 2019-07-03 NOTE — Progress Notes (Signed)
OT Cancellation Note  Patient Details Name: Kerri Rangel MRN: [id number] DOB: 1923/12/10   Cancelled Treatment:    Reason Eval/Treat Not Completed: Other (comment). Pt lethargic, unable to wake for meaningful participation in therapy. Per chart, family considering hospice. Will re-attempt next. If unable to participate, will cancel current order.   Jeni Salles, MPH, MS, OTR/L ascom [phone] 07/03/19, 11:37 AM

## 2019-07-03 NOTE — Progress Notes (Signed)
PT Cancellation Note  Patient Details Name: Kerri Rangel MRN: [id number] DOB: [birth year]   Cancelled Treatment:    Reason Eval/Treat Not Completed: Other (comment). Treatment attempted, however pt very lethargic. Doesn't awaken to verbal/tactile cues. Attempted PROM with B LE with pt still not waking up. She isn't able to participate this date. Per chart review, family considering hospice. Will attempt 1 more time next date. If unable to participate, will cancel current order.   Autym Siess 07/03/2019, 9:39 AM Greggory Stallion, PT, DPT [phone]

## 2019-07-04 NOTE — Plan of Care (Signed)
  Problem: Education: Goal: Knowledge of disease or condition will improve Outcome: Progressing Goal: Knowledge of secondary prevention will improve Outcome: Progressing Goal: Knowledge of patient specific risk factors addressed and post discharge goals established will improve Outcome: Progressing   Problem: Coping: Goal: Will verbalize positive feelings about self Outcome: Progressing Goal: Will identify appropriate support needs Outcome: Progressing   Problem: Health Behavior/Discharge Planning: Goal: Ability to manage health-related needs will improve Outcome: Progressing   Problem: Self-Care: Goal: Ability to participate in self-care as condition permits will improve Outcome: Progressing Goal: Verbalization of feelings and concerns over difficulty with self-care will improve Outcome: Progressing Goal: Ability to communicate needs accurately will improve Outcome: Progressing   Problem: Nutrition: Goal: Risk of aspiration will decrease Outcome: Progressing   Problem: Ischemic Stroke/TIA Tissue Perfusion: Goal: Complications of ischemic stroke/TIA will be minimized Outcome: Progressing   Problem: Health Behavior/Discharge Planning: Goal: Ability to manage health-related needs will improve Outcome: Progressing   Problem: Clinical Measurements: Goal: Ability to maintain clinical measurements within normal limits will improve Outcome: Progressing Goal: Will remain free from infection Outcome: Progressing Goal: Diagnostic test results will improve Outcome: Progressing   Problem: Activity: Goal: Risk for activity intolerance will decrease Outcome: Progressing   Problem: Coping: Goal: Level of anxiety will decrease Outcome: Progressing   Problem: Elimination: Goal: Will not experience complications related to bowel motility Outcome: Progressing Goal: Will not experience complications related to urinary retention Outcome: Progressing   Problem: Pain  Managment: Goal: General experience of comfort will improve Outcome: Progressing   Problem: Safety: Goal: Ability to remain free from injury will improve Outcome: Progressing   Problem: Skin Integrity: Goal: Risk for impaired skin integrity will decrease Outcome: Progressing   Problem: Education: Goal: Knowledge of General Education information will improve Description: Including pain rating scale, medication(s)/side effects and non-pharmacologic comfort measures Outcome: Progressing   Problem: Pain Managment: Goal: General experience of comfort will improve Outcome: Progressing   Problem: Safety: Goal: Ability to remain free from injury will improve Outcome: Progressing

## 2019-07-04 NOTE — Progress Notes (Signed)
PROGRESS NOTE  Kerri Rangel QQP:[id number] DOB: 1924/04/18 DOA: 06/22/2019 PCP: [facility]  HPI/Recap of past 36 hours: 85 year old woman PMH colon cancer, dementia, anxiety, depression, hypothyroidism presented with left upper extremity weakness.  Had recently been seen in the ED for superficial skin tear left side.  CT head suggested possible right parietal infarct.  MRI showed large acute posterior right MCA territory infarct without hemorrhage.  Neurology with recommendations for aspirin and Plavix for 3 weeks then Plavix alone.  SNF recommended but insurance company denied rehab.  10/31 attending physician discussed peer to peer and insurance authorization denial was upheld.  Oral intakes remain poor and participation is limited.  Palliative medicine involved.    After much discussion, we will start comfort care here and then transfer to hospice facility.  Patient resting comfortably, no complaints  Assessment/Plan: Large acute posterior right MCA infarct without hemorrhage.  Etiology likely embolic.  On aspirin prior to admission.  Carotid artery ultrasound no significant ICA stenosis but revealed incomplete steal in right vertebral artery which suggests stenosis proximal subclavian or brachiocephalic arteries.  Echocardiogram showed no cardiac source of emboli.  LDL 111.  A1c 4.9.  Seen by neurology, with recommendation for aspirin and Plavix. --Continue aspirin, Plavix for 3 weeks then continue Plavix alone.  Continue statin. --Continue dysphagia 1 diet with thin liquids but oral intake very poor Met with palliative care, life expectation is less than 2 weeks given pretty much absence of p.o. intake now at this point.  Plan is for hospice facility, starting comfort care here.  Left wrist pain status post fall prior to admission --Repeat imaging this admission was negative for fracture or acute abnormality in the left wrist.  Extensive degenerative changes were seen.   Left humerus film was negative.  Left hand film showed extensive degenerative changes.  No acute abnormality.  Left forearm film was negative.  Essential hypertension  Would not resume atenolol or Aricept given borderline bradycardia off these agents.  Sinus bradycardia --Atenolol held.  Donepezil discontinued.  Hypothyroidism Stopping medications for comfort care  Asymptomatic bacteriuria.  Urine culture noted.  Given lack of fever or symptoms, no treatment is indicated. --No treatment indicated.  PMH colon cancer  Late onset Alzheimer's dementia with behavioral disturbance --Appears stable.  She does awaken to voice and answer simple questions.  With bradycardia stopped donezepil. --Mostly bedbound since hip fracture last year per chart  Circumferential wall thickening of the upper esophagus likely due to reflux esophagitis --No further evaluation suggested  Code Status: DNR/comfort care  Family Communication: Left message for son.  Disposition Plan: Hospice facility, bed pending   Consultants:  Hospice and palliative care  Neurology  Procedures:  Carotid ultrasound: Mild stenosis right ICA, mild stenosis left ICA.  Incomplete steal right vertebral artery suggest stenosis in more proximal subclavian or brachiocephalic arteries.  Antimicrobials:  None  DVT prophylaxis: Lovenox   Objective: Vitals:   07/03/19 1606 07/03/19 1954  BP: (!) 160/71 (!) 158/72  Pulse: 62 82  Resp: 18 19  Temp: 98.5 F (36.9 C) 98.2 F (36.8 C)  SpO2: 98% 99%   No intake or output data in the 24 hours ending 07/04/19 1423 Filed Weights   06/22/19 1310  Weight: 68 kg   Body mass index is 24.96 kg/m.  Exam:   General: Resting comfortably, no acute distress    Data Reviewed: CBC: No results for input(s): WBC, NEUTROABS, HGB, HCT, MCV, PLT in the last 168 hours. Basic Metabolic  Panel: Recent Labs  Lab 06/29/19 0510  CREATININE 0.81   GFR: Estimated  Creatinine Clearance: 39 mL/min (by C-G formula based on SCr of 0.81 mg/dL). Liver Function Tests: No results for input(s): AST, ALT, ALKPHOS, BILITOT, PROT, ALBUMIN in the last 168 hours. No results for input(s): LIPASE, AMYLASE in the last 168 hours. No results for input(s): AMMONIA in the last 168 hours. Coagulation Profile: No results for input(s): INR, PROTIME in the last 168 hours. Cardiac Enzymes: No results for input(s): CKTOTAL, CKMB, CKMBINDEX, TROPONINI in the last 168 hours. BNP (last 3 results) No results for input(s): PROBNP in the last 8760 hours. HbA1C: No results for input(s): HGBA1C in the last 72 hours. CBG: No results for input(s): GLUCAP in the last 168 hours. Lipid Profile: No results for input(s): CHOL, HDL, LDLCALC, TRIG, CHOLHDL, LDLDIRECT in the last 72 hours. Thyroid Function Tests: No results for input(s): TSH, T4TOTAL, FREET4, T3FREE, THYROIDAB in the last 72 hours. Anemia Panel: No results for input(s): VITAMINB12, FOLATE, FERRITIN, TIBC, IRON, RETICCTPCT in the last 72 hours. Urine analysis:    Component Value Date/Time   COLORURINE AMBER (A) 06/23/2019 0539   APPEARANCEUR CLOUDY (A) 06/23/2019 0539   APPEARANCEUR Clear 12/23/2014 1349   LABSPEC 1.023 06/23/2019 0539   LABSPEC 1.009 12/23/2014 1349   PHURINE 6.0 06/23/2019 0539   GLUCOSEU NEGATIVE 06/23/2019 0539   GLUCOSEU Negative 12/23/2014 1349   HGBUR NEGATIVE 06/23/2019 0539   BILIRUBINUR NEGATIVE 06/23/2019 0539   BILIRUBINUR Negative 12/23/2014 1349   KETONESUR NEGATIVE 06/23/2019 0539   PROTEINUR 100 (A) 06/23/2019 0539   NITRITE POSITIVE (A) 06/23/2019 0539   LEUKOCYTESUR MODERATE (A) 06/23/2019 0539   LEUKOCYTESUR Trace 12/23/2014 1349   Sepsis Labs: '@LABRCNTIP'$ (procalcitonin:4,lacticidven:4)  ) Recent Results (from the past 240 hour(s))  SARS CORONAVIRUS 2 (TAT 6-24 HRS) Nasopharyngeal Nasopharyngeal Swab     Status: None   Collection Time: 06/26/19  1:49 PM   Specimen:  Nasopharyngeal Swab  Result Value Ref Range Status   SARS Coronavirus 2 NEGATIVE NEGATIVE Final    Comment: (NOTE) SARS-CoV-2 target nucleic acids are NOT DETECTED. The SARS-CoV-2 RNA is generally detectable in upper and lower respiratory specimens during the acute phase of infection. Negative results do not preclude SARS-CoV-2 infection, do not rule out co-infections with other pathogens, and should not be used as the sole basis for treatment or other patient management decisions. Negative results must be combined with clinical observations, patient history, and epidemiological information. The expected result is Negative. Fact Sheet for Patients: SugarRoll.be Fact Sheet for Healthcare Providers: https://www.woods-mathews.com/ This test is not yet approved or cleared by the [place] FDA and  has been authorized for detection and/or diagnosis of SARS-CoV-2 by FDA under an Emergency Use Authorization (EUA). This EUA will remain  in effect (meaning this test can be used) for the duration of the COVID-19 declaration under Section 56 4(b)(1) of the Act, 21 U.S.C. section 360bbb-3(b)(1), unless the authorization is terminated or revoked sooner. Performed at [facility], [place] [address] [place], [place] [address]       Studies: No results found.  Scheduled Meds:  busPIRone  15 mg Oral BID   feeding supplement (ENSURE ENLIVE)  237 mL Oral Q24H   fentaNYL  1 patch Transdermal Q72H   levothyroxine  50 mcg Oral QAC breakfast   mirtazapine  30 mg Oral QHS   pantoprazole  40 mg Oral Daily   senna-docusate  1 tablet Oral BID   sertraline  25  mg Oral Daily   sodium chloride flush  3 mL Intravenous Q12H    Continuous Infusions:  sodium chloride       LOS: 11 days     Annita Brod, MD [facility]  To reach me or the doctor on call, go to: [email or web address] Password [facility]  07/04/2019, 2:23 PM

## 2019-07-04 NOTE — Progress Notes (Signed)
Pt receiving comfort care. Son and active in pt care. Pt eating small amounts and tolerating. No sign/symptom discomfort. Son verbalized acceptance and satisfied with pt care.

## 2019-07-04 NOTE — Progress Notes (Signed)
OT Cancellation Note  Patient Details Name: Kerri Rangel MRN: [id number] DOB: 05-27-1924   Cancelled Treatment:     OT discontinuation order received.  Please reconsult if further needs arise.  Thank you! Cyle Kenyon T Janette Harvie, OTR/L, CLT   Shyheem Whitham 07/04/2019, 10:33 AM

## 2019-07-05 NOTE — Progress Notes (Signed)
Pt alert/pleasantly confused. Pt drank entire ensure. Eating small amounts with 40 % meal at supper eaten. Family and active with care when present. Pt slept at intervals with no pain identified/issues.

## 2019-07-05 NOTE — Progress Notes (Signed)
PROGRESS NOTE  Kerri Rangel VPX:[id number] DOB: 10-Jun-1924 DOA: 06/22/2019 [facility]CP: [facility]  HPI/Recap of past 76 hours: 85 year old woman PMH colon cancer, dementia, anxiety, depression, hypothyroidism presented with left upper extremity weakness.  Had recently been seen in the ED for superficial skin tear left side.  CT head suggested possible right parietal infarct.  MRI showed large acute posterior right MCA territory infarct without hemorrhage.  Neurology with recommendations for aspirin and Plavix for 3 weeks then Plavix alone.  SNF recommended but insurance company denied rehab.  10/31 attending physician discussed peer to peer and insurance authorization denial was upheld.  Oral intakes remain poor and participation is limited.  Palliative medicine involved.    After much discussion, we will start comfort care here and then transfer to hospice facility.  Patient resting comfortably, no complaints, about the same  Assessment/Plan: Large acute posterior right MCA infarct without hemorrhage.  Etiology likely embolic.  On aspirin prior to admission.  Carotid artery ultrasound no significant ICA stenosis but revealed incomplete steal in right vertebral artery which suggests stenosis proximal subclavian or brachiocephalic arteries.  Echocardiogram showed no cardiac source of emboli.  LDL 111.  A1c 4.9.  Seen by neurology, with recommendation for aspirin and Plavix. --Continue aspirin, Plavix for 3 weeks then continue Plavix alone.  Continue statin. --Continue dysphagia 1 diet with thin liquids but oral intake very poor Met with palliative care, life expectation is less than 2 weeks given pretty much absence of p.o. intake now at this point.  Plan is for hospice facility, starting comfort care here.  Left wrist pain status post fall prior to admission --Repeat imaging this admission was negative for fracture or acute abnormality in the left wrist.  Extensive degenerative  changes were seen.  Left humerus film was negative.  Left hand film showed extensive degenerative changes.  No acute abnormality.  Left forearm film was negative.  Essential hypertension  Would not resume atenolol or Aricept given borderline bradycardia with these agents.  Sinus bradycardia --Atenolol held.  Donepezil discontinued.  Hypothyroidism Stopping medications for comfort care  Asymptomatic bacteriuria.  Urine culture noted.  Given lack of fever or symptoms, no treatment is indicated. --No treatment indicated.  PMH colon cancer  Late onset Alzheimer's dementia with behavioral disturbance --Appears stable.  She does awaken to voice and answer simple questions.  With bradycardia, stopped donezepil. --Mostly bedbound since hip fracture last year per chart  Circumferential wall thickening of the upper esophagus likely due to reflux esophagitis --No further evaluation suggested  Code Status: DNR/comfort care  Family Communication: Left message for son.  Disposition Plan: Hospice facility, bed pending   Consultants:  Hospice and palliative care  Neurology  Procedures:  Carotid ultrasound: Mild stenosis right ICA, mild stenosis left ICA.  Incomplete steal right vertebral artery suggest stenosis in more proximal subclavian or brachiocephalic arteries.  Antimicrobials:  None  DVT prophylaxis: Lovenox   Objective: Vitals:   07/04/19 1557 07/05/19 0556  BP: (!) 142/60 134/63  Pulse: 64 76  Resp: 18   Temp: 98.3 F (36.8 C) 98.7 F (37.1 C)  SpO2: 97% 95%    Intake/Output Summary (Last 24 hours) at 07/05/2019 1159 Last data filed at 07/05/2019 1049 Gross per 24 hour  Intake 243 ml  Output -  Net 243 ml   Filed Weights   06/22/19 1310  Weight: 68 kg   Body mass index is 24.96 kg/m.  Exam:   General: Resting comfortably, no acute distress  Data Reviewed: CBC: No results for input(s): WBC, NEUTROABS, HGB, HCT, MCV, PLT in the last  168 hours. Basic Metabolic Panel: Recent Labs  Lab 06/29/19 0510  CREATININE 0.81   GFR: Estimated Creatinine Clearance: 39 mL/min (by C-G formula based on SCr of 0.81 mg/dL). Liver Function Tests: No results for input(s): AST, ALT, ALKPHOS, BILITOT, PROT, ALBUMIN in the last 168 hours. No results for input(s): LIPASE, AMYLASE in the last 168 hours. No results for input(s): AMMONIA in the last 168 hours. Coagulation Profile: No results for input(s): INR, PROTIME in the last 168 hours. Cardiac Enzymes: No results for input(s): CKTOTAL, CKMB, CKMBINDEX, TROPONINI in the last 168 hours. BNP (last 3 results) No results for input(s): PROBNP in the last 8760 hours. HbA1C: No results for input(s): HGBA1C in the last 72 hours. CBG: No results for input(s): GLUCAP in the last 168 hours. Lipid Profile: No results for input(s): CHOL, HDL, LDLCALC, TRIG, CHOLHDL, LDLDIRECT in the last 72 hours. Thyroid Function Tests: No results for input(s): TSH, T4TOTAL, FREET4, T3FREE, THYROIDAB in the last 72 hours. Anemia Panel: No results for input(s): VITAMINB12, FOLATE, FERRITIN, TIBC, IRON, RETICCTPCT in the last 72 hours. Urine analysis:    Component Value Date/Time   COLORURINE AMBER (A) 06/23/2019 0539   APPEARANCEUR CLOUDY (A) 06/23/2019 0539   APPEARANCEUR Clear 12/23/2014 1349   LABSPEC 1.023 06/23/2019 0539   LABSPEC 1.009 12/23/2014 1349   PHURINE 6.0 06/23/2019 0539   GLUCOSEU NEGATIVE 06/23/2019 0539   GLUCOSEU Negative 12/23/2014 1349   HGBUR NEGATIVE 06/23/2019 0539   BILIRUBINUR NEGATIVE 06/23/2019 0539   BILIRUBINUR Negative 12/23/2014 1349   KETONESUR NEGATIVE 06/23/2019 0539   PROTEINUR 100 (A) 06/23/2019 0539   NITRITE POSITIVE (A) 06/23/2019 0539   LEUKOCYTESUR MODERATE (A) 06/23/2019 0539   LEUKOCYTESUR Trace 12/23/2014 1349   Sepsis Labs: '@LABRCNTIP'$ (procalcitonin:4,lacticidven:4)  ) Recent Results (from the past 240 hour(s))  SARS CORONAVIRUS 2 (TAT 6-24 HRS)  Nasopharyngeal Nasopharyngeal Swab     Status: None   Collection Time: 06/26/19  1:49 PM   Specimen: Nasopharyngeal Swab  Result Value Ref Range Status   SARS Coronavirus 2 NEGATIVE NEGATIVE Final    Comment: (NOTE) SARS-CoV-2 target nucleic acids are NOT DETECTED. The SARS-CoV-2 RNA is generally detectable in upper and lower respiratory specimens during the acute phase of infection. Negative results do not preclude SARS-CoV-2 infection, do not rule out co-infections with other pathogens, and should not be used as the sole basis for treatment or other patient management decisions. Negative results must be combined with clinical observations, patient history, and epidemiological information. The expected result is Negative. Fact Sheet for Patients: SugarRoll.be Fact Sheet for Healthcare Providers: https://www.woods-mathews.com/ This test is not yet approved or cleared by the [place] FDA and  has been authorized for detection and/or diagnosis of SARS-CoV-2 by FDA under an Emergency Use Authorization (EUA). This EUA will remain  in effect (meaning this test can be used) for the duration of the COVID-19 declaration under Section 56 4(b)(1) of the Act, 21 U.S.C. section 360bbb-3(b)(1), unless the authorization is terminated or revoked sooner. Performed at [facility], [place] [address], [place], [place] [address]       Studies: No results found.  Scheduled Meds: . busPIRone  15 mg Oral BID  . feeding supplement (ENSURE ENLIVE)  237 mL Oral Q24H  . fentaNYL  1 patch Transdermal Q72H  . levothyroxine  50 mcg Oral QAC breakfast  . mirtazapine  30 mg Oral QHS  .  pantoprazole  40 mg Oral Daily  . senna-docusate  1 tablet Oral BID  . sertraline  25 mg Oral Daily  . sodium chloride flush  3 mL Intravenous Q12H    Continuous Infusions: . sodium chloride       LOS: 12 days     Annita Brod, MD [facility]  To  reach me or the doctor on call, go to: [email or web address] Password [email or web address]  07/05/2019, 11:59 AM

## 2019-07-05 NOTE — Plan of Care (Signed)
  Problem: Education: Goal: Knowledge of disease or condition will improve Outcome: Progressing Goal: Knowledge of secondary prevention will improve Outcome: Progressing Goal: Knowledge of patient specific risk factors addressed and post discharge goals established will improve Outcome: Progressing   Problem: Coping: Goal: Will verbalize positive feelings about self Outcome: Progressing Goal: Will identify appropriate support needs Outcome: Progressing   Problem: Health Behavior/Discharge Planning: Goal: Ability to manage health-related needs will improve Outcome: Progressing   Problem: Self-Care: Goal: Ability to participate in self-care as condition permits will improve Outcome: Progressing Goal: Verbalization of feelings and concerns over difficulty with self-care will improve Outcome: Progressing Goal: Ability to communicate needs accurately will improve Outcome: Progressing   Problem: Nutrition: Goal: Risk of aspiration will decrease Outcome: Progressing   Problem: Ischemic Stroke/TIA Tissue Perfusion: Goal: Complications of ischemic stroke/TIA will be minimized Outcome: Progressing   Problem: Health Behavior/Discharge Planning: Goal: Ability to manage health-related needs will improve Outcome: Progressing   Problem: Clinical Measurements: Goal: Ability to maintain clinical measurements within normal limits will improve Outcome: Progressing Goal: Will remain free from infection Outcome: Progressing Goal: Diagnostic test results will improve Outcome: Progressing   Problem: Activity: Goal: Risk for activity intolerance will decrease Outcome: Progressing   Problem: Coping: Goal: Level of anxiety will decrease Outcome: Progressing   Problem: Elimination: Goal: Will not experience complications related to bowel motility Outcome: Progressing Goal: Will not experience complications related to urinary retention Outcome: Progressing   Problem: Pain  Managment: Goal: General experience of comfort will improve Outcome: Progressing   Problem: Safety: Goal: Ability to remain free from injury will improve Outcome: Progressing   Problem: Skin Integrity: Goal: Risk for impaired skin integrity will decrease Outcome: Progressing   Problem: Education: Goal: Knowledge of General Education information will improve Description: Including pain rating scale, medication(s)/side effects and non-pharmacologic comfort measures Outcome: Progressing   Problem: Pain Managment: Goal: General experience of comfort will improve Outcome: Progressing   Problem: Safety: Goal: Ability to remain free from injury will improve Outcome: Progressing   Problem: Education: Goal: Knowledge of the prescribed therapeutic regimen will improve Outcome: Progressing   Problem: Coping: Goal: Ability to identify and develop effective coping behavior will improve Outcome: Progressing   Problem: Clinical Measurements: Goal: Quality of life will improve Outcome: Progressing   Problem: Respiratory: Goal: Verbalizations of increased ease of respirations will increase Outcome: Progressing   Problem: Role Relationship: Goal: Family's ability to cope with current situation will improve Outcome: Progressing Goal: Ability to verbalize concerns, feelings, and thoughts to partner or family member will improve Outcome: Progressing   Problem: Pain Management: Goal: Satisfaction with pain management regimen will improve Outcome: Progressing

## 2019-07-06 MED ORDER — SERTRALINE HCL 50 MG PO TABS
25.0000 mg | ORAL_TABLET | Freq: Every day | ORAL | Status: DC
  Administered 2019-07-06 – 2019-07-10 (×5): 25 mg via ORAL
  Filled 2019-07-06 (×5): qty 1

## 2019-07-06 MED ORDER — HYDRALAZINE HCL 10 MG PO TABS
10.0000 mg | ORAL_TABLET | Freq: Three times a day (TID) | ORAL | Status: DC
  Administered 2019-07-06 – 2019-07-07 (×5): 10 mg via ORAL
  Filled 2019-07-06 (×9): qty 1

## 2019-07-06 MED ORDER — BUSPIRONE HCL 10 MG PO TABS
15.0000 mg | ORAL_TABLET | Freq: Two times a day (BID) | ORAL | Status: DC
  Administered 2019-07-06 – 2019-07-10 (×9): 15 mg via ORAL
  Filled 2019-07-06 (×9): qty 2

## 2019-07-06 NOTE — Care Management Important Message (Signed)
Important Message  Patient Details  Name: Kerri Rangel MRN: [id number] Date of Birth: [birth year]   Medicare Important Message Given:  Yes  Son, Jenai Buckelew was in room and reviewed Important Message from Medicare with him and his mother.   Juliann Pulse A Alvar Malinoski 07/06/2019, 12:34 PM

## 2019-07-06 NOTE — Progress Notes (Deleted)
Patient left the hospital and was found coming back from the parking lot with her visitor, Oncology director LaDonna McFarland notified after patient was returned to room and discussed incident with patient.

## 2019-07-06 NOTE — Progress Notes (Signed)
PROGRESS NOTE  Kerri Rangel JSH:[id number] DOB: 13-Oct-1923 DOA: 06/22/2019 PCP: [facility]  HPI/Recap of past 100 hours: 85 year old woman PMH colon cancer, dementia, anxiety, depression, hypothyroidism presented with left upper extremity weakness.  Had recently been seen in the ED for superficial skin tear left side.  CT head suggested possible right parietal infarct.  MRI showed large acute posterior right MCA territory infarct without hemorrhage.  Neurology with recommendations for aspirin and Plavix for 3 weeks then Plavix alone.  SNF recommended but insurance company denied rehab.  10/31 attending physician discussed peer to peer and insurance authorization denial was upheld.  Oral intakes remain poor and participation is limited.  Palliative medicine involved.    After much discussion, we will start comfort care here and then transfer to hospice facility.  Patient more awake today, slightly interactive. No acute distress or complaints  Assessment/Plan: Large acute posterior right MCA infarct without hemorrhage.  Etiology likely embolic.  On aspirin prior to admission.  Carotid artery ultrasound no significant ICA stenosis but revealed incomplete steal in right vertebral artery which suggests stenosis proximal subclavian or brachiocephalic arteries.  Echocardiogram showed no cardiac source of emboli.  LDL 111.  A1c 4.9.  Seen by neurology, with recommendation for aspirin and Plavix. --Continue aspirin, Plavix for 3 weeks then continue Plavix alone.  Continue statin. --Continue dysphagia 1 diet with thin liquids but oral intake very poor Met with palliative care, initial life expectation is less than 2 weeks given pretty much absence of p.o. intake now at that point.  Plan was for hospice facility, starting comfort care here. However, patient has started to take p.o., be more alert. Now not candidate for hospice facility. We'll potentially look at skilled nursing facility with  hospice.  Left wrist pain status post fall prior to admission --Repeat imaging this admission was negative for fracture or acute abnormality in the left wrist.  Extensive degenerative changes were seen.  Left humerus film was negative.  Left hand film showed extensive degenerative changes.  No acute abnormality.  Left forearm film was negative.  Essential hypertension  Would not resume atenolol or Aricept given borderline bradycardia with these agents.  Sinus bradycardia --Atenolol held.  Donepezil discontinued.  Hypothyroidism Continuing her Synthroid.  Asymptomatic bacteriuria.  Urine culture noted.  Given lack of fever or symptoms, no treatment is indicated. --No treatment indicated.  PMH colon cancer  Late onset Alzheimer's dementia with behavioral disturbance --Appears stable.  She does awaken to voice and answer simple questions.  With bradycardia, stopped donezepil. --Mostly bedbound since hip fracture last year per chart  Circumferential wall thickening of the upper esophagus likely due to reflux esophagitis --No further evaluation suggested  Code Status: DNR/comfort care  Family Communication: Left message for son.  Disposition Plan: Skilled nursing with hospice   Consultants:  Hospice and palliative care  Neurology  Procedures:  Carotid ultrasound: Mild stenosis right ICA, mild stenosis left ICA.  Incomplete steal right vertebral artery suggest stenosis in more proximal subclavian or brachiocephalic arteries.  Antimicrobials:  None  DVT prophylaxis: Lovenox   Objective: Vitals:   07/06/19 0523 07/06/19 0817  BP: (!) 141/72 (!) 169/63  Pulse: (!) 58 (!) 54  Resp:  19  Temp: 97.8 F (36.6 C) 97.7 F (36.5 C)  SpO2: 98% 96%    Intake/Output Summary (Last 24 hours) at 07/06/2019 1402 Last data filed at 07/06/2019 0950 Gross per 24 hour  Intake 479 ml  Output -  Net 479 ml  Filed Weights   06/22/19 1310  Weight: 68 kg   Body mass  index is 24.96 kg/m.  Exam:   General: Oriented x1, no acute distress  Cardiovascular: Regular rate and rhythm, S1-S2, borderline bradycardia  Lungs: Clear to auscultation bilaterally    Data Reviewed: CBC: No results for input(s): WBC, NEUTROABS, HGB, HCT, MCV, PLT in the last 168 hours. Basic Metabolic Panel: No results for input(s): NA, K, CL, CO2, GLUCOSE, BUN, CREATININE, CALCIUM, MG, PHOS in the last 168 hours. GFR: Estimated Creatinine Clearance: 39 mL/min (by C-G formula based on SCr of 0.81 mg/dL). Liver Function Tests: No results for input(s): AST, ALT, ALKPHOS, BILITOT, PROT, ALBUMIN in the last 168 hours. No results for input(s): LIPASE, AMYLASE in the last 168 hours. No results for input(s): AMMONIA in the last 168 hours. Coagulation Profile: No results for input(s): INR, PROTIME in the last 168 hours. Cardiac Enzymes: No results for input(s): CKTOTAL, CKMB, CKMBINDEX, TROPONINI in the last 168 hours. BNP (last 3 results) No results for input(s): PROBNP in the last 8760 hours. HbA1C: No results for input(s): HGBA1C in the last 72 hours. CBG: No results for input(s): GLUCAP in the last 168 hours. Lipid Profile: No results for input(s): CHOL, HDL, LDLCALC, TRIG, CHOLHDL, LDLDIRECT in the last 72 hours. Thyroid Function Tests: No results for input(s): TSH, T4TOTAL, FREET4, T3FREE, THYROIDAB in the last 72 hours. Anemia Panel: No results for input(s): VITAMINB12, FOLATE, FERRITIN, TIBC, IRON, RETICCTPCT in the last 72 hours. Urine analysis:    Component Value Date/Time   COLORURINE AMBER (A) 06/23/2019 0539   APPEARANCEUR CLOUDY (A) 06/23/2019 0539   APPEARANCEUR Clear 12/23/2014 1349   LABSPEC 1.023 06/23/2019 0539   LABSPEC 1.009 12/23/2014 1349   PHURINE 6.0 06/23/2019 0539   GLUCOSEU NEGATIVE 06/23/2019 0539   GLUCOSEU Negative 12/23/2014 1349   HGBUR NEGATIVE 06/23/2019 0539   BILIRUBINUR NEGATIVE 06/23/2019 0539   BILIRUBINUR Negative 12/23/2014  1349   KETONESUR NEGATIVE 06/23/2019 0539   PROTEINUR 100 (A) 06/23/2019 0539   NITRITE POSITIVE (A) 06/23/2019 0539   LEUKOCYTESUR MODERATE (A) 06/23/2019 0539   LEUKOCYTESUR Trace 12/23/2014 1349   Sepsis Labs: '@LABRCNTIP'$ (procalcitonin:4,lacticidven:4)  ) No results found for this or any previous visit (from the past 240 hour(s)).    Studies: No results found.  Scheduled Meds: . busPIRone  15 mg Oral BID  . feeding supplement (ENSURE ENLIVE)  237 mL Oral Q24H  . fentaNYL  1 patch Transdermal Q72H  . hydrALAZINE  10 mg Oral Q8H  . levothyroxine  50 mcg Oral QAC breakfast  . mirtazapine  30 mg Oral QHS  . sertraline  25 mg Oral Daily  . sodium chloride flush  3 mL Intravenous Q12H    Continuous Infusions: . sodium chloride       LOS: 13 days     Annita Brod, MD [facility]  To reach me or the doctor on call, go to: [email or web address] Password [email or web address]  07/06/2019, 2:02 PM

## 2019-07-06 NOTE — Plan of Care (Signed)
  Problem: Education: Goal: Knowledge of disease or condition will improve Outcome: Progressing Goal: Knowledge of secondary prevention will improve Outcome: Progressing Goal: Knowledge of patient specific risk factors addressed and post discharge goals established will improve Outcome: Progressing   Problem: Coping: Goal: Will verbalize positive feelings about self Outcome: Progressing Goal: Will identify appropriate support needs Outcome: Progressing   Problem: Health Behavior/Discharge Planning: Goal: Ability to manage health-related needs will improve Outcome: Progressing   Problem: Self-Care: Goal: Ability to participate in self-care as condition permits will improve Outcome: Progressing Goal: Verbalization of feelings and concerns over difficulty with self-care will improve Outcome: Progressing Goal: Ability to communicate needs accurately will improve Outcome: Progressing   Problem: Nutrition: Goal: Risk of aspiration will decrease Outcome: Progressing   Problem: Ischemic Stroke/TIA Tissue Perfusion: Goal: Complications of ischemic stroke/TIA will be minimized Outcome: Progressing   Problem: Health Behavior/Discharge Planning: Goal: Ability to manage health-related needs will improve Outcome: Progressing   Problem: Clinical Measurements: Goal: Ability to maintain clinical measurements within normal limits will improve Outcome: Progressing Goal: Will remain free from infection Outcome: Progressing Goal: Diagnostic test results will improve Outcome: Progressing   Problem: Activity: Goal: Risk for activity intolerance will decrease Outcome: Progressing   Problem: Coping: Goal: Level of anxiety will decrease Outcome: Progressing   Problem: Elimination: Goal: Will not experience complications related to bowel motility Outcome: Progressing Goal: Will not experience complications related to urinary retention Outcome: Progressing   Problem: Pain  Managment: Goal: General experience of comfort will improve Outcome: Progressing   Problem: Safety: Goal: Ability to remain free from injury will improve Outcome: Progressing   Problem: Skin Integrity: Goal: Risk for impaired skin integrity will decrease Outcome: Progressing   Problem: Education: Goal: Knowledge of General Education information will improve Description: Including pain rating scale, medication(s)/side effects and non-pharmacologic comfort measures Outcome: Progressing   Problem: Pain Managment: Goal: General experience of comfort will improve Outcome: Progressing   Problem: Safety: Goal: Ability to remain free from injury will improve Outcome: Progressing   Problem: Education: Goal: Knowledge of the prescribed therapeutic regimen will improve Outcome: Progressing   Problem: Coping: Goal: Ability to identify and develop effective coping behavior will improve Outcome: Progressing   Problem: Clinical Measurements: Goal: Quality of life will improve Outcome: Progressing   Problem: Respiratory: Goal: Verbalizations of increased ease of respirations will increase Outcome: Progressing   Problem: Role Relationship: Goal: Family's ability to cope with current situation will improve Outcome: Progressing Goal: Ability to verbalize concerns, feelings, and thoughts to partner or family member will improve Outcome: Progressing   Problem: Pain Management: Goal: Satisfaction with pain management regimen will improve Outcome: Progressing

## 2019-07-06 NOTE — Progress Notes (Signed)
Follow up visit and phone call made to new referral to [facility] home. Writer first spoke via telephone to patient's daughter Lavonna Rua, who reported that her mother continues to eat well and wondered about her appropriateness for the hospice home. Writer did readdress that the hospice home is for a 2 week or less prognosis. [name] feels that her mother has made improvements and would very much like her to return to [facility] with the support of hospice services. Writer visited with patient and she was verbal and interactive, asking for her family. Student nurses present giving a bath and report that patient did take several bites of breakfast and drank well. Writer then spoke with patient's son Carloyn Manner, who was next at the visit, he voice understanding that the plan for the hospice home may not be appropriate at this time. Currently patient's referral for the hospice home is on hold. Both attending physician Dr. Maryland Pink and CMRN Doran Clay made aware. Will continue to follow. Referral updated. Flo Shanks BSN, RN, [place] [phone]

## 2019-07-07 NOTE — Progress Notes (Signed)
Visit made,  patient seen lying in bed, appeared comfortable. Breakfast tray at bedside, patient ate all of her pancakes and took oral morning medications. Writer spoke again with patient's daughter [name] last evening, she continues to be encouraged by her mother's apparent improvement. Discussed at length with CMRN Doran Clay, who continues to work on a discharge plan. Patient may benefit for a current PT evaluation. Patient at this time, does not appear to have a 2 week or less prognosis. She would be appropriate for hospice services at a SNF or at home. Will continue to follow and support patient and her family. Flo Shanks BSN ,[place] [phone]

## 2019-07-07 NOTE — Progress Notes (Signed)
PROGRESS NOTE  Kerri Rangel GYJ:[id number] DOB: [birth year] DOA: 06/22/2019 PCP: [facility]  HPI/Recap of past 4 hours: 85 year old woman PMH colon cancer, dementia, anxiety, depression, hypothyroidism presented with left upper extremity weakness.  Had recently been seen in the ED for superficial skin tear left side.  CT head suggested possible right parietal infarct.  MRI showed large acute posterior right MCA territory infarct without hemorrhage.  Neurology with recommendations for aspirin and Plavix for 3 weeks then Plavix alone.  SNF recommended but insurance company denied rehab.  10/31 attending physician discussed peer to peer and insurance authorization denial was upheld.  Oral intakes remain poor and participation is limited.  Palliative medicine involved.    After much discussion, initial plan was to start comfort care here and then transfer to hospice facility.  Patient however since has started to improve clinically and felt to now no longer be a candidate for hospice given her life expectancy expected to exceed past 2 weeks.  Patient more awake today, slightly interactive. No acute distress or complaints  Assessment/Plan: Large acute posterior right MCA infarct without hemorrhage.  Etiology likely embolic.  On aspirin prior to admission.  Carotid artery ultrasound no significant ICA stenosis but revealed incomplete steal in right vertebral artery which suggests stenosis proximal subclavian or brachiocephalic arteries.  Echocardiogram showed no cardiac source of emboli.  LDL 111.  A1c 4.9.  Seen by neurology, with recommendation for aspirin and Plavix. --Continue aspirin, Plavix for 3 weeks then continue Plavix alone.  Continue statin. --Continue dysphagia 1 diet with thin liquids but oral intake very poor Met with palliative care, initial life expectation is less than 2 weeks given pretty much absence of p.o. intake now at that point.  Plan was for hospice facility,  starting comfort care here. However, patient has started to take p.o., be more alert. Now not candidate for hospice facility. We'll potentially look at skilled nursing facility with hospice.  Left wrist pain status post fall prior to admission --Repeat imaging this admission was negative for fracture or acute abnormality in the left wrist.  Extensive degenerative changes were seen.  Left humerus film was negative.  Left hand film showed extensive degenerative changes.  No acute abnormality.  Left forearm film was negative.  Essential hypertension  Would not resume atenolol or Aricept given borderline bradycardia with these agents.  Sinus bradycardia --Atenolol held.  Donepezil discontinued.  Hypothyroidism Continuing her Synthroid.  Asymptomatic bacteriuria.  Urine culture noted.  Given lack of fever or symptoms, no treatment is indicated. --No treatment indicated.  PMH colon cancer  Late onset Alzheimer's dementia with behavioral disturbance --Appears stable.  She does awaken to voice and answer simple questions.  With bradycardia, stopped donezepil. --Mostly bedbound since hip fracture last year per chart  Circumferential wall thickening of the upper esophagus likely due to reflux esophagitis --No further evaluation suggested  Code Status: DNR/comfort care  Family Communication: Left message for son.  Disposition Plan: Skilled nursing possibly with hospice   Consultants:  Hospice and palliative care  Neurology  Procedures:  Carotid ultrasound: Mild stenosis right ICA, mild stenosis left ICA.  Incomplete steal right vertebral artery suggest stenosis in more proximal subclavian or brachiocephalic arteries.  Antimicrobials:  None  DVT prophylaxis: Lovenox   Objective: Vitals:   07/07/19 0748 07/07/19 0755  BP: (!) 146/65   Pulse: (!) 57 61  Resp:    Temp: 97.9 F (36.6 C)   SpO2: 100%     Intake/Output Summary (  Last 24 hours) at 07/07/2019 1310  Last data filed at 07/07/2019 0937 Gross per 24 hour  Intake 240 ml  Output -  Net 240 ml   Filed Weights   06/22/19 1310  Weight: 68 kg   Body mass index is 24.96 kg/m.  Exam: No change from previous day  General: Oriented x1, no acute distress  Cardiovascular: Regular rate and rhythm, S1-S2, borderline bradycardia  Lungs: Clear to auscultation bilaterally    Data Reviewed: CBC: No results for input(s): WBC, NEUTROABS, HGB, HCT, MCV, PLT in the last 168 hours. Basic Metabolic Panel: No results for input(s): NA, K, CL, CO2, GLUCOSE, BUN, CREATININE, CALCIUM, MG, PHOS in the last 168 hours. GFR: Estimated Creatinine Clearance: 39 mL/min (by C-G formula based on SCr of 0.81 mg/dL). Liver Function Tests: No results for input(s): AST, ALT, ALKPHOS, BILITOT, PROT, ALBUMIN in the last 168 hours. No results for input(s): LIPASE, AMYLASE in the last 168 hours. No results for input(s): AMMONIA in the last 168 hours. Coagulation Profile: No results for input(s): INR, PROTIME in the last 168 hours. Cardiac Enzymes: No results for input(s): CKTOTAL, CKMB, CKMBINDEX, TROPONINI in the last 168 hours. BNP (last 3 results) No results for input(s): PROBNP in the last 8760 hours. HbA1C: No results for input(s): HGBA1C in the last 72 hours. CBG: No results for input(s): GLUCAP in the last 168 hours. Lipid Profile: No results for input(s): CHOL, HDL, LDLCALC, TRIG, CHOLHDL, LDLDIRECT in the last 72 hours. Thyroid Function Tests: No results for input(s): TSH, T4TOTAL, FREET4, T3FREE, THYROIDAB in the last 72 hours. Anemia Panel: No results for input(s): VITAMINB12, FOLATE, FERRITIN, TIBC, IRON, RETICCTPCT in the last 72 hours. Urine analysis:    Component Value Date/Time   COLORURINE AMBER (A) 06/23/2019 0539   APPEARANCEUR CLOUDY (A) 06/23/2019 0539   APPEARANCEUR Clear 12/23/2014 1349   LABSPEC 1.023 06/23/2019 0539   LABSPEC 1.009 12/23/2014 1349   PHURINE 6.0 06/23/2019  0539   GLUCOSEU NEGATIVE 06/23/2019 0539   GLUCOSEU Negative 12/23/2014 1349   HGBUR NEGATIVE 06/23/2019 0539   BILIRUBINUR NEGATIVE 06/23/2019 0539   BILIRUBINUR Negative 12/23/2014 1349   KETONESUR NEGATIVE 06/23/2019 0539   PROTEINUR 100 (A) 06/23/2019 0539   NITRITE POSITIVE (A) 06/23/2019 0539   LEUKOCYTESUR MODERATE (A) 06/23/2019 0539   LEUKOCYTESUR Trace 12/23/2014 1349   Sepsis Labs: '@LABRCNTIP'$ (procalcitonin:4,lacticidven:4)  ) No results found for this or any previous visit (from the past 240 hour(s)).    Studies: No results found.  Scheduled Meds: . busPIRone  15 mg Oral BID  . feeding supplement (ENSURE ENLIVE)  237 mL Oral Q24H  . fentaNYL  1 patch Transdermal Q72H  . hydrALAZINE  10 mg Oral Q8H  . levothyroxine  50 mcg Oral QAC breakfast  . mirtazapine  30 mg Oral QHS  . sertraline  25 mg Oral Daily  . sodium chloride flush  3 mL Intravenous Q12H    Continuous Infusions: . sodium chloride       LOS: 14 days     Annita Brod, MD [facility]  To reach me or the doctor on call, go to: [email or web address] Password [email or web address]  07/07/2019, 1:10 PM

## 2019-07-07 NOTE — Progress Notes (Signed)
Nutrition Follow-up  DOCUMENTATION CODES:   Not applicable  INTERVENTION:  Continue Ensure Enlive po once daily, each supplement provides 350 kcal and 20 grams of protein.  NUTRITION DIAGNOSIS:   Biting/chewing difficulty related to (loose fitting dentures) as evidenced by (per chart, need for dysphagia 1 diet.).  Ongoing.  GOAL:   Patient will meet greater than or equal to 90% of their needs  Progressing.  MONITOR:   PO intake, Supplement acceptance, Labs, Weight trends, Skin, I & O's  REASON FOR ASSESSMENT:   Malnutrition Screening Tool    ASSESSMENT:   85 year old female with PMHx of HTN, hypothyroidism, Alzheimer's disease, anxiety, depression, HLD, vitamin D deficiency, GERD admitted with CVA.  Patient sleeping at time of RD assessment. RN reports appetite has been improving some. She ate lunch with her son today. According to chart she ate all of her pancakes at breakfast this morning. Per chart she ate 60% of her lunch yesterday and 80% of her dinner last night. She has also been drinking Ensure. Per review of chart patient had been transitioned to comfort care and plan was for transfer to hospice home but patient began to improve and is no longer appropriate for hospice home.  Medications reviewed and include: fentanyl patch, levothyroxine, Remeron, sertraline.  Labs reviewed: chloride 116, Anion gap 3.  Diet Order:   Diet Order            DIET - DYS 1 Room service appropriate? Yes with Assist; Fluid consistency: Thin  Diet effective now             EDUCATION NEEDS:   No education needs have been identified at this time  Skin:  Skin Assessment: Skin Integrity Issues:(MSAD to perineum; ecchymosis)  Last BM:  07/01/2019 - medium type 7  Height:   Ht Readings from Last 1 Encounters:  06/22/19 5\' 5"  (1.651 m)   Weight:   Wt Readings from Last 1 Encounters:  06/22/19 68 kg   Ideal Body Weight:  56.8 kg  BMI:  Body mass index is 24.96  kg/m.  Estimated Nutritional Needs:   Kcal:  1400-1600  Protein:  70-80 grams  Fluid:  1.4-1.6 L/day  Jacklynn Barnacle, MS, RD, LDN Office: [phone] Pager: [phone] After Hours/Weekend Pager: [phone]

## 2019-07-08 DIAGNOSIS — F0390 Unspecified dementia without behavioral disturbance: Secondary | ICD-10-CM

## 2019-07-08 MED ORDER — SENNOSIDES-DOCUSATE SODIUM 8.6-50 MG PO TABS
2.0000 | ORAL_TABLET | Freq: Once | ORAL | Status: AC
  Administered 2019-07-08: 10:00:00 2 via ORAL
  Filled 2019-07-08: qty 2

## 2019-07-08 MED ORDER — ATORVASTATIN CALCIUM 20 MG PO TABS
40.0000 mg | ORAL_TABLET | Freq: Every day | ORAL | Status: DC
  Administered 2019-07-08 – 2019-07-09 (×2): 40 mg via ORAL
  Filled 2019-07-08 (×2): qty 2

## 2019-07-08 MED ORDER — HYDRALAZINE HCL 10 MG PO TABS
5.0000 mg | ORAL_TABLET | Freq: Three times a day (TID) | ORAL | Status: DC
  Administered 2019-07-08 – 2019-07-10 (×5): 5 mg via ORAL
  Filled 2019-07-08 (×8): qty 1

## 2019-07-08 MED ORDER — CLOPIDOGREL BISULFATE 75 MG PO TABS
75.0000 mg | ORAL_TABLET | Freq: Every day | ORAL | Status: DC
  Administered 2019-07-08 – 2019-07-10 (×3): 75 mg via ORAL
  Filled 2019-07-08 (×2): qty 1

## 2019-07-08 MED ORDER — ASPIRIN 81 MG PO CHEW
81.0000 mg | CHEWABLE_TABLET | Freq: Every day | ORAL | Status: DC
  Administered 2019-07-08 – 2019-07-10 (×3): 81 mg via ORAL
  Filled 2019-07-08 (×2): qty 1

## 2019-07-08 NOTE — Evaluation (Signed)
Physical Therapy Re-Evaluation Patient Details Name: Kerri Rangel MRN: [id number] DOB: 07/01/1924 Today's Date: 07/08/2019   History of Present Illness  pt is a 85 yo female admitted for stroke work up after presenting to ED from [place] with left arm weakness, head CT showed possible sided parietal infarct. PMH: colon cancer, anxiety, depression, dementia, HTN, HLD, hypothyroidism. PT orders initially discontinued due to patient transitioning to comfort care, pt has recently improved medically and a PT re-evaluation has been requested.    Clinical Impression  The patient was able to keep eyes open on command (intermittently took several attempts but pt did ultimately open eyes). Oriented to her name, denied pain but exhibited pain signs/symptoms with LUE and LLE movement. Pt with difficulty understanding tasks of exercises, but with tactile and step by step verbal cues, participated in limited AAROM exercises of RUE and RLE. Pt displayed no active movements of LUE or LLE during re-evaluation. Pt with BM; PT and CNA to clean and reposition. The pt was able to roll to R with maxA minA to maintain position, and was able to roll to L with modA, and maintain sidelying with supervision on the L. Able to return to supine minA. The patient demonstrated improved ability to participate with therapy compared to previous sessions, and would benefit from further skilled PT intervention for continued assessment and to maximize mobility, safety, and independence.     Follow Up Recommendations SNF    Equipment Recommendations  Other (comment)    Recommendations for Other Services       Precautions / Restrictions Precautions Precautions: Fall Precaution Comments: support LUE/LLE Restrictions Weight Bearing Restrictions: No      Mobility  Bed Mobility Overal bed mobility: Needs Assistance Bed Mobility: Rolling Rolling: Mod assist;Min assist         General bed mobility comments: Pt was able to  roll to R with maxA, able to roll to L with modA, and maintain sidelying with supervision on the L. Able to return to supine minA  Transfers                 General transfer comment: deferred  Ambulation/Gait                Stairs            Wheelchair Mobility    Modified Rankin (Stroke Patients Only)       Balance                                             Pertinent Vitals/Pain Pain Assessment: Faces Faces Pain Scale: Hurts little more Pain Location: L UE - pt groans and cries out with minimal LUE movement. Pain Descriptors / Indicators: Grimacing;Guarding;Moaning Pain Intervention(s): Limited activity within patient's tolerance;Monitored during session;Repositioned    Home Living Family/patient expects to be discharged to:: Assisted living(Per chart, Pt resident at [facility])                      Prior Function Level of Independence: Needs assistance         Comments: pt is poor historian per chart review with noted cognitive impairments at baseline, unclear how much assist she requires at baseline. Case management indicates the patient primarily utilized [facility] for mobility. Unsure of level of assistance needed for transfers.     Hand Dominance  Dominant Hand: Left(Per pt.)    Extremity/Trunk Assessment   Upper Extremity Assessment RUE Deficits / Details: Generally weak but WFL. LUE Deficits / Details: unable to fully assess secondary to severe pain, LUE mostly flaccid LUE Coordination: decreased gross motor;decreased fine motor    Lower Extremity Assessment Lower Extremity Assessment: RLE deficits/detail;LLE deficits/detail RLE Deficits / Details: AAROM to mobilize RLE LLE Deficits / Details: pt with very minimal muscle activation noted with knee flexion. unable to wiggle toes/unable to follow command       Communication   Communication: No difficulties  Cognition Arousal/Alertness:  Lethargic Behavior During Therapy: WFL for tasks assessed/performed Overall Cognitive Status: No family/caregiver present to determine baseline cognitive functioning                                        General Comments      Exercises General Exercises - Upper Extremity Shoulder Flexion: AAROM;Right;5 reps;PROM;Left General Exercises - Lower Extremity Heel Slides: AAROM;Right;5 reps;PROM Other Exercises Other Exercises: PT assisted CNA with cleaning pt after BM.   Assessment/Plan    PT Assessment Patient needs continued PT services  PT Problem List Decreased strength;Decreased safety awareness;Decreased mobility;Decreased range of motion;Decreased activity tolerance;Decreased balance;Pain;Decreased knowledge of use of DME       PT Treatment Interventions DME instruction;Therapeutic exercise;Gait training;Balance training;Stair training;Neuromuscular re-education;Functional mobility training;Therapeutic activities;Patient/family education;Cognitive remediation    PT Goals (Current goals can be found in the Care Plan section)  Acute Rehab PT Goals PT Goal Formulation: Patient unable to participate in goal setting Time For Goal Achievement: 07/22/19 Potential to Achieve Goals: Fair    Frequency Min 2X/week   Barriers to discharge        Co-evaluation               AM-PAC PT "6 Clicks" Mobility  Outcome Measure Help needed turning from your back to your side while in a flat bed without using bedrails?: Total Help needed moving from lying on your back to sitting on the side of a flat bed without using bedrails?: Total Help needed moving to and from a bed to a chair (including a wheelchair)?: Total Help needed standing up from a chair using your arms (e.g., wheelchair or bedside chair)?: Total Help needed to walk in hospital room?: Total Help needed climbing 3-5 steps with a railing? : Total 6 Click Score: 6    End of Session   Activity Tolerance:  Patient limited by fatigue Patient left: in bed;with call bell/phone within reach;with bed alarm set Nurse Communication: Mobility status PT Visit Diagnosis: Muscle weakness (generalized) (M62.81) Pain - Right/Left: Left Pain - part of body: Leg;Arm;Hand    Time: [id number] PT Time Calculation (min) (ACUTE ONLY): 18 min   Charges:   PT Evaluation $PT Re-evaluation: 1 Re-eval PT Treatments $Therapeutic Exercise: 8-22 mins       Lieutenant Diego PT, DPT 3:32 PM,07/08/19 [phone]

## 2019-07-08 NOTE — TOC Progression Note (Signed)
Transition of Care [facility]) - Progression Note    Patient Details  Name: Kerri Rangel MRN: [id number] Date of Birth: Mar 29, 1924  Transition of Care [facility]) CM/SW Contact  Shelbie Hutching, RN Phone Number: 07/08/2019, 2:08 PM  Clinical Narrative:    Patient does not currently qualify to go to residential hospice.  Family would like for the patient to be able to return to [facility] with hospice care following.  [facility] will consider accepting patient back they would just like to come and assess patient first to ensure that they can meet her needs given that she will require higher level of care than when she left.  Patient will also need a hospital bed if she returns to [facility].  Representative from [facility] will come in the morning to assess patient.    Expected Discharge Plan: Assisted Living Barriers to Discharge: Continued Medical Work up  Expected Discharge Plan and Services Expected Discharge Plan: Assisted Living   Discharge Planning Services: CM Consult   Living arrangements for the past 2 months: Assisted Living Facility(Memory Care)                                       Social Determinants of Health (SDOH) Interventions    Readmission Risk Interventions No flowsheet data found.

## 2019-07-08 NOTE — Progress Notes (Signed)
Patients blood pressure was 127/59 and HR is 62 she had scheduled Hydralazine, messaged Dr. Cruzita Lederer to see if he wanted me to still give it and he told me to hold for now. Patient also has not had a documented BM since 10/6, Dr. Cruzita Lederer also ordered Senna for same.

## 2019-07-08 NOTE — Progress Notes (Addendum)
PROGRESS NOTE  Kerri Rangel [id number] DOB: 03/03/1924 DOA: 06/22/2019 PCP: [facility]   LOS: 15 days   Brief Narrative / Interim history: 85 year old woman with history of colon cancer, dementia, depression, hypothyroidism, was admitted to the hospital on 06/22/2019 with left upper extremity weakness.  The CT of the head in the ED suggested possible right parietal infarct, and this was followed by an MRI which proved a large acute posterior right MCA territory infarct without hemorrhage.  Neurology consulted patient followed while hospitalized, recommending eventually aspirin and Plavix for 3 weeks alone then followed by Plavix alone.  Physical therapy recommended SNF however insurance company denied rehab, even after peer to peer was held by the attending physician on 10/31.  Palliative care medicine involved in her care.  Initial plan was to start comfort care here and then transferred to hospice facility, however she started to improve clinically and felt not to be a candidate for hospice given life expectancy expected to exceed past 2 weeks  Subjective / 24h Interval events: She is sleeping when I entered the room, does wake up to her name and answers basic questions.  She is quite confused.  Assessment & Plan: Principal Problem:   CVA (cerebral vascular accident) [facility]) Active Problems:   Hypothyroidism   Late onset Alzheimer's disease with behavioral disturbance ([place])   Dementia ([place])   Stroke [facility])   Essential hypertension   Principal Problem Large acute posterior right MCA infarct without hemorrhage -Likely embolic, neurology consulted and followed patient while hospitalized.  She underwent a carotid artery ultrasound without significant ICA stenosis but did reveal an incomplete still in the right vertebral artery which suggest stenosis proximal subclavian or brachiocephalic arteries.  She underwent a 2D echo which showed no cardiac source of emboli, full  report below.  LDL was 111, A1c 4.9.  Recommendations were for aspirin and Plavix for 3 weeks then continue Plavix alone.  She is already on statin -When she deteriorated her oral agents were discontinued, now that she is improving we will readd her Plavix and aspirin -She has poor p.o. intake, continue dysphagia 1 diet -Insurance denied SNF, not a candidate for hospice facility.  PT to reevaluate -Her assisted living facility will reevaluate patient tomorrow I have also discussed with patient's daughter and son in a conference call they agree for her to go back to her assisted living with hospice  Active Problems Hypertension -Atenolol and Aricept are being on hold due to borderline bradycardia with these agents, she is on hydralazine 10 3 times daily but blood pressure this morning was on the lower side of normal.  Change hydralazine to lower dose  Sinus bradycardia -Atenolol, Aricept on hold, now stable.  Hypothyroidism -continue Synthroid  Asymptomatic bacteriuria -No fever, no symptoms, hold off of antibiotics  Alzheimer's dementia with behavioral disturbances -Appears stable, mostly bedbound since fracture last year  Left wrist pain following fall -Imaging negative for fractures in the left wrist, extensive degenerative changes seen -Symptomatic management  Circumferential wall thickening of the upper esophagus likely due to reflux esophagitis -No further evaluation suggested  PMH colon cancer  Scheduled Meds: . busPIRone  15 mg Oral BID  . feeding supplement (ENSURE ENLIVE)  237 mL Oral Q24H  . fentaNYL  1 patch Transdermal Q72H  . hydrALAZINE  10 mg Oral Q8H  . levothyroxine  50 mcg Oral QAC breakfast  . mirtazapine  30 mg Oral QHS  . sertraline  25 mg Oral Daily  . sodium  chloride flush  3 mL Intravenous Q12H   Continuous Infusions: . sodium chloride     PRN Meds:.sodium chloride, acetaminophen **OR** acetaminophen, alum & mag hydroxide-simeth, antiseptic oral  rinse, glycopyrrolate **OR** glycopyrrolate **OR** glycopyrrolate, haloperidol **OR** haloperidol **OR** haloperidol lactate, loperamide, LORazepam **OR** LORazepam **OR** LORazepam, magnesium hydroxide, morphine CONCENTRATE **OR** morphine CONCENTRATE, ondansetron **OR** ondansetron (ZOFRAN) IV, polyvinyl alcohol, sodium chloride flush  DVT prophylaxis: SCDs Code Status: DNR Family Communication: Discussed with patient's son who was at bedside conference call with patient's daughter Disposition Plan: TBD  Consultants:  Neurology Palliative/hospice  Procedures:  2D echo:  IMPRESSIONS  1. Left ventricular ejection fraction, by visual estimation, is 60 to 65%. The left ventricle has normal function. Normal left ventricular size. There is mildly increased left ventricular hypertrophy.  2. Left ventricular diastolic Doppler parameters are consistent with impaired relaxation pattern of LV diastolic filling.  3. Global right ventricle has normal systolic function.The right ventricular size is not well visualized. No increase in right ventricular wall thickness.  4. Left atrial size was not well visualized.  5. Right atrial size was not well visualized.  6. The pericardium was not well visualized.  7. Moderate calcification of the mitral valve leaflet(s).  8. The mitral valve is grossly normal. Mild mitral valve regurgitation.  9. The tricuspid valve is not well visualized. Tricuspid valve regurgitation was not visualized by color flow Doppler. 10. The aortic valve is tricuspid Aortic valve regurgitation is trivial by color flow Doppler. Mild to moderate aortic valve sclerosis/calcification without any evidence of aortic stenosis. 11. The pulmonic valve was grossly normal. Pulmonic valve regurgitation is trivial by color flow Doppler. 12. TR signal is inadequate for assessing pulmonary artery systolic pressure. 13. The interatrial septum was not well visualized.  Microbiology  Urine cultures  06/23/2019 E. coli, Aerococcus.  E. coli sensitive to ceftriaxone, nitrofurantoin but otherwise resistant  Antimicrobials: None   Objective: Vitals:   07/07/19 2150 07/08/19 0600 07/08/19 0741 07/08/19 0743  BP: 138/60 (!) 111/52 (!) 127/59   Pulse: 86 63 62 (!) 59  Resp:   (!) 22   Temp:   98.4 F (36.9 C)   TempSrc:   Oral   SpO2:   98% 94%  Weight:      Height:        Intake/Output Summary (Last 24 hours) at 07/08/2019 1033 Last data filed at 07/08/2019 0436 Gross per 24 hour  Intake -  Output 500 ml  Net -500 ml   Filed Weights   06/22/19 1310  Weight: 68 kg    Examination:  Constitutional: NAD Eyes: lids and conjunctivae normal, no scleral icterus ENMT: Mucous membranes are moist.  Neck: normal, supple Respiratory: clear to auscultation bilaterally, no wheezing, no crackles. Normal respiratory effort.  Cardiovascular: Regular rate and rhythm, 3/6 SEM, no edema Abdomen: no tenderness. Bowel sounds positive.  Musculoskeletal: no clubbing / cyanosis.  Skin: no rashes Neurologic: Moves all 4 extremities independently, does not follow commands Psychiatric: Normal judgment and insight. Alert and oriented x 3. Normal mood.    Data Reviewed: I have independently reviewed following labs and imaging studies   CBC: No results for input(s): WBC, NEUTROABS, HGB, HCT, MCV, PLT in the last 168 hours. Basic Metabolic Panel: No results for input(s): NA, K, CL, CO2, GLUCOSE, BUN, CREATININE, CALCIUM, MG, PHOS in the last 168 hours. GFR: Estimated Creatinine Clearance: 39 mL/min (by C-G formula based on SCr of 0.81 mg/dL). Liver Function Tests: No results for input(s): AST, ALT, ALKPHOS,  BILITOT, PROT, ALBUMIN in the last 168 hours. Coagulation Profile: No results for input(s): INR, PROTIME in the last 168 hours. HbA1C: No results for input(s): HGBA1C in the last 72 hours. CBG: No results for input(s): GLUCAP in the last 168 hours.  No results found for this or any  previous visit (from the past 240 hour(s)).   Radiology Studies: No results found.  Marzetta Board, MD, PhD [facility]  Contact via  [email or web address]

## 2019-07-09 NOTE — TOC Progression Note (Signed)
Transition of Care [facility]) - Progression Note    Patient Details  Name: Kerri Rangel MRN: [id number] Date of Birth: 09/03/1923  Transition of Care [facility]) CM/SW Contact  Hend Mccarrell, Lenice Llamas Phone Number: [phone]  07/09/2019, 3:13 PM  Clinical Narrative: Per [facility] staff member patient can return to [facility]F tomorrow pending negative covid test. CSW asked MD to order covd test today. [facility] will follow patient at [place]. [place] [facility] liaison is aware of above. Per Santiago Glad she will get patient a hospital bed at [place]. Clinical Social Worker (CSW) contacted patient's daughter [name] and made her aware of above. RN aware of above. CSW will continue to follow and assist as needed.     Expected Discharge Plan: Assisted Living Barriers to Discharge: Continued Medical Work up  Expected Discharge Plan and Services Expected Discharge Plan: Assisted Living   Discharge Planning Services: CM Consult   Living arrangements for the past 2 months: Assisted Living Facility(Memory Care)                                       Social Determinants of Health (SDOH) Interventions    Readmission Risk Interventions No flowsheet data found.

## 2019-07-09 NOTE — TOC Progression Note (Signed)
Transition of Care [facility]) - Progression Note    Patient Details  Name: Kerri Rangel MRN: [id number] Date of Birth: 06-26-1924  Transition of Care [facility]) CM/SW Contact  Nickholas Goldston, Lenice Llamas Phone Number: [phone]  07/09/2019, 10:47 AM  Clinical Narrative: Theadora Rama from [facility] came to assess patient today. Clinical Education officer, museum (CSW) met Brandy at bedside. Per Theadora Rama since patient needs a lot of assistance she will have to talk to her director before making a decision. CSW made Brandy aware that patient would came back to [facility] with hospice and can have a hospital bed. Per Theadora Rama she will call CSW in a few hours with their decision. CSW will continue to follow and assist as needed.     Expected Discharge Plan: Assisted Living Barriers to Discharge: Continued Medical Work up  Expected Discharge Plan and Services Expected Discharge Plan: Assisted Living   Discharge Planning Services: CM Consult   Living arrangements for the past 2 months: Assisted Living Facility(Memory Care)                                       Social Determinants of Health (SDOH) Interventions    Readmission Risk Interventions No flowsheet data found.

## 2019-07-09 NOTE — Progress Notes (Signed)
PROGRESS NOTE  Kerri Rangel [id number] DOB: 05-Oct-1923 DOA: 06/22/2019 PCP: [facility]   LOS: 16 days   Brief Narrative / Interim history: 85 year old woman with history of colon cancer, dementia, depression, hypothyroidism, was admitted to the hospital on 06/22/2019 with left upper extremity weakness.  The CT of the head in the ED suggested possible right parietal infarct, and this was followed by an MRI which proved a large acute posterior right MCA territory infarct without hemorrhage.  Neurology consulted patient followed while hospitalized, recommending eventually aspirin and Plavix for 3 weeks alone then followed by Plavix alone.  Physical therapy recommended SNF however insurance company denied rehab, even after peer to peer was held by the attending physician on 10/31.  Palliative care medicine involved in her care.  Initial plan was to start comfort care here and then transferred to hospice facility, however she started to improve clinically and felt not to be a candidate for hospice given life expectancy expected to exceed past 2 weeks  Subjective / 24h Interval events: Confused, alert, no complaints  Assessment & Plan: Principal Problem:   CVA (cerebral vascular accident) [facility]) Active Problems:   Hypothyroidism   Late onset Alzheimer's disease with behavioral disturbance ([place])   Dementia ([place])   Stroke [facility])   Essential hypertension   Principal Problem Large acute posterior right MCA infarct without hemorrhage -Likely embolic, neurology consulted and followed patient while hospitalized.  She underwent a carotid artery ultrasound without significant ICA stenosis but did reveal an incomplete still in the right vertebral artery which suggest stenosis proximal subclavian or brachiocephalic arteries.  She underwent a 2D echo which showed no cardiac source of emboli, full report below.  LDL was 111, A1c 4.9.  Recommendations were for aspirin and Plavix for 3  weeks then continue Plavix alone.  She is already on statin -When she deteriorated her oral agents were discontinued, now that she is improving we will readd her Plavix and aspirin -She has poor p.o. intake, continue dysphagia 1 diet -Insurance denied SNF, not a candidate for hospice facility.   -she will return to her ALF pending repeat Covid  Active Problems Hypertension -Atenolol and Aricept are being on hold due to borderline bradycardia with these agents, she is on hydralazine 10 3 times daily but blood pressure this morning was on the lower side of normal.  Change hydralazine to lower dose  Sinus bradycardia -Atenolol, Aricept on hold, now stable.  Hypothyroidism -continue Synthroid  Asymptomatic bacteriuria -No fever, no symptoms, hold off of antibiotics  Alzheimer's dementia with behavioral disturbances -Appears stable, mostly bedbound since fracture last year  Left wrist pain following fall -Imaging negative for fractures in the left wrist, extensive degenerative changes seen -Symptomatic management  Circumferential wall thickening of the upper esophagus likely due to reflux esophagitis -No further evaluation suggested  PMH colon cancer  Scheduled Meds:  aspirin  81 mg Oral Daily   atorvastatin  40 mg Oral q1800   busPIRone  15 mg Oral BID   clopidogrel  75 mg Oral Daily   feeding supplement (ENSURE ENLIVE)  237 mL Oral Q24H   fentaNYL  1 patch Transdermal Q72H   hydrALAZINE  5 mg Oral Q8H   levothyroxine  50 mcg Oral QAC breakfast   mirtazapine  30 mg Oral QHS   sertraline  25 mg Oral Daily   sodium chloride flush  3 mL Intravenous Q12H   Continuous Infusions:  sodium chloride     PRN Meds:.sodium chloride, acetaminophen **  OR** acetaminophen, alum & mag hydroxide-simeth, antiseptic oral rinse, glycopyrrolate **OR** glycopyrrolate **OR** glycopyrrolate, haloperidol **OR** haloperidol **OR** haloperidol lactate, loperamide, LORazepam **OR**  LORazepam **OR** LORazepam, magnesium hydroxide, morphine CONCENTRATE **OR** morphine CONCENTRATE, ondansetron **OR** ondansetron (ZOFRAN) IV, polyvinyl alcohol, sodium chloride flush  DVT prophylaxis: SCDs Code Status: DNR Family Communication: Discussed with patient's son who was at bedside conference call with patient's daughter Disposition Plan: TBD  Consultants:  Neurology Palliative/hospice  Procedures:  2D echo:  IMPRESSIONS  1. Left ventricular ejection fraction, by visual estimation, is 60 to 65%. The left ventricle has normal function. Normal left ventricular size. There is mildly increased left ventricular hypertrophy.  2. Left ventricular diastolic Doppler parameters are consistent with impaired relaxation pattern of LV diastolic filling.  3. Global right ventricle has normal systolic function.The right ventricular size is not well visualized. No increase in right ventricular wall thickness.  4. Left atrial size was not well visualized.  5. Right atrial size was not well visualized.  6. The pericardium was not well visualized.  7. Moderate calcification of the mitral valve leaflet(s).  8. The mitral valve is grossly normal. Mild mitral valve regurgitation.  9. The tricuspid valve is not well visualized. Tricuspid valve regurgitation was not visualized by color flow Doppler. 10. The aortic valve is tricuspid Aortic valve regurgitation is trivial by color flow Doppler. Mild to moderate aortic valve sclerosis/calcification without any evidence of aortic stenosis. 11. The pulmonic valve was grossly normal. Pulmonic valve regurgitation is trivial by color flow Doppler. 12. TR signal is inadequate for assessing pulmonary artery systolic pressure. 13. The interatrial septum was not well visualized.  Microbiology  Urine cultures 06/23/2019 E. coli, Aerococcus.  E. coli sensitive to ceftriaxone, nitrofurantoin but otherwise resistant  Antimicrobials: None   Objective: Vitals:     07/08/19 2048 07/09/19 0458 07/09/19 0851 07/09/19 0931  BP: (!) 155/58 (!) 122/55 (!) 113/56 (!) 160/70  Pulse: 61 67 67 64  Resp: 16 18 14 20   Temp: 98 F (36.7 C) 98.6 F (37 C) 98 F (36.7 C) 97.9 F (36.6 C)  TempSrc: Oral Oral Oral Oral  SpO2: 100% 99% 100% 93%  Weight:      Height:        Intake/Output Summary (Last 24 hours) at 07/09/2019 1402 Last data filed at 07/09/2019 0940 Gross per 24 hour  Intake 240 ml  Output 1101 ml  Net -861 ml   Filed Weights   06/22/19 1310  Weight: 68 kg    Examination:  Constitutional: NAD Respiratory: CTA biL Cardiovascular: RRR  Data Reviewed: I have independently reviewed following labs and imaging studies   CBC: No results for input(s): WBC, NEUTROABS, HGB, HCT, MCV, PLT in the last 168 hours. Basic Metabolic Panel: No results for input(s): NA, K, CL, CO2, GLUCOSE, BUN, CREATININE, CALCIUM, MG, PHOS in the last 168 hours. GFR: Estimated Creatinine Clearance: 39 mL/min (by C-G formula based on SCr of 0.81 mg/dL). Liver Function Tests: No results for input(s): AST, ALT, ALKPHOS, BILITOT, PROT, ALBUMIN in the last 168 hours. Coagulation Profile: No results for input(s): INR, PROTIME in the last 168 hours. HbA1C: No results for input(s): HGBA1C in the last 72 hours. CBG: No results for input(s): GLUCAP in the last 168 hours.  No results found for this or any previous visit (from the past 240 hour(s)).   Radiology Studies: No results found.  Marzetta Board, MD, PhD [facility]  Contact via  [email or web address]

## 2019-07-09 NOTE — Progress Notes (Signed)
Manufacturing engineer [facility]) Hospital Liaison note  Notified by CSW [facility] that [facility] of hosp[facility] services. Patient's daughter [name] was supportive of this plan. Hospital bed has been ordered for delivery tomorrow. Updated notes sent to referral. Will continue to follow through discharge. Flo Shanks BSN, RN, [place] [phone]

## 2019-07-10 LAB — SARS CORONAVIRUS 2 (TAT 6-24 HRS): SARS Coronavirus 2: NEGATIVE

## 2019-07-10 MED ORDER — ATORVASTATIN CALCIUM 40 MG PO TABS: 40.0000 mg | ORAL_TABLET | Freq: Every day | ORAL | 0 refills | Status: AC

## 2019-07-10 MED ORDER — HYDRALAZINE HCL 10 MG PO TABS: 5.0000 mg | ORAL_TABLET | Freq: Three times a day (TID) | ORAL | 0 refills | Status: AC

## 2019-07-10 MED ORDER — CLOPIDOGREL BISULFATE 75 MG PO TABS: 75.0000 mg | ORAL_TABLET | Freq: Every day | ORAL | 0 refills | Status: DC

## 2019-07-10 MED ORDER — LORAZEPAM 0.5 MG PO TABS: 0.5000 mg | ORAL_TABLET | Freq: Every day | ORAL | 0 refills | Status: AC | PRN

## 2019-07-10 MED ORDER — CLOPIDOGREL BISULFATE 75 MG PO TABS: 75.0000 mg | ORAL_TABLET | Freq: Every day | ORAL | 0 refills | Status: AC

## 2019-07-10 NOTE — Progress Notes (Signed)
Patient discharged via EMS. Audreyana Huntsberry S, RN  

## 2019-07-10 NOTE — Care Management Important Message (Signed)
Important Message  Patient Details  Name: Kerri Rangel MRN: [id number] Date of Birth: May 23, 1924   Medicare Important Message Given:  Yes     Juliann Pulse A Dennis [place] 07/10/2019, 11:06 AM

## 2019-07-10 NOTE — Discharge Summary (Signed)
Physician Discharge Summary  Kerri Rangel [id number] DOB: 1924/03/30 DOA: 06/22/2019  PCP: Orvis Brill, [facility]  Admit date: 06/22/2019 Discharge date: 07/10/2019  Admitted From: ALF Disposition:  ALF with hospice  Recommendations for Outpatient Follow-up:  1. Follow up with hospice services 2. Continue Plavix and Aspirin for 5 more days then stop aspirin and continue Plavix alone  Home Health: None Equipment/Devices: None  Discharge Condition: Stable CODE STATUS: DNR Diet recommendation: Regular, comfort feeding  HPI: Per admitting MD, Kerri Rangel  is a 85 y.o. female with a known history of colon cancer, anxiety, depression, history of dementia, hypertension hyperlipidemia, hypothyroidism who presents to the hospital due to left upper extremity weakness.  Patient had a fall a few days back and had some injury to her left arm.  Her left wrist was somewhat swollen but today the staff at the assisted living noticed that she was not moving her left arm very well.  She was therefore sent to the ER for further evaluation.  Patient underwent a CT of the head here in the ER which showed a possible sided parietal infarct.  ER physician spoke to patient's family/daughter who recommended further work-up for the stroke.  Patient herself is a very poor historian therefore most of the history obtained from the ER physician and from the family.  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  She underwent a carotid artery ultrasound without significant ICA stenosis but did reveal an incomplete still in the right vertebral artery which suggest stenosis proximal subclavian or brachiocephalic arteries.  She underwent a 2D echo which showed no cardiac source of emboli, full report below.  LDL was 111, A1c 4.9.  Recommendations were for  aspirin and Plavix for 3 weeks then continue Plavix alone.  She is already on statin.  After discharge, continue dual antiplatelet therapy for 5 more days and then discontinue aspirin and keep on Plavix.  Dysphagia 1 diet Active Problems Hypertension -Atenolol and Aricept are being on hold due to borderline bradycardia with these agents, she is on hydralazine. Stable. Sinus bradycardia -Atenolol, Aricept on hold, now stable. Hypothyroidism -continue Synthroid Asymptomatic bacteriuria -No fever, no symptoms, stable without antibiotics Alzheimer's dementia with behavioral disturbances -Appears stable Left wrist pain following fall -Imaging negative for fractures in the left wrist, extensive degenerative changes seen Circumferential wall thickening of the upper esophagus likely due to reflux esophagitis -No further evaluation suggested PMH colon cancer  COVID-19  Lab Results  Component Value Date   [place] NEGATIVE 07/09/2019   [place] NEGATIVE 06/26/2019   [place] NEGATIVE 06/22/2019     Discharge Instructions   Allergies as of 07/10/2019   No Known Allergies     Medication List    STOP taking these medications   atenolol 50 MG tablet Commonly known as: TENORMIN   donepezil 5 MG tablet Commonly known as: ARICEPT     TAKE these medications   acetaminophen 500 MG tablet Commonly known as: TYLENOL Take 500 mg by mouth every 6 (six) hours as needed for mild pain, fever or headache. What changed: Another medication with the same name was removed. Continue taking this medication, and follow the directions you see here.   aluminum-magnesium hydroxide-simethicone [id number] MG/5ML Susp Commonly known as: MAALOX Take 30 mLs by mouth every 6 (six) hours as needed (for heartburn and indigestion).   aspirin 81 MG EC tablet Take 1 tablet (  81 mg total) by mouth daily.   atorvastatin 40 MG tablet Commonly known as: LIPITOR Take 1 tablet (40 mg total) by mouth daily at  6 PM.   busPIRone 15 MG tablet Commonly known as: BUSPAR Take 15 mg by mouth 2 (two) times daily.   clopidogrel 75 MG tablet Commonly known as: PLAVIX Take 1 tablet (75 mg total) by mouth daily.   docusate sodium 100 MG capsule Commonly known as: COLACE Take 1 capsule (100 mg total) by mouth 2 (two) times daily.   fentaNYL 12 MCG/HR Commonly known as: DURAGESIC Place 1 patch (12.5 mcg total) onto the skin every 3 (three) days.   ferrous sulfate 325 (65 FE) MG tablet Take 325 mg by mouth daily with breakfast.   hydrALAZINE 10 MG tablet Commonly known as: APRESOLINE Take 0.5 tablets (5 mg total) by mouth every 8 (eight) hours.   levothyroxine 50 MCG tablet Commonly known as: SYNTHROID Take 50 mcg by mouth daily before breakfast.   loperamide 2 MG capsule Commonly known as: IMODIUM Take 2 mg by mouth as needed for diarrhea or loose stools.   LORazepam 0.5 MG tablet Commonly known as: ATIVAN Take 1 tablet (0.5 mg total) by mouth daily as needed for anxiety.   magnesium hydroxide 400 MG/5ML suspension Commonly known as: MILK OF MAGNESIA Take 30 mLs by mouth at bedtime as needed for mild constipation or moderate constipation.   mirtazapine 30 MG tablet Commonly known as: REMERON Take 30 mg by mouth at bedtime.   neomycin-bacitracin-polymyxin [phone] ointment Apply 1 application topically as needed (for wound care).   pantoprazole 40 MG tablet Commonly known as: PROTONIX Take 40 mg by mouth daily.   saccharomyces boulardii 250 MG capsule Commonly known as: FLORASTOR Take 250 mg by mouth 2 (two) times daily.   sertraline 25 MG tablet Commonly known as: ZOLOFT Take 25 mg by mouth daily.   vitamin B-12 1000 MCG tablet Commonly known as: CYANOCOBALAMIN Take 1,000 mcg by mouth daily.      Consultations:  Neurology   Palliative care  Procedures/Studies:  2D echo  IMPRESSIONS    1. Left ventricular ejection fraction, by visual estimation, is 60 to  65%. The left ventricle has normal function. Normal left ventricular size. There is mildly increased left ventricular hypertrophy.  2. Left ventricular diastolic Doppler parameters are consistent with impaired relaxation pattern of LV diastolic filling.  3. Global right ventricle has normal systolic function.The right ventricular size is not well visualized. No increase in right ventricular wall thickness.  4. Left atrial size was not well visualized.  5. Right atrial size was not well visualized.  6. The pericardium was not well visualized.  7. Moderate calcification of the mitral valve leaflet(s).  8. The mitral valve is grossly normal. Mild mitral valve regurgitation.  9. The tricuspid valve is not well visualized. Tricuspid valve regurgitation was not visualized by color flow Doppler. 10. The aortic valve is tricuspid Aortic valve regurgitation is trivial by color flow Doppler. Mild to moderate aortic valve sclerosis/calcification without any evidence of aortic stenosis. 11. The pulmonic valve was grossly normal. Pulmonic valve regurgitation is trivial by color flow Doppler. 12. TR signal is inadequate for assessing pulmonary artery systolic pressure. 13. The interatrial septum was not well visualized.   Dg Chest 1 View  Result Date: 06/22/2019 CLINICAL DATA:  Fall. EXAM: CHEST  1 VIEW COMPARISON:  September 29, 2017. FINDINGS: Stable cardiomediastinal silhouette. Atherosclerosis of thoracic aorta is noted. No pneumothorax or pleural  effusion is noted. Bony thorax is unremarkable. No acute pulmonary disease is noted. IMPRESSION: No active disease. Aortic Atherosclerosis (ICD10-I70.0). Electronically Signed   By: Marijo Conception M.D.   On: 06/22/2019 15:04   Dg Forearm Left  Result Date: 06/22/2019 CLINICAL DATA:  Fall. EXAM: LEFT FOREARM - 2 VIEW COMPARISON:  None. FINDINGS: There is no evidence of fracture or other focal bone lesions. Soft tissues are unremarkable. IMPRESSION: Negative.  Electronically Signed   By: Marijo Conception M.D.   On: 06/22/2019 15:08   Dg Wrist Complete Left  Result Date: 06/22/2019 CLINICAL DATA:  Fall. EXAM: LEFT WRIST - COMPLETE 3+ VIEW COMPARISON:  None. FINDINGS: No definite fracture or dislocation is noted. There appears to be extensive degenerative change seen involving the radiocarpal and ulnar carpal joints, with subchondral cyst formation. Degenerative changes are seen involving several intercarpal joints as well as the first carpometacarpal joint. Soft tissues are unremarkable. IMPRESSION: Extensive degenerative changes as described above. No definite acute abnormality seen in the left wrist. Electronically Signed   By: Marijo Conception M.D.   On: 06/22/2019 15:10   Ct Head Wo Contrast  Result Date: 06/22/2019 CLINICAL DATA:  Minor head trauma. EXAM: CT HEAD WITHOUT CONTRAST CT CERVICAL SPINE WITHOUT CONTRAST TECHNIQUE: Multidetector CT imaging of the head and cervical spine was performed following the standard protocol without intravenous contrast. Multiplanar CT image reconstructions of the cervical spine were also generated. COMPARISON:  Head CT 06/09/2018 and cervical spine CT 05/26/2008 FINDINGS: CT HEAD FINDINGS Brain: Mild prominence of the ventricles, cisterns and other CSF spaces compatible with atrophy. Moderate chronic ischemic microvascular disease is present. Small old lacunar infarct over the left basal ganglia. Small old infarct over the right occipital region. Hazy region of low-attenuation over the gray-white matter of the posterior right parietal region measuring approximately 4.4 x 5.2 cm likely acute to subacute infarct. There is minimal local mass effect. No evidence of midline shift. No evidence of focal mass or acute hemorrhage. Bilateral basal ganglia calcifications are present. Vascular: No hyperdense vessel or unexpected calcification. Skull: Normal. Negative for fracture or focal lesion. Sinuses/Orbits: No acute finding. Other:  None. CT CERVICAL SPINE FINDINGS Alignment: Normal. Skull base and vertebrae: Vertebral body heights are normal. There is mild spondylosis throughout the cervical spine. Atlantoaxial articulation is notable for degenerative changes. There is uncovertebral joint spurring and facet arthropathy. No acute fracture or subluxation. Right-sided neural foraminal narrowing at the C4-5 and C6-7 levels with moderate right-sided neural foraminal narrowing at the C5-6 level. Soft tissues and spinal canal: No prevertebral fluid or swelling. No visible canal hematoma. Disc levels:  Minimal disc space narrowing at the C5-6 level. Upper chest: Negative. Other: Mild esophageal wall thickening over the upper esophagus which may be due to reflux esophagitis. IMPRESSION: 1. Hazy attenuation over the gray-white matter of the right posterior parietal region likely acute to subacute watershed infarction. Minimal associated local mass effect. No midline shift or acute hemorrhage. 2. Age related atrophic change with small vessel chronic ischemic disease and small old infarcts as described. 3.  No acute cervical spine injury. 4. Mild spondylosis throughout the cervical spine with disc disease at the C5-6 level. Multilevel bilateral neural foraminal narrowing worse at the C5-6 level. 5. Circumferential wall thickening of the upper esophagus likely due to reflux esophagitis. Recommend clinical correlation. Electronically Signed   By: Marin Olp M.D.   On: 06/22/2019 14:38   Ct Cervical Spine Wo Contrast  Result Date: 06/22/2019 CLINICAL  DATA:  Minor head trauma. EXAM: CT HEAD WITHOUT CONTRAST CT CERVICAL SPINE WITHOUT CONTRAST TECHNIQUE: Multidetector CT imaging of the head and cervical spine was performed following the standard protocol without intravenous contrast. Multiplanar CT image reconstructions of the cervical spine were also generated. COMPARISON:  Head CT 06/09/2018 and cervical spine CT 05/26/2008 FINDINGS: CT HEAD FINDINGS  Brain: Mild prominence of the ventricles, cisterns and other CSF spaces compatible with atrophy. Moderate chronic ischemic microvascular disease is present. Small old lacunar infarct over the left basal ganglia. Small old infarct over the right occipital region. Hazy region of low-attenuation over the gray-white matter of the posterior right parietal region measuring approximately 4.4 x 5.2 cm likely acute to subacute infarct. There is minimal local mass effect. No evidence of midline shift. No evidence of focal mass or acute hemorrhage. Bilateral basal ganglia calcifications are present. Vascular: No hyperdense vessel or unexpected calcification. Skull: Normal. Negative for fracture or focal lesion. Sinuses/Orbits: No acute finding. Other: None. CT CERVICAL SPINE FINDINGS Alignment: Normal. Skull base and vertebrae: Vertebral body heights are normal. There is mild spondylosis throughout the cervical spine. Atlantoaxial articulation is notable for degenerative changes. There is uncovertebral joint spurring and facet arthropathy. No acute fracture or subluxation. Right-sided neural foraminal narrowing at the C4-5 and C6-7 levels with moderate right-sided neural foraminal narrowing at the C5-6 level. Soft tissues and spinal canal: No prevertebral fluid or swelling. No visible canal hematoma. Disc levels:  Minimal disc space narrowing at the C5-6 level. Upper chest: Negative. Other: Mild esophageal wall thickening over the upper esophagus which may be due to reflux esophagitis. IMPRESSION: 1. Hazy attenuation over the gray-white matter of the right posterior parietal region likely acute to subacute watershed infarction. Minimal associated local mass effect. No midline shift or acute hemorrhage. 2. Age related atrophic change with small vessel chronic ischemic disease and small old infarcts as described. 3.  No acute cervical spine injury. 4. Mild spondylosis throughout the cervical spine with disc disease at the C5-6  level. Multilevel bilateral neural foraminal narrowing worse at the C5-6 level. 5. Circumferential wall thickening of the upper esophagus likely due to reflux esophagitis. Recommend clinical correlation. Electronically Signed   By: Marin Olp M.D.   On: 06/22/2019 14:38   Mr Brain Wo Contrast  Result Date: 06/22/2019 CLINICAL DATA:  Left upper extremity weakness EXAM: MRI HEAD WITHOUT CONTRAST TECHNIQUE: Multiplanar, multiecho pulse sequences of the brain and surrounding structures were obtained without intravenous contrast. COMPARISON:  Head CT 06/22/2019 and brain MRI 05/27/2008 FINDINGS: BRAIN: There is a large acute infarct of the posterior right MCA territory. No hemorrhage or mass effect. No other area of ischemia. There is an old right occipital infarct. Diffuse confluent white matter hyperintensity, most often a result of chronic microvascular ischemia. There is generalized atrophy without lobar predilection. There is an old small vessel infarct of the left deep gray nuclei. VASCULAR: The major intracranial arterial and venous sinus flow voids are normal. Susceptibility-sensitive sequences show no chronic microhemorrhage or superficial siderosis. SKULL AND UPPER CERVICAL SPINE: Calvarial bone marrow signal is normal. There is no skull base mass. The visualized upper cervical spine and soft tissues are normal. SINUSES/ORBITS: There are no fluid levels or advanced mucosal thickening. The mastoid air cells and middle ear cavities are free of fluid. The orbits are normal. IMPRESSION: 1. Large acute posterior right MCA territory infarct without hemorrhage or mass effect. 2. Old right occipital infarct and severe chronic small vessel ischemia. Electronically Signed  By: Ulyses Jarred M.D.   On: 06/22/2019 23:21   Dg Pelvis Portable  Result Date: 06/22/2019 CLINICAL DATA:  Fall. EXAM: PORTABLE PELVIS 1-2 VIEWS COMPARISON:  September 26, 2017. FINDINGS: Status post surgical internal fixation of proximal  left femoral intertrochanteric fracture. Right hip is unremarkable. No acute fracture or dislocation is noted. IMPRESSION: Postsurgical changes as described above.  No acute abnormality seen. Electronically Signed   By: Marijo Conception M.D.   On: 06/22/2019 15:06   Dg Hand 2 View Left  Result Date: 06/22/2019 CLINICAL DATA:  Fall. EXAM: LEFT HAND - 2 VIEW COMPARISON:  None. FINDINGS: There is no evidence of fracture or dislocation. Extensive degenerative changes are seen involving the radiocarpal, intercarpal and first carpometacarpal joints. Soft tissues are unremarkable. IMPRESSION: Extensive degenerative changes as described above. No acute abnormality seen in the left hand. Electronically Signed   By: Marijo Conception M.D.   On: 06/22/2019 15:12   US Carotid Bilateral  Result Date: 06/23/2019 CLINICAL DATA:  85 year old female with new large right posterior MCA territory infarct EXAM: BILATERAL CAROTID DUPLEX ULTRASOUND TECHNIQUE: Pearline Cables scale imaging, color Doppler and duplex ultrasound were performed of bilateral carotid and vertebral arteries in the neck. COMPARISON:  Prior carotid duplex ultrasound 05/27/2008 FINDINGS: Criteria: Quantification of carotid stenosis is based on velocity parameters that correlate the residual internal carotid diameter with NASCET-based stenosis levels, using the diameter of the distal internal carotid lumen as the denominator for stenosis measurement. The following velocity measurements were obtained: RIGHT ICA: 160/23 cm/sec CCA: [id number] cm/sec SYSTOLIC ICA/CCA RATIO:  2.7 ECA:  211 cm/sec LEFT ICA: 149/24 cm/sec CCA: [id number] cm/sec SYSTOLIC ICA/CCA RATIO:  1.9 ECA:  158 cm/sec RIGHT CAROTID ARTERY: Mild heterogeneous atherosclerotic plaque in the proximal internal carotid artery. By peak systolic velocity criteria, there is less than 50% stenosis in the region of atherosclerotic plaque. The remainder of the internal carotid artery is tortuous resulting in spuriously elevated  velocities. RIGHT VERTEBRAL ARTERY: Early systolic deceleration consistent with incomplete steal. LEFT CAROTID ARTERY: Trace heterogeneous atherosclerotic plaque in the proximal internal carotid artery. By peak systolic velocity criteria, the estimated stenosis remains less than 50%. LEFT VERTEBRAL ARTERY:  Patent with normal antegrade flow. IMPRESSION: 1. Mild (1-49%) stenosis proximal right internal carotid artery secondary to heterogenous atherosclerotic plaque. 2. Mild (1-49%) stenosis proximal left internal carotid artery secondary to heterogenous atherosclerotic plaque. 3. 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 4. Normal antegrade flow in the left vertebral artery. Signed, Criselda Peaches, MD, [facility] Electronically Signed   By: Jacqulynn Cadet M.D.   On: 06/23/2019 09:06   US Venous Img Upper Uni Left  Result Date: 06/24/2019 CLINICAL DATA:  Left arm edema. EXAM: LEFT UPPER EXTREMITY VENOUS DOPPLER ULTRASOUND TECHNIQUE: Gray-scale sonography with graded compression, as well as color Doppler and duplex ultrasound were performed to evaluate the upper extremity deep venous system from the level of the subclavian vein and including the jugular, axillary, basilic, radial, ulnar and upper cephalic vein. Spectral Doppler was utilized to evaluate flow at rest and with distal augmentation maneuvers. COMPARISON:  None. FINDINGS: Contralateral Subclavian Vein: Respiratory phasicity is normal and symmetric with the symptomatic side. No evidence of thrombus. Normal compressibility. Internal Jugular Vein: No evidence of thrombus. Normal compressibility, respiratory phasicity and response to augmentation. Subclavian Vein: No evidence of thrombus. Normal compressibility, respiratory phasicity and response to augmentation. Axillary  Vein: No evidence of  thrombus. Normal compressibility, respiratory phasicity and response to augmentation. Cephalic Vein: No evidence of thrombus. Normal compressibility, respiratory phasicity and response to augmentation. Basilic Vein: No evidence of thrombus. Normal compressibility, respiratory phasicity and response to augmentation. Brachial Veins: No evidence of thrombus. Normal compressibility, respiratory phasicity and response to augmentation. Radial Veins: No evidence of thrombus. Normal compressibility, respiratory phasicity and response to augmentation. Ulnar Veins: No evidence of thrombus. Normal compressibility, respiratory phasicity and response to augmentation. Venous Reflux:  None visualized. Other Findings: No evidence of superficial thrombophlebitis or abnormal fluid collection. IMPRESSION: No evidence of DVT within the left upper extremity. Electronically Signed   By: Aletta Edouard M.D.   On: 06/24/2019 16:15   Dg Humerus Left  Result Date: 06/22/2019 CLINICAL DATA:  Fall. EXAM: LEFT HUMERUS - 2+ VIEW COMPARISON:  None. FINDINGS: There is no evidence of fracture or other focal bone lesions. Soft tissues are unremarkable. IMPRESSION: Negative. Electronically Signed   By: Marijo Conception M.D.   On: 06/22/2019 15:11   Dg Hand Complete Left  Result Date: 06/20/2019 CLINICAL DATA:  Left hand injury EXAM: LEFT HAND - COMPLETE 3+ VIEW COMPARISON:  None. FINDINGS: Suboptimal radiographs due to flexed positioning of the fingers, best obtainable due to patient related factors (patient with dementia). No evidence of fracture or dislocation. Chondrocalcinosis in the TFCC. Moderate first carpometacarpal joint osteoarthritis. Degenerative changes in the distal radioulnar joint and ulnocarpal joint. No radiopaque foreign body. No suspicious focal osseous lesions. IMPRESSION: 1. No evidence of fracture or dislocation in the left hand on these limited views, see comments. 2. Chondrocalcinosis in the TFCC, suggesting CPPD  arthropathy. 3. 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 Electronically Signed   By: Ilona Sorrel M.D.   On: 06/20/2019 15:57    Subjective: No complaints. Confused  Discharge Exam: BP (!) 144/62 (BP Location: Left Arm)    Pulse 82    Temp 97.7 F (36.5 C) (Axillary)    Resp 17    Ht 5\' 5"  (1.651 m)    Wt 68 kg    SpO2 98%    BMI 24.96 kg/m   General: Pt is alert, awake, not in acute distress Cardiovascular: RRR, S1/S2 +, no rubs, no gallops Respiratory: CTA bilaterally, no wheezing, no rhonchi    The results of significant diagnostics from this hospitalization (including imaging, microbiology, ancillary and laboratory) are listed below for reference.     Microbiology: Recent Results (from the past 240 hour(s))  SARS CORONAVIRUS 2 (TAT 6-24 HRS) Nasopharyngeal Nasopharyngeal Swab     Status: None   Collection Time: 07/09/19  1:49 PM   Specimen: Nasopharyngeal Swab  Result Value Ref Range Status   SARS Coronavirus 2 NEGATIVE NEGATIVE Final    Comment: (NOTE) SARS-CoV-2 target nucleic acids are NOT DETECTED. The SARS-CoV-2 RNA is generally detectable in upper and lower respiratory specimens during the acute phase of infection. Negative results do not preclude SARS-CoV-2 infection, do not rule out co-infections with other pathogens, and should not be used as the sole basis for treatment or other patient management decisions. Negative results must be combined with clinical observations, patient history, and epidemiological information. The expected result is Negative. Fact Sheet for Patients: SugarRoll.be Fact Sheet for Healthcare Providers: https://www.woods-mathews.com/ This test is not yet approved or cleared by the [place] FDA and  has been authorized for detection and/or diagnosis of SARS-CoV-2 by FDA under an Emergency Use Authorization (EUA). This EUA will remain  in effect (meaning this test can  be used) for the duration of the COVID-19 declaration under Section 56 4(b)(1) of the Act, 21 U.S.C. section 360bbb-3(b)(1), unless the authorization is terminated or revoked sooner. Performed at [facility], [place] [address] [place], [place] [address]      Labs: Basic Metabolic Panel: No results for input(s): NA, K, CL, CO2, GLUCOSE, BUN, CREATININE, CALCIUM, MG, PHOS in the last 168 hours. Liver Function Tests: No results for input(s): AST, ALT, ALKPHOS, BILITOT, PROT, ALBUMIN in the last 168 hours. CBC: No results for input(s): WBC, NEUTROABS, HGB, HCT, MCV, PLT in the last 168 hours. CBG: No results for input(s): GLUCAP in the last 168 hours. Hgb A1c No results for input(s): HGBA1C in the last 72 hours. Lipid Profile No results for input(s): CHOL, HDL, LDLCALC, TRIG, CHOLHDL, LDLDIRECT in the last 72 hours. Thyroid function studies No results for input(s): TSH, T4TOTAL, T3FREE, THYROIDAB in the last 72 hours.  Invalid input(s): FREET3 Urinalysis    Component Value Date/Time   COLORURINE AMBER (A) 06/23/2019 0539   APPEARANCEUR CLOUDY (A) 06/23/2019 0539   APPEARANCEUR Clear 12/23/2014 1349   LABSPEC 1.023 06/23/2019 0539   LABSPEC 1.009 12/23/2014 1349   PHURINE 6.0 06/23/2019 0539   GLUCOSEU NEGATIVE 06/23/2019 0539   GLUCOSEU Negative 12/23/2014 1349   HGBUR NEGATIVE 06/23/2019 0539   BILIRUBINUR NEGATIVE 06/23/2019 0539   BILIRUBINUR Negative 12/23/2014 1349   KETONESUR NEGATIVE 06/23/2019 0539   PROTEINUR 100 (A) 06/23/2019 0539   NITRITE POSITIVE (A) 06/23/2019 0539   LEUKOCYTESUR MODERATE (A) 06/23/2019 0539   LEUKOCYTESUR Trace 12/23/2014 1349    FURTHER DISCHARGE INSTRUCTIONS:   Get Medicines reviewed and adjusted: Please take all your medications with you for your next visit with your Primary MD   Laboratory/radiological data: Please request your Primary MD to go over all hospital tests and procedure/radiological results at the follow up,  please ask your Primary MD to get all Hospital records sent to his/her office.   In some cases, they will be blood work, cultures and biopsy results pending at the time of your discharge. Please request that your primary care M.D. goes through all the records of your hospital data and follows up on these results.   Also Note the following: If you experience worsening of your admission symptoms, develop shortness of breath, life threatening emergency, suicidal or homicidal thoughts you must seek medical attention immediately by calling 911 or calling your MD immediately  if symptoms less severe.   You must read complete instructions/literature along with all the possible adverse reactions/side effects for all the Medicines you take and that have been prescribed to you. Take any new Medicines after you have completely understood and accpet all the possible adverse reactions/side effects.    Do not drive when taking Pain medications or sleeping medications (Benzodaizepines)   Do not take more than prescribed Pain, Sleep and Anxiety Medications. It is not advisable to combine anxiety,sleep and pain medications without talking with your primary care practitioner   Special Instructions: If you have smoked or chewed Tobacco  in the last 2 yrs please stop smoking, stop any regular Alcohol  and or any Recreational drug use.   Wear Seat belts while driving.   Please note: You were cared for by a hospitalist during your hospital stay. Once you are discharged, your primary care physician will handle any further medical issues. Please note that NO REFILLS for any discharge medications will be authorized once you  are discharged, as it is imperative that you return to your primary care physician (or establish a relationship with a primary care physician if you do not have one) for your post hospital discharge needs so that they can reassess your need for medications and monitor your lab values.  Time  coordinating discharge: 35 minutes  SIGNED:  Marzetta Board, MD, PhD 07/10/2019, 9:57 AM

## 2019-07-10 NOTE — Progress Notes (Signed)
Attempted to call report to [facility] with no answer. Madlyn Frankel, RN

## 2019-07-10 NOTE — Progress Notes (Signed)
EMS called for transport to [facility]. Madlyn Frankel, RN

## 2019-07-10 NOTE — NC FL2 (Signed)
[place] LEVEL OF CARE SCREENING TOOL     IDENTIFICATION  Patient Name: Kerri Rangel Birthdate: 02-22-1924 Sex: female Admission Date (Current Location): 06/22/2019  [place] and [place] Number:  Engineering geologist and Address:  [facility], [address] [place],  [address]      Provider Number: [id number]  Attending Physician Name and Address:  Caren Griffins, MD  Relative Name and Phone Number:  Lavonna Rua [phone]    Current Level of Care: Hospital Recommended Level of Care: [place]   Prior Approval Number:    Date Approved/Denied:   PASRR Number:   Discharge Plan: [place]  [facility]     Current Diagnoses: Primary: Dementia  Patient Active Problem List   Diagnosis Date Noted  . Essential hypertension 06/28/2019  . Stroke ([place]) 06/23/2019  . CVA (cerebral vascular accident) ([place]) 06/22/2019  . Pressure ulcer, heel 11/08/2017  . Closed left hip fracture ([place]) 09/25/2017  . Carotid artery disease ([place]) 11/06/2016  . Anxiety 08/07/2016  . Depression 08/07/2016  . Hyperlipidemia, unspecified 08/07/2016  . Hypertension 08/07/2016  . Hypothyroidism 08/07/2016  . Vitamin D deficiency   . Dementia ([place])   . Common bile duct (CBD) obstruction 08/01/2016  . Gallstone 08/01/2016  . Late onset Alzheimer's disease with behavioral disturbance ([place]) 03/02/2015  . Dizzy 03/02/2014  . Edema 03/02/2014    Orientation RESPIRATION BLADDER Height & Weight     Self  Normal Incontinent Weight: 150 lb (68 kg) Height:  5\' 5"  (165.1 cm)  BEHAVIORAL SYMPTOMS/MOOD NEUROLOGICAL BOWEL NUTRITION STATUS      Incontinent Diet(Dysphagia 1)  AMBULATORY STATUS COMMUNICATION OF NEEDS Skin   Extensive Assist Verbally Skin abrasions(skin tears and MASD)                       Personal Care Assistance Level of Assistance  Bathing, Feeding, Dressing Bathing Assistance:  Maximum assistance Feeding assistance: Limited assistance Dressing Assistance: Maximum assistance     Functional Limitations Info             SPECIAL CARE FACTORS FREQUENCY  Will be followed by [facility].                   Contractures Contractures Info: Not present    Additional Factors Info  Code Status, Allergies Code Status Info: DNR Allergies Info: No known allergies          Discharge Medications: Please see discharge summary for a list of discharge medications. Medication List    STOP taking these medications   atenolol 50 MG tablet Commonly known as: TENORMIN   donepezil 5 MG tablet Commonly known as: ARICEPT     TAKE these medications   acetaminophen 500 MG tablet Commonly known as: TYLENOL Take 500 mg by mouth every 6 (six) hours as needed for mild pain, fever or headache. What changed: Another medication with the same name was removed. Continue taking this medication, and follow the directions you see here.   aluminum-magnesium hydroxide-simethicone [id number] MG/5ML Susp Commonly known as: MAALOX Take 30 mLs by mouth every 6 (six) hours as needed (for heartburn and indigestion).   aspirin 81 MG EC tablet Take 1 tablet (81 mg total) by mouth daily.   atorvastatin 40 MG tablet Commonly known as: LIPITOR Take 1 tablet (40 mg total) by mouth daily at 6 PM.   busPIRone 15 MG tablet Commonly known as: BUSPAR Take  15 mg by mouth 2 (two) times daily.   clopidogrel 75 MG tablet Commonly known as: PLAVIX Take 1 tablet (75 mg total) by mouth daily.   docusate sodium 100 MG capsule Commonly known as: COLACE Take 1 capsule (100 mg total) by mouth 2 (two) times daily.   fentaNYL 12 MCG/HR Commonly known as: DURAGESIC Place 1 patch (12.5 mcg total) onto the skin every 3 (three) days.   ferrous sulfate 325 (65 FE) MG tablet Take 325 mg by mouth daily with breakfast.   hydrALAZINE 10 MG tablet Commonly known as:  APRESOLINE Take 0.5 tablets (5 mg total) by mouth every 8 (eight) hours.   levothyroxine 50 MCG tablet Commonly known as: SYNTHROID Take 50 mcg by mouth daily before breakfast.   loperamide 2 MG capsule Commonly known as: IMODIUM Take 2 mg by mouth as needed for diarrhea or loose stools.   LORazepam 0.5 MG tablet Commonly known as: ATIVAN Take 1 tablet (0.5 mg total) by mouth daily as needed for anxiety.   magnesium hydroxide 400 MG/5ML suspension Commonly known as: MILK OF MAGNESIA Take 30 mLs by mouth at bedtime as needed for mild constipation or moderate constipation.   mirtazapine 30 MG tablet Commonly known as: REMERON Take 30 mg by mouth at bedtime.   neomycin-bacitracin-polymyxin [id number] ointment Apply 1 application topically as needed (for wound care).   pantoprazole 40 MG tablet Commonly known as: PROTONIX Take 40 mg by mouth daily.   saccharomyces boulardii 250 MG capsule Commonly known as: FLORASTOR Take 250 mg by mouth 2 (two) times daily.   sertraline 25 MG tablet Commonly known as: ZOLOFT Take 25 mg by mouth daily.   vitamin B-12 1000 MCG tablet Commonly known as: CYANOCOBALAMIN Take 1,000 mcg by mouth daily.   Relevant Imaging Results: Relevant Lab Results: Additional Information SS# [id number]  Synthia Fairbank, Veronia Beets, [place]

## 2019-07-10 NOTE — Progress Notes (Signed)
Telephone call to patient's daughter Lavonna Rua to confirm her continued interest in hospice services at [facility] for her mother. She was appreciative of call and voiced understanding that the hospice RN would be contacting her this evening to set up the admission. Discharge summary faxed to referral. Flo Shanks BSN, RN, [place] [phone]

## 2019-07-10 NOTE — TOC Transition Note (Signed)
Transition of Care [facility]) - CM/SW Discharge Note   Patient Details  Name: Kerri Rangel MRN: [id number] Date of Birth: 1924/04/10  [facility] CM/SW Contact:  Sophonie Goforth, Lenice Llamas Phone Number: [phone]  07/10/2019, 2:51 PM   Clinical Narrative: Patient is medically stable for D/C back to [place] with [facility]. [facility] arranged for hospital bed to be delivered to ALF today. Per Theadora Rama med tech at [facility] patient can return today and hospital bed has been delivered. RN will call report and arrange EMS for transport. Clinical Education officer, museum (CSW) sent D/C summary, FL2 and negative covid test results from 11/12 to [facility]. Sandi Raveling hospice liaison is aware of above. Patient's daughter [name] is aware of above. Please reconsult if future social work needs arise. CSW signing off.       Final next level of care: Assisted Living Barriers to Discharge: Barriers Resolved   Patient Goals and CMS Choice Patient states their goals for this hospitalization and ongoing recovery are:: Patient's daughter would like for the patient to regain her mobility in her left arm and maybe be able to walk again at some point CMS Medicare.gov Compare Post Acute Care list provided to:: Patient Represenative (must comment)(daughter [name]) Choice offered to / list presented to : [place] / [place]  Discharge Placement              Patient chooses bed at: Other - please specify in the comment section below:( House [facility]) Patient to be transferred to facility by: [facility] Name of family member notified: Patient's daughter [name] is aware of D/C today. Patient and family notified of of transfer: 07/10/19  Discharge Plan and Services   Discharge Planning Services: CM Consult            DME Arranged: Hospital bed DME Agency: Other - Comment([facility]) Date DME Agency  Contacted: 07/10/19 Time DME Agency Contacted: 1200 Representative spoke with at [place]: [place] ([place]) Interventions     Readmission Risk Interventions No flowsheet data found.

## 2019-07-13 DIAGNOSIS — R627 Adult failure to thrive: Secondary | ICD-10-CM | POA: Diagnosis not present

## 2019-07-13 DIAGNOSIS — F0390 Unspecified dementia without behavioral disturbance: Secondary | ICD-10-CM | POA: Diagnosis not present

## 2019-07-13 DIAGNOSIS — I10 Essential (primary) hypertension: Secondary | ICD-10-CM | POA: Diagnosis not present

## 2019-07-13 DIAGNOSIS — I6330 Cerebral infarction due to thrombosis of unspecified cerebral artery: Secondary | ICD-10-CM | POA: Diagnosis not present

## 2019-07-15 DIAGNOSIS — R269 Unspecified abnormalities of gait and mobility: Secondary | ICD-10-CM | POA: Diagnosis not present

## 2019-07-15 DIAGNOSIS — F0391 Unspecified dementia with behavioral disturbance: Secondary | ICD-10-CM | POA: Diagnosis not present

## 2019-07-15 DIAGNOSIS — G894 Chronic pain syndrome: Secondary | ICD-10-CM | POA: Diagnosis not present

## 2019-07-15 DIAGNOSIS — Z79899 Other long term (current) drug therapy: Secondary | ICD-10-CM | POA: Diagnosis not present

## 2019-07-15 DIAGNOSIS — I10 Essential (primary) hypertension: Secondary | ICD-10-CM | POA: Diagnosis not present

## 2019-07-15 DIAGNOSIS — I6930 Unspecified sequelae of cerebral infarction: Secondary | ICD-10-CM | POA: Diagnosis not present

## 2019-07-20 DIAGNOSIS — G894 Chronic pain syndrome: Secondary | ICD-10-CM | POA: Diagnosis not present

## 2019-07-20 DIAGNOSIS — E039 Hypothyroidism, unspecified: Secondary | ICD-10-CM | POA: Diagnosis not present

## 2019-07-20 DIAGNOSIS — I679 Cerebrovascular disease, unspecified: Secondary | ICD-10-CM | POA: Diagnosis not present

## 2019-07-20 DIAGNOSIS — Z79899 Other long term (current) drug therapy: Secondary | ICD-10-CM | POA: Diagnosis not present

## 2019-07-20 DIAGNOSIS — F419 Anxiety disorder, unspecified: Secondary | ICD-10-CM | POA: Diagnosis not present

## 2019-07-20 DIAGNOSIS — F0391 Unspecified dementia with behavioral disturbance: Secondary | ICD-10-CM | POA: Diagnosis not present

## 2019-07-20 DIAGNOSIS — R269 Unspecified abnormalities of gait and mobility: Secondary | ICD-10-CM | POA: Diagnosis not present

## 2019-07-27 DIAGNOSIS — R451 Restlessness and agitation: Secondary | ICD-10-CM | POA: Diagnosis not present

## 2019-07-27 DIAGNOSIS — F419 Anxiety disorder, unspecified: Secondary | ICD-10-CM | POA: Diagnosis not present

## 2019-07-27 DIAGNOSIS — R269 Unspecified abnormalities of gait and mobility: Secondary | ICD-10-CM | POA: Diagnosis not present

## 2019-07-27 DIAGNOSIS — F0391 Unspecified dementia with behavioral disturbance: Secondary | ICD-10-CM | POA: Diagnosis not present

## 2019-07-27 DIAGNOSIS — G894 Chronic pain syndrome: Secondary | ICD-10-CM | POA: Diagnosis not present

## 2019-07-27 DIAGNOSIS — I10 Essential (primary) hypertension: Secondary | ICD-10-CM | POA: Diagnosis not present

## 2019-08-06 DIAGNOSIS — I10 Essential (primary) hypertension: Secondary | ICD-10-CM | POA: Diagnosis not present

## 2019-08-13 DIAGNOSIS — I10 Essential (primary) hypertension: Secondary | ICD-10-CM | POA: Diagnosis not present

## 2019-08-13 DIAGNOSIS — F0391 Unspecified dementia with behavioral disturbance: Secondary | ICD-10-CM | POA: Diagnosis not present

## 2019-08-13 DIAGNOSIS — R451 Restlessness and agitation: Secondary | ICD-10-CM | POA: Diagnosis not present

## 2019-08-13 DIAGNOSIS — Z7401 Bed confinement status: Secondary | ICD-10-CM | POA: Diagnosis not present

## 2019-08-13 DIAGNOSIS — G894 Chronic pain syndrome: Secondary | ICD-10-CM | POA: Diagnosis not present

## 2019-08-24 DIAGNOSIS — G894 Chronic pain syndrome: Secondary | ICD-10-CM | POA: Diagnosis not present

## 2019-08-24 DIAGNOSIS — B370 Candidal stomatitis: Secondary | ICD-10-CM | POA: Diagnosis not present

## 2019-08-24 DIAGNOSIS — I69998 Other sequelae following unspecified cerebrovascular disease: Secondary | ICD-10-CM | POA: Diagnosis not present

## 2019-08-24 DIAGNOSIS — F0151 Vascular dementia with behavioral disturbance: Secondary | ICD-10-CM | POA: Diagnosis not present

## 2019-08-24 DIAGNOSIS — I10 Essential (primary) hypertension: Secondary | ICD-10-CM | POA: Diagnosis not present

## 2019-09-28 DEATH — deceased

## 2020-07-08 IMAGING — US US CAROTID DUPLEX BILAT
1 series · 13 of 24 positions shown · non-contrast
Comparison: Prior carotid duplex ultrasound 05/27/2008

CLINICAL DATA: [AGE] female with new large right posterior
MCA territory infarct

EXAM:
BILATERAL CAROTID DUPLEX ULTRASOUND
TECHNIQUE: Gray scale imaging, color Doppler and duplex ultrasound were
performed of bilateral carotid and vertebral arteries in the neck.

[Series 1: us carotid duplex bilat · 13 of 65 slices shown]
[im 1/65]
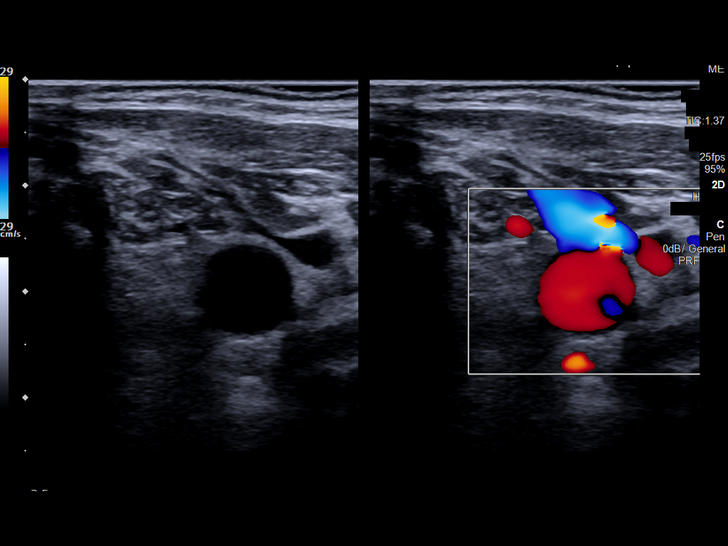
[im 6/65]
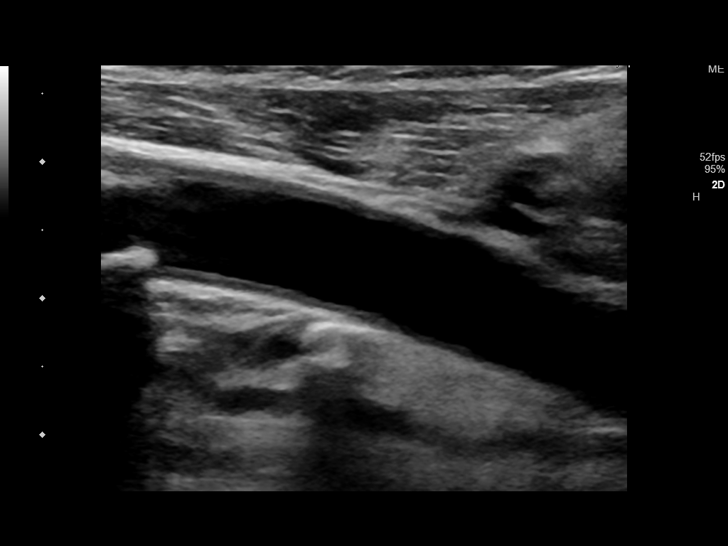
[im 12/65]
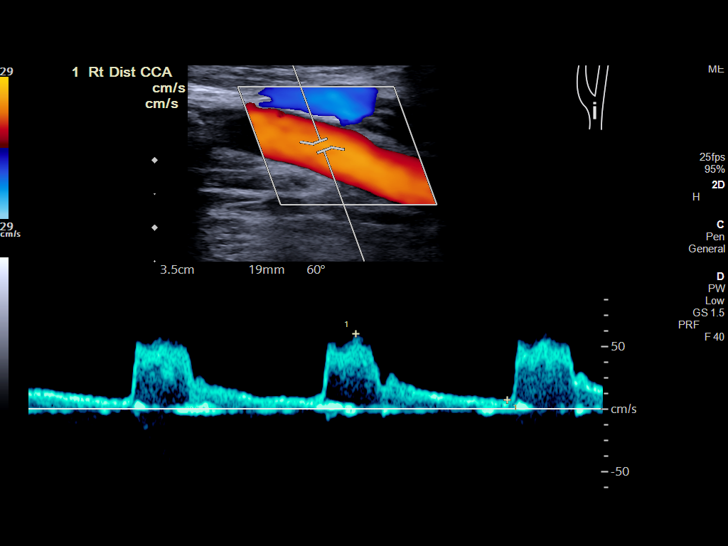
[im 17/65]
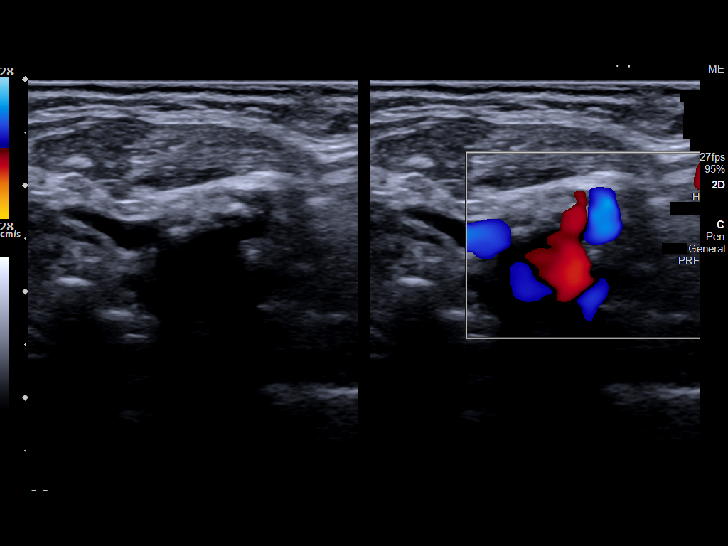
[im 23/65]
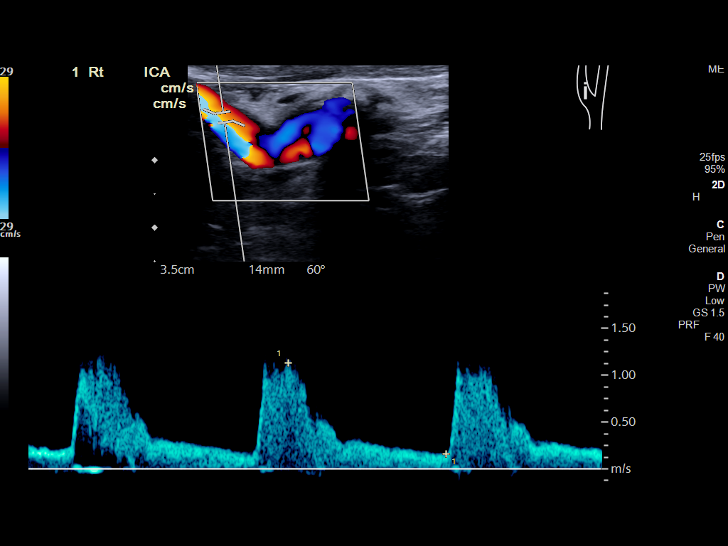
[im 28/65]
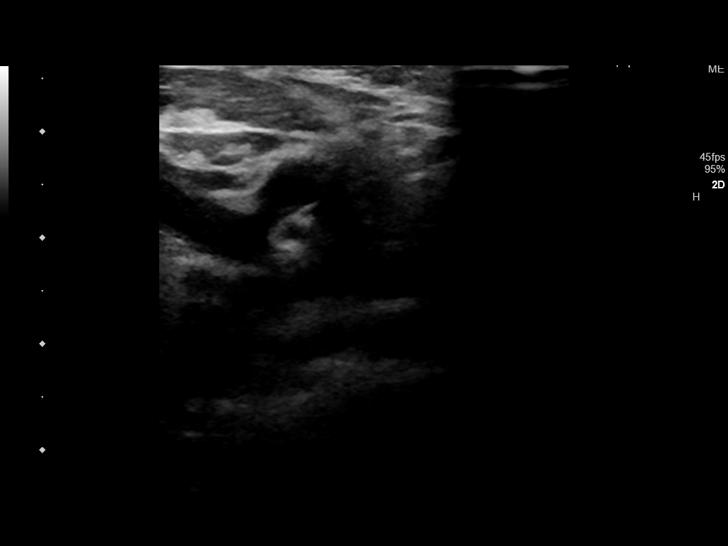
[im 34/65]
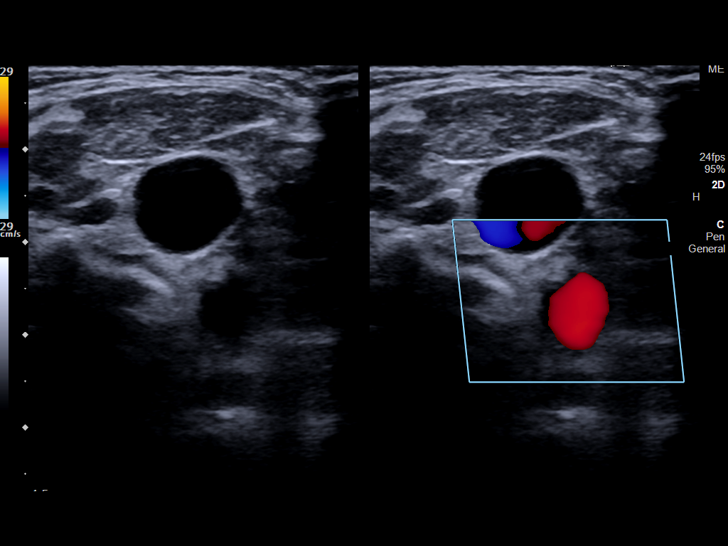
[im 37/65]
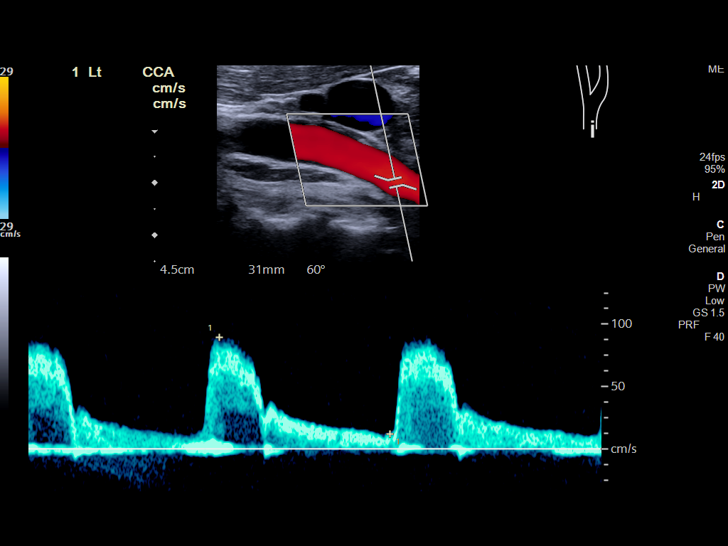
[im 42/65]
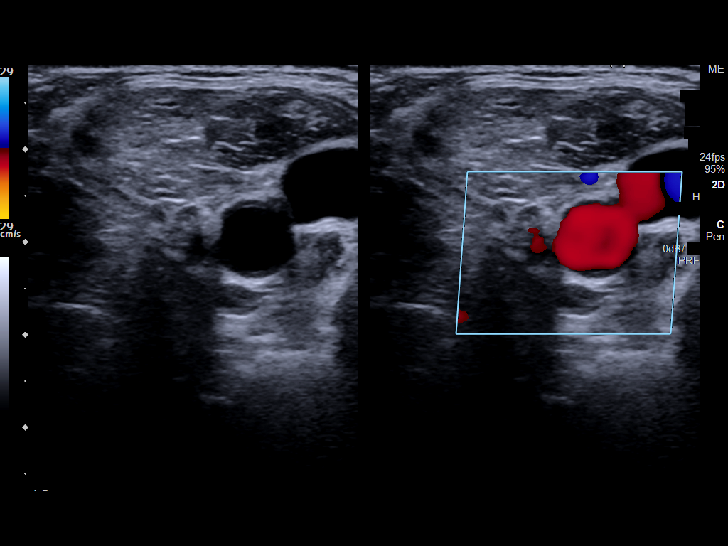
[im 48/65]
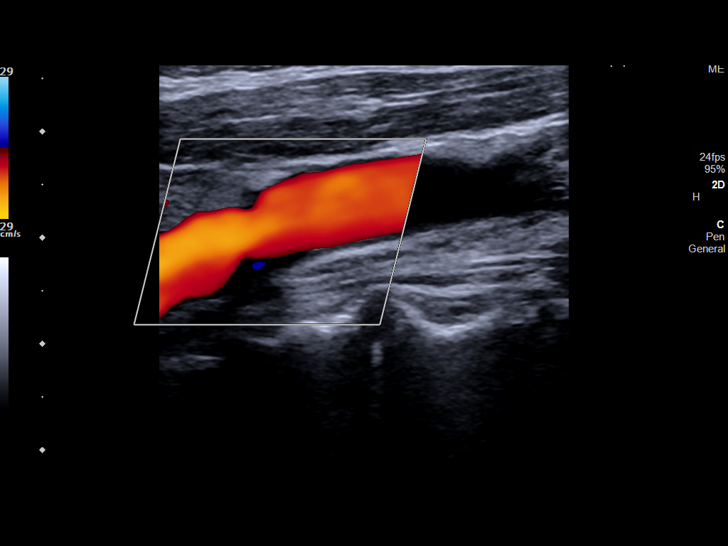
[im 53/65]
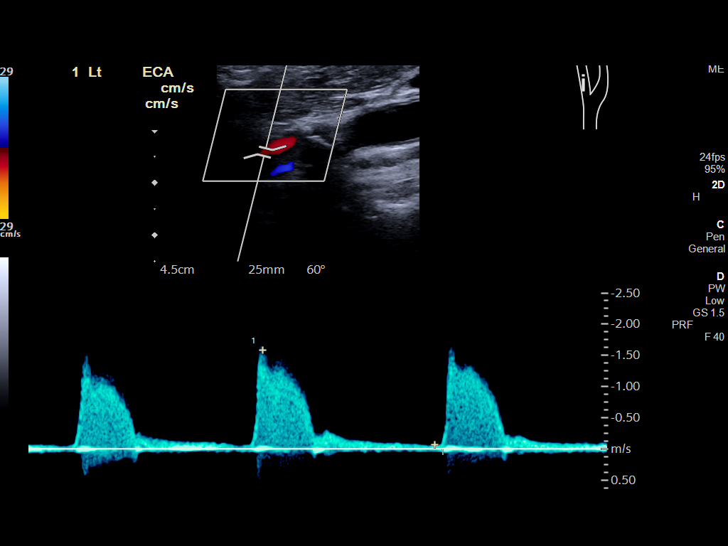
[im 59/65]
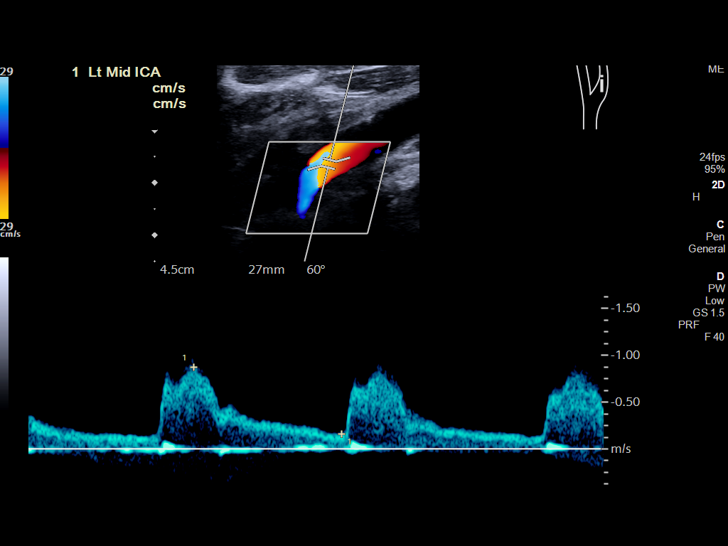
[im 65/65]
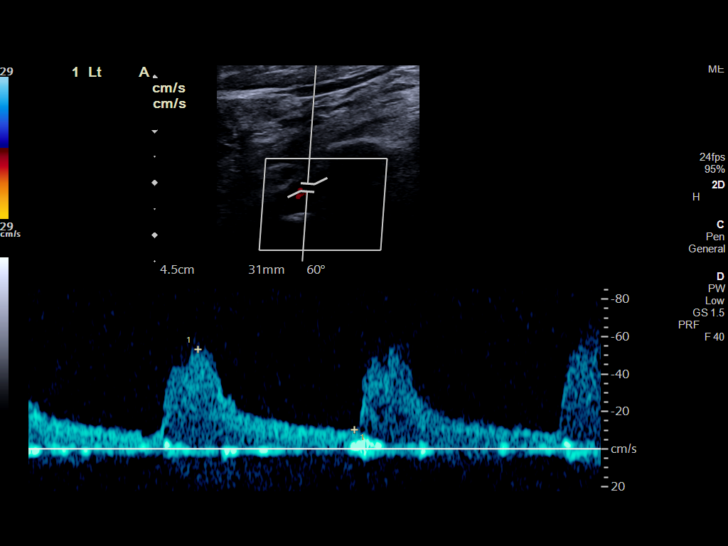

[13 of 24 positions shown; findings below may reference images not displayed]

FINDINGS: Criteria: Quantification of carotid stenosis is based on velocity
parameters that correlate the residual internal carotid diameter
with NASCET-based stenosis levels, using the diameter of the distal
internal carotid lumen as the denominator for stenosis measurement.

The following velocity measurements were obtained:

RIGHT
ICA: 160/23 cm/sec
CCA: 60/7 cm/sec

SYSTOLIC ICA/CCA RATIO:

ECA:  211 cm/sec

LEFT

ICA: 149/24 cm/sec

CCA: 80/11 cm/sec

SYSTOLIC ICA/CCA RATIO:

ECA:  158 cm/sec

RIGHT CAROTID ARTERY: Mild heterogeneous atherosclerotic plaque in
the proximal internal carotid artery. By peak systolic velocity
criteria, there is less than 50% stenosis in the region of
atherosclerotic plaque. The remainder of the internal carotid artery
is tortuous resulting in spuriously elevated velocities.

RIGHT VERTEBRAL ARTERY: Early systolic deceleration consistent with
incomplete steal.

LEFT CAROTID ARTERY: Trace heterogeneous atherosclerotic plaque in
the proximal internal carotid artery. By peak systolic velocity
criteria, the estimated stenosis remains less than 50%.

LEFT VERTEBRAL ARTERY:  Patent with normal antegrade flow.
IMPRESSION: 1. Mild (1-49%) stenosis proximal right internal carotid artery
secondary to heterogenous atherosclerotic plaque.
2. Mild (1-49%) stenosis proximal left internal carotid artery
secondary to heterogenous atherosclerotic plaque.
3. Incomplete steal in the right vertebral artery suggests a
stenosis in the more proximal subclavian or brachiocephalic
arteries.
4. Normal antegrade flow in the left vertebral artery.
